# Patient Record
Sex: Female | Born: 1970 | Race: White | Hispanic: No | Marital: Married | Smoking: Never smoker
Health system: Southern US, Community
[De-identification: ages and names within clinical notes are randomized; demographics above are authoritative.]

## PROBLEM LIST (undated history)

## (undated) DIAGNOSIS — R3129 Other microscopic hematuria: Secondary | ICD-10-CM

## (undated) DIAGNOSIS — E7800 Pure hypercholesterolemia, unspecified: Secondary | ICD-10-CM

## (undated) DIAGNOSIS — I10 Essential (primary) hypertension: Secondary | ICD-10-CM

## (undated) DIAGNOSIS — R87619 Unspecified abnormal cytological findings in specimens from cervix uteri: Secondary | ICD-10-CM

## (undated) DIAGNOSIS — IMO0002 Reserved for concepts with insufficient information to code with codable children: Secondary | ICD-10-CM

## (undated) DIAGNOSIS — N029 Recurrent and persistent hematuria with unspecified morphologic changes: Secondary | ICD-10-CM

## (undated) HISTORY — PX: TUBAL LIGATION: SHX77

## (undated) HISTORY — PX: TONSILLECTOMY: SUR1361

## (undated) HISTORY — DX: Pure hypercholesterolemia, unspecified: E78.00

## (undated) HISTORY — DX: Unspecified abnormal cytological findings in specimens from cervix uteri: R87.619

## (undated) HISTORY — DX: Reserved for concepts with insufficient information to code with codable children: IMO0002

## (undated) HISTORY — DX: Essential (primary) hypertension: I10

## (undated) HISTORY — DX: Other microscopic hematuria: R31.29

## (undated) HISTORY — DX: Recurrent and persistent hematuria with unspecified morphologic changes: N02.9

---

## 1998-09-09 ENCOUNTER — Inpatient Hospital Stay (HOSPITAL_COMMUNITY): Admission: RE | Admit: 1998-09-09 | Discharge: 1998-09-11 | Payer: Self-pay | Admitting: *Deleted

## 1999-08-02 ENCOUNTER — Ambulatory Visit (HOSPITAL_COMMUNITY): Admission: RE | Admit: 1999-08-02 | Discharge: 1999-08-02 | Payer: Self-pay | Admitting: Gastroenterology

## 2000-03-14 ENCOUNTER — Encounter: Payer: Self-pay | Admitting: Orthopedic Surgery

## 2000-03-14 ENCOUNTER — Ambulatory Visit (HOSPITAL_COMMUNITY): Admission: RE | Admit: 2000-03-14 | Discharge: 2000-03-14 | Payer: Self-pay | Admitting: Orthopedic Surgery

## 2001-10-04 ENCOUNTER — Inpatient Hospital Stay (HOSPITAL_COMMUNITY): Admission: AD | Admit: 2001-10-04 | Discharge: 2001-10-04 | Payer: Self-pay | Admitting: Obstetrics and Gynecology

## 2001-10-04 ENCOUNTER — Encounter: Payer: Self-pay | Admitting: Obstetrics and Gynecology

## 2004-06-02 ENCOUNTER — Ambulatory Visit: Payer: Self-pay | Admitting: Internal Medicine

## 2004-09-06 ENCOUNTER — Ambulatory Visit (HOSPITAL_COMMUNITY): Admission: RE | Admit: 2004-09-06 | Discharge: 2004-09-06 | Payer: Self-pay | Admitting: Gastroenterology

## 2004-11-14 ENCOUNTER — Ambulatory Visit: Payer: Self-pay | Admitting: Internal Medicine

## 2004-12-06 ENCOUNTER — Ambulatory Visit: Payer: Self-pay | Admitting: Internal Medicine

## 2004-12-28 ENCOUNTER — Ambulatory Visit: Payer: Self-pay | Admitting: Internal Medicine

## 2006-08-14 ENCOUNTER — Ambulatory Visit: Payer: Self-pay | Admitting: Internal Medicine

## 2006-09-26 ENCOUNTER — Encounter: Payer: Self-pay | Admitting: Internal Medicine

## 2006-09-28 ENCOUNTER — Encounter: Payer: Self-pay | Admitting: Internal Medicine

## 2006-10-24 ENCOUNTER — Ambulatory Visit: Payer: Self-pay | Admitting: Internal Medicine

## 2006-10-29 ENCOUNTER — Encounter: Payer: Self-pay | Admitting: Internal Medicine

## 2006-11-28 ENCOUNTER — Encounter: Payer: Self-pay | Admitting: Internal Medicine

## 2007-11-03 ENCOUNTER — Encounter: Admission: RE | Admit: 2007-11-03 | Discharge: 2007-11-03 | Payer: Self-pay | Admitting: Gastroenterology

## 2008-08-17 ENCOUNTER — Ambulatory Visit: Payer: Self-pay | Admitting: Internal Medicine

## 2009-10-03 ENCOUNTER — Encounter: Payer: Self-pay | Admitting: Physician Assistant

## 2009-10-28 ENCOUNTER — Encounter: Payer: Self-pay | Admitting: Physician Assistant

## 2009-11-23 ENCOUNTER — Ambulatory Visit: Payer: Self-pay | Admitting: Nephrology

## 2010-12-15 NOTE — Op Note (Signed)
NAMECAROLE, Kathleen Espinoza           ACCOUNT NO.:  [id number]   MEDICAL RECORD NO.:  [id number]          PATIENT TYPE:  AMB   LOCATION:  ENDO                         FACILITY:  [facility]   PHYSICIAN:  Nelwyn Salisbury, M.D.  DATE OF BIRTH:  March 07, 1971   DATE OF PROCEDURE:  09/07/2003  DATE OF DISCHARGE:                                 OPERATIVE REPORT   PROCEDURE PERFORMED:  Screening colonoscopy.   ENDOSCOPIST:  Nelwyn Salisbury, M.D.   INSTRUMENT USED:  Olympus video colonoscope.   INDICATIONS FOR PROCEDURE:  A 40 year old white female with a family history  of colon cancer in her mother and maternal grandfather, undergoing a  screening colonoscopy to rule out colonic polyps, masses, etc.   PREPROCEDURE PREPARATION:  Informed consent was procured from the patient.  The patient was fasted for 8 hours prior to the procedure and prepped with a  bottle of magnesium citrate and a gallon of GoLYTELY the night prior to the  procedure.   PREPROCEDURE PHYSICAL:  VITAL SIGNS:  The patient with stable vital signs.  NECK:  Supple.  CHEST:  Clear to auscultation.  CARDIAC:  S1, S2, regular.  No murmur, rub, or gallop.  LUNGS:  No rhonchi or wheezing.  ABDOMEN:  Soft with normal bowel sounds.   DESCRIPTION OF PROCEDURE:  The patient was placed in left lateral decubitus  position, sedated with 100 mg of Demerol and 10 mg of Versed in slow  incremental doses.  Once the patient was adequately sedated and maintained  on low-flow oxygen and continuous cardiac monitoring, the Olympus video  colonoscope was advanced in the rectum, to the cecum with extreme  difficulty.  There was a large amount of residual stool in the colon.  No  masses, polyps, erosions, ulcerations, or diverticula were seen.  The  patient's position was changed from the left lateral decubitus position to  supine and the right lateral position to mobilize the stool pool and  visualize the cecal base.  However, small lesions could  have been missed.  The patient tolerated the procedure well without complications.  Retroflexion in the rectum revealed no abnormalities.   IMPRESSION:  1.  Large amount of residual stool in the colon.  Small lesions could have      been missed.  2.  No masses, polyps, or diverticula seen.   RECOMMENDATIONS:  1.  A repeat colonoscopy has been recommended in the next 3 years with a      better prep.  If the patient has any abdominal symptoms in the interim,      she is to contact the office immediately.  2.  Outpatient follow up as need arises in the future.      JNM/MEDQ  D:  09/06/2004  T:  09/06/2004  Job:  [id number]   cc:   Einar Pheasant  [address]  [place]  [place] [address]  Fax: [phone]

## 2010-12-15 NOTE — Procedures (Signed)
[place]  Patient:    Kathleen Espinoza                     MRN: [id number] Proc. Date: 08/03/99 Adm. Date:  [id number] Attending:  Juanita Craver CC:         Lavera Guise, M.D.                           Procedure Report  DATE OF BIRTH:  07-Nov-1970  REFERRING PHYSICIAN:  Lavera Guise, M.D.  PROCEDURE PERFORMED:  Colonoscopy.  ENDOSCOPIST:  Nelwyn Salisbury, M.D.  INSTRUMENT USED:  Olympus video colonoscope.  INDICATIONS FOR PROCEDURE: Family history of colon cancer in a 40 year old white female whose mother and maternal grandfather died of it at a young age.  PREPROCEDURE PREPARATION:  Informed consent was procured from the patient.  The  patient was fasted for eight hours prior to the procedure and prepped with a bottle of magnesium citrate and a gallon of NuLytely the night prior to the procedure.  PREPROCEDURE PHYSICAL:  The patient had stable vital signs.  Neck supple. Chest clear to auscultation.  S1, S2 regular.  Abdomen soft with normal abdominal bowel sounds.  DESCRIPTION OF PROCEDURE:  The patient was placed in the left lateral decubitus  position and sedated with 100 mg of Demerol and 10 mg of Versed intravenously.  Once the patient was adequately sedated and maintained on low-flow oxygen and continuous cardiac monitoring, the Olympus video colonoscope was advanced from he rectum to the cecum without difficulty.  No masses, polyps, erosions, ulcerations or hemorrhoids were seen.  There was no evidence of diverticular disease.  The patient tolerated the procedure well without complication.  IMPRESSION:  Normal colonoscopy.  RECOMMENDATIONS:  Considering her family history of colon cancer, repeat colonoscopy is recommended in the next five years unless the patient were to develop any symptoms prior to that.DD:  08/03/99 TD:  08/03/99 Job: [id number]

## 2011-11-28 ENCOUNTER — Ambulatory Visit: Payer: Self-pay | Admitting: Internal Medicine

## 2012-01-02 ENCOUNTER — Ambulatory Visit: Payer: Self-pay

## 2012-05-15 ENCOUNTER — Telehealth: Payer: Self-pay | Admitting: Internal Medicine

## 2012-05-15 NOTE — Telephone Encounter (Signed)
Pt dropped off physician wellness screening  Gave to dr Nicki Reaper

## 2012-05-16 ENCOUNTER — Encounter: Payer: Self-pay | Admitting: Internal Medicine

## 2012-05-16 ENCOUNTER — Telehealth: Payer: Self-pay | Admitting: Internal Medicine

## 2012-05-16 ENCOUNTER — Ambulatory Visit (INDEPENDENT_AMBULATORY_CARE_PROVIDER_SITE_OTHER): Payer: 59 | Admitting: Internal Medicine

## 2012-05-16 VITALS — BP 130/84 | HR 62 | Temp 98.3°F | Ht 62.0 in | Wt 206.0 lb

## 2012-05-16 DIAGNOSIS — I10 Essential (primary) hypertension: Secondary | ICD-10-CM

## 2012-05-16 NOTE — Telephone Encounter (Signed)
Left message for pt to call office

## 2012-05-16 NOTE — Telephone Encounter (Signed)
Ok to come in then - if can get here by 4:40.  i can give her a [facility] order form then.  thanks

## 2012-05-16 NOTE — Telephone Encounter (Signed)
I received a form to be completed.  I am unable to complete because I need more information.  I do not have the records from [place].  Can she come in today (at 4:15) for an evaluation and I can get height/weight, etc.  Also will be able to schedule her for the required labs.  Let me know if this time is a problem and I will work her in next week.  Thanks.

## 2012-05-16 NOTE — Patient Instructions (Addendum)
It was nice seeing you today.  We will notify you of your lab results once they become available.

## 2012-05-16 NOTE — Telephone Encounter (Signed)
Spoke with pt.  Pt stated she does not get off work till 4:30 and wanted to know if she could come after that.  She could be here @ 4:40.  Also she wanted to see if you could send lab order over to [facility] at the hospital

## 2012-05-16 NOTE — Telephone Encounter (Signed)
Pt will be here @ 4:40 today

## 2012-05-16 NOTE — Telephone Encounter (Signed)
See other message.  I do not have all the information to complete.  Can see if she can come in today 4:15 for evaluation.  Also I can get her scheduled for the required labs.  Thanks. Let me know if this time is a problem and I can work her in next week.

## 2012-05-20 ENCOUNTER — Other Ambulatory Visit: Payer: Self-pay | Admitting: Internal Medicine

## 2012-05-26 DIAGNOSIS — I10 Essential (primary) hypertension: Secondary | ICD-10-CM | POA: Insufficient documentation

## 2012-05-26 NOTE — Progress Notes (Signed)
  Subjective:    [name], female    DOB: 08-28-1970, 41 y.o.   MRN: [id number]  HPI 41 year old female with past history of hypertension who comes in today for a form completion.  Needs an evaluation for her work.  States she has been doing well.  Blood pressure has been doing well.  Working first shift now.  Eating and drinking well.    Review of Systems Patient denies any headache, lightheadedness or dizziness.  No chest pain, tightness or palpitations.  No increased shortness of breath, cough or congestion.  No nausea or vomiting.  No abdominal pain or cramping.  No bowel change, such as diarrhea, constipation, BRBPR or melana.  No urine change.        Objective:   Physical Exam Filed Vitals:   05/16/12 1653  BP: 130/84  Pulse: 62  Temp: 98.3 F (40.42 C)   41 year old female in no acute distress.  NECK:  Supple, nontender.  No audible bruit.   HEART:  Appears to be regular. LUNGS:  Without crackles or wheezing audible.  Respirations even and unlabored.   RADIAL PULSE:  Equal bilaterally.  ABDOMEN:  Soft, nontender.  No audible abdominal bruit.   EXTREMITIES:  No increased edema to be present.                     Assessment & Plan:  FORM COMPLETION.   Obtain labs for her work.  Will need these to complete her form.

## 2012-05-26 NOTE — Assessment & Plan Note (Signed)
Blood pressure has been under good control.  Have her continue to spot check her pressures.  Follow.  Check met b.

## 2012-05-27 ENCOUNTER — Telehealth: Payer: Self-pay | Admitting: *Deleted

## 2012-05-27 LAB — BASIC METABOLIC PANEL WITH GFR
BUN/Creatinine Ratio: 15 (ref 9–23)
BUN: 13 mg/dL (ref 6–24)
CO2: 19 mmol/L (ref 19–28)
Calcium: 9.3 mg/dL (ref 8.7–10.2)
Chloride: 102 mmol/L (ref 97–108)
Creatinine, Ser: 0.87 mg/dL (ref 0.57–1.00)
GFR calc Af Amer: 96 mL/min/1.73
GFR calc non Af Amer: 84 mL/min/1.73
Glucose: 112 mg/dL — ABNORMAL HIGH (ref 65–99)
Potassium: 4.2 mmol/L (ref 3.5–5.2)
Sodium: 138 mmol/L (ref 134–144)

## 2012-05-27 LAB — LIPID PANEL W/O CHOL/HDL RATIO
Cholesterol, Total: 187 mg/dL (ref 100–199)
HDL: 56 mg/dL (ref >39)
LDL Calculated: 105 mg/dL — ABNORMAL HIGH (ref 0–99)
Triglycerides: 131 mg/dL (ref 0–149)
VLDL Cholesterol Cal: 26 mg/dL (ref 5–40)

## 2012-05-27 LAB — NICOTINE/COTININE METABOLITES
Cotinine: 1.3 ng/mL
Nicotine: NOT DETECTED ng/mL

## 2012-05-27 LAB — HGB A1C W/O EAG: Hgb A1c MFr Bld: 5.6 % (ref 4.8–5.6)

## 2012-05-27 NOTE — Telephone Encounter (Signed)
Pt called back and said she talked with [facility] and they said they faxed her labs over sometime last week.

## 2012-05-27 NOTE — Telephone Encounter (Signed)
Called patient at home to check on the progress of her labs. Patient stated that she has not heard anything and that she will call to inquire about them today.

## 2012-05-28 NOTE — Telephone Encounter (Signed)
Pt would like a call back concerning her lab work best number [phone].

## 2012-05-29 ENCOUNTER — Telehealth: Payer: Self-pay | Admitting: *Deleted

## 2012-05-29 NOTE — Telephone Encounter (Signed)
Called patient at home and left message for her to return call.

## 2012-05-29 NOTE — Telephone Encounter (Signed)
Patients husband came by office and picked up patients labs.

## 2012-05-29 NOTE — Telephone Encounter (Signed)
Patients husband dropped by office and picked up patient's lab form.

## 2012-05-29 NOTE — Telephone Encounter (Signed)
Have patient's labs ready. Left message for patient to return call.

## 2012-06-02 ENCOUNTER — Encounter: Payer: Self-pay | Admitting: Internal Medicine

## 2012-06-02 ENCOUNTER — Ambulatory Visit (INDEPENDENT_AMBULATORY_CARE_PROVIDER_SITE_OTHER): Payer: 59 | Admitting: Internal Medicine

## 2012-06-02 VITALS — BP 118/80 | HR 84 | Temp 97.1°F | Ht 64.0 in | Wt 208.0 lb

## 2012-06-02 DIAGNOSIS — M79606 Pain in leg, unspecified: Secondary | ICD-10-CM

## 2012-06-02 DIAGNOSIS — M79609 Pain in unspecified limb: Secondary | ICD-10-CM

## 2012-06-02 DIAGNOSIS — Q619 Cystic kidney disease, unspecified: Secondary | ICD-10-CM

## 2012-06-02 DIAGNOSIS — N281 Cyst of kidney, acquired: Secondary | ICD-10-CM | POA: Insufficient documentation

## 2012-06-02 DIAGNOSIS — I10 Essential (primary) hypertension: Secondary | ICD-10-CM

## 2012-06-02 DIAGNOSIS — R319 Hematuria, unspecified: Secondary | ICD-10-CM | POA: Insufficient documentation

## 2012-06-02 DIAGNOSIS — E7800 Pure hypercholesterolemia, unspecified: Secondary | ICD-10-CM | POA: Insufficient documentation

## 2012-06-02 NOTE — Patient Instructions (Addendum)
It was good seeing you today.  I am sorry your legs have been bothering you.  I am going to check a few labs (inflammatory markers, etc).  We will notify you of the results once they are available.

## 2012-06-02 NOTE — Assessment & Plan Note (Signed)
Followed by nephrology. 

## 2012-06-02 NOTE — Assessment & Plan Note (Signed)
Blood pressure is doing well.  Follow met b.  Same meds.

## 2012-06-02 NOTE — Progress Notes (Signed)
  Subjective:    [name], female    DOB: 1970/09/05, 41 y.o.   MRN: [id number]  HPI 41 year old female with past history of hypertension who comes in today for a scheduled follow up.  She states that she has been having increased pain in her leg.  Present now for a few months.  Started after playing softball.  No known injury or trauma.  The acute pain resolved, but she continues to have what she describes as growing pains.  Bothers her to walk up stairs.  Feels like her legs are gong to give out when she is going down stairs.  Feels more like a muscle ache.  No involvement of her upper extremities.  No fever.  No rash.  No other joint complaints.    Past Medical History  Diagnosis Date  . Hypertension   . Hypercholesterolemia   . Abnormal pap     required freezing  . Microscopic hematuria     s/p renal biopsy  . Thin basement membrane disease     Review of Systems Patient denies any headache, lightheadedness or dizziness.  No chest pain, tightness or palpitations.  No increased shortness of breath, cough or congestion.  No nausea or vomiting.  No abdominal pain or cramping.  No bowel change, such as diarrhea, constipation, BRBPR or melana.  No urine change. Leg aching as outlined.          Objective:   Physical Exam Filed Vitals:   06/02/12 1618  BP: 118/80  Pulse: 84  Temp: 97.1 F (36.80 C)   41 year old female in no acute distress.   HEENT:  Nares - clear.  OP- without lesions or erythema.  NECK:  Supple, nontender.  No audible bruit.   HEART:  Appears to be regular. LUNGS:  Without crackles or wheezing audible.  Respirations even and unlabored.   RADIAL PULSE:  Equal bilaterally.  ABDOMEN:  Soft, nontender.  No audible abdominal bruit.   EXTREMITIES:  No increased edema to be present.  DP pulses palpable and equal bilaterally.  MSK.  No pain with straight leg raise.  No pain to palpation over the muscle.  No rash.  Motor strength appears to be equal bilateral  lower extremities.                    Assessment & Plan:  LEG PAIN.  Symptoms and exam as outlined.  No injury or trauma.  Check ESR and CRP.  Will also check CK.  Further w/up pending results.    HEALTH MAINTENANCE.  Physical 11/21/11.  Colonoscopy 10/31/07 was normal except for extrinsic compression from the cecum.  This was performed by Dr Collene Mares ([place]).  CT revealed no acute findings - no cecal mass.  Mammogram 11/28/11 - BiRADS II. Had a follow up pap 12/27/11.  Obtain results.  (previous pap unsatisfactory - positive HPV).

## 2012-06-02 NOTE — Assessment & Plan Note (Signed)
Low cholesterol diet and exercise.  Follow  Not on any medication.   

## 2012-06-02 NOTE — Assessment & Plan Note (Signed)
Sees nephrology.  Presumably felt to be due to thin basement membrane disease.  Negative renal biopsy.

## 2012-06-03 NOTE — Progress Notes (Signed)
Patients husband came by to pick up.

## 2012-06-05 ENCOUNTER — Telehealth: Payer: Self-pay | Admitting: Internal Medicine

## 2012-06-05 NOTE — Telephone Encounter (Signed)
Pt has to wait until Monday to have her labs but she was wondering if there was anything she could be per scribed to help with the pain in her legs. She uses [facility] on [address].

## 2012-06-05 NOTE — Telephone Encounter (Signed)
I would stay with tylenol arthritis 2 tablets bid.  If this does not work -(pending labs) can try something else.  Avoid antiiflammatories.

## 2012-06-06 NOTE — Telephone Encounter (Signed)
Pt called back and I told her the message that Dr. Nicki Reaper had written.

## 2012-06-10 ENCOUNTER — Other Ambulatory Visit: Payer: Self-pay | Admitting: Internal Medicine

## 2012-06-12 LAB — CK: Total CK: 61 U/L (ref 24–173)

## 2012-06-12 LAB — BASIC METABOLIC PANEL
BUN: 12 mg/dL (ref 6–24)
CO2: 26 mmol/L (ref 19–28)
Calcium: 9.3 mg/dL (ref 8.7–10.2)
Chloride: 101 mmol/L (ref 97–108)
Creatinine, Ser: 0.85 mg/dL (ref 0.57–1.00)
Glucose: 117 mg/dL — ABNORMAL HIGH (ref 65–99)

## 2012-06-12 LAB — CBC WITH DIFFERENTIAL
Basophils Absolute: 0.1 10*3/uL (ref 0.0–0.2)
Eos: 2 % (ref 0–5)
HCT: 41.2 % (ref 34.0–46.6)
Immature Granulocytes: 0 % (ref 0–2)
Lymphs: 27 % (ref 14–46)
MCV: 86 fL (ref 79–97)
Monocytes: 9 % (ref 4–12)
Platelets: 310 10*3/uL (ref 155–379)
RBC: 4.82 x10E6/uL (ref 3.77–5.28)
RDW: 12.6 % (ref 12.3–15.4)
WBC: 8.6 10*3/uL (ref 3.4–10.8)

## 2012-06-12 LAB — SEDIMENTATION RATE: Sed Rate: 7 mm/hr (ref 0–32)

## 2012-06-12 LAB — HEPATIC FUNCTION PANEL
Albumin: 4 g/dL (ref 3.5–5.5)
Total Bilirubin: 0.4 mg/dL (ref 0.0–1.2)
Total Protein: 6.6 g/dL (ref 6.0–8.5)

## 2012-06-12 LAB — C-REACTIVE PROTEIN: CRP: 8.6 mg/L — ABNORMAL HIGH (ref 0.0–4.9)

## 2012-06-13 ENCOUNTER — Telehealth: Payer: Self-pay | Admitting: Internal Medicine

## 2012-06-13 NOTE — Telephone Encounter (Signed)
Pt would like a call back from a nurse to discuss lab results

## 2012-06-13 NOTE — Telephone Encounter (Signed)
Patient called and is concerned about CRP levels. (8.6) I explained that you had already sent her the results for her labs, But she wanted to check with you to make sure everything was all right.

## 2012-06-13 NOTE — Telephone Encounter (Signed)
Called pt.  Left message on her voice mail with information about CRP and again informed her that I wanted to refer her to rheumatology for evaluation.  Left message for her to call back and let me know if she is agreeable.

## 2012-06-13 NOTE — Telephone Encounter (Signed)
Labs reviewed.  Message sent to pt with results.

## 2012-06-17 ENCOUNTER — Telehealth: Payer: Self-pay | Admitting: Internal Medicine

## 2012-06-17 NOTE — Telephone Encounter (Signed)
Patient wants to go to be referred to a rheumatology ,but not [facility]. She would like to go to [facility].

## 2012-06-17 NOTE — Telephone Encounter (Signed)
Does she have a name of someone she would like to see at [facility].  I do not know the rheumatologist there.  Let me know and I can make the referral.

## 2012-06-18 NOTE — Telephone Encounter (Signed)
The patient called back and it does not matter who she is referred to as long as it's not Dr. Jefm Bryant.

## 2012-06-18 NOTE — Telephone Encounter (Signed)
Please refer pt to Dr Hardie [facility] rheumatology) - phone [phone].  She is having continued leg pain, aching and elevated CRP.  Thanks.

## 2012-06-20 NOTE — Telephone Encounter (Signed)
They require the Dr.'s office to fax over notes and they review before scheduling the patient. I have faxed the notes and demographic information to  [phone].

## 2012-06-20 NOTE — Telephone Encounter (Signed)
Noted.  Please notify pt we have faxed over records and waiting to hear about appt.  Tell her to let us know if she does not hear.

## 2012-06-20 NOTE — Telephone Encounter (Signed)
Called patient to let her know. She said would give them a few days before she called back.

## 2012-07-04 ENCOUNTER — Telehealth: Payer: Self-pay | Admitting: Internal Medicine

## 2012-07-04 NOTE — Telephone Encounter (Signed)
Refill request for lisino-hctz 20-12.5 Tab Qty: 30 Sig: take one tablet by mouth every day

## 2012-07-10 MED ORDER — LISINOPRIL-HYDROCHLOROTHIAZIDE 20-12.5 MG PO TABS: 1.0000 | ORAL_TABLET | Freq: Every day | ORAL | Status: DC

## 2012-07-10 NOTE — Telephone Encounter (Signed)
Sent in to pharmacy.  

## 2012-07-16 ENCOUNTER — Ambulatory Visit: Payer: Self-pay | Admitting: Internal Medicine

## 2012-07-31 ENCOUNTER — Telehealth: Payer: Self-pay | Admitting: Internal Medicine

## 2012-07-31 NOTE — Telephone Encounter (Signed)
Pt coming tomorrow for repeat pap.

## 2012-07-31 NOTE — Telephone Encounter (Signed)
Please advice? She has appointment on 1/3 and also on 1/22

## 2012-07-31 NOTE — Telephone Encounter (Signed)
Patient is wanting to know why she has to follow up with the Dr. About old pap results.

## 2012-08-01 ENCOUNTER — Encounter: Payer: Self-pay | Admitting: Internal Medicine

## 2012-08-01 ENCOUNTER — Ambulatory Visit (INDEPENDENT_AMBULATORY_CARE_PROVIDER_SITE_OTHER): Payer: 59 | Admitting: Internal Medicine

## 2012-08-01 ENCOUNTER — Other Ambulatory Visit (HOSPITAL_COMMUNITY)
Admission: RE | Admit: 2012-08-01 | Discharge: 2012-08-01 | Disposition: A | Discharge status: Home / Self Care | Payer: 59 | Source: Ambulatory Visit | Attending: Internal Medicine | Admitting: Internal Medicine

## 2012-08-01 VITALS — BP 112/70 | HR 89 | Temp 98.8°F | Ht 64.0 in | Wt 217.8 lb

## 2012-08-01 DIAGNOSIS — Z1151 Encounter for screening for human papillomavirus (HPV): Secondary | ICD-10-CM | POA: Insufficient documentation

## 2012-08-01 DIAGNOSIS — Z139 Encounter for screening, unspecified: Secondary | ICD-10-CM

## 2012-08-01 DIAGNOSIS — Z01419 Encounter for gynecological examination (general) (routine) without abnormal findings: Secondary | ICD-10-CM | POA: Insufficient documentation

## 2012-08-01 DIAGNOSIS — I10 Essential (primary) hypertension: Secondary | ICD-10-CM

## 2012-08-03 ENCOUNTER — Encounter: Payer: Self-pay | Admitting: Internal Medicine

## 2012-08-03 NOTE — Assessment & Plan Note (Signed)
Blood pressure under good control.  Same medication regimen.  Follow.

## 2012-08-03 NOTE — Progress Notes (Signed)
  Subjective:    [name], female    DOB: 03-24-1971, 42 y.o.   MRN: [id number]  HPI 42 year old female with past history of hypertension who comes in today for a scheduled follow up.  She is here for a follow up pap smear.  Pap 11/21/11 unsatisfactory with positive HPV.  Repeat pap 12/25/11 revealed pap negative with positive HPV.  She returns today for a follow up HPV.  She states she has been doing relatively well.  Still with increased joint complaints.  Planning to see rheumatology 08/08/12.  She is having regular periods, but states she has noticed now that when she her period is complete - she will still have some brown discharge.  Period ended one week ago.  Blood pressure has been doing well.     Past Medical History  Diagnosis Date  . Hypertension   . Hypercholesterolemia   . Abnormal pap     required freezing  . Microscopic hematuria     s/p renal biopsy  . Thin basement membrane disease     Current Outpatient Prescriptions on File Prior to Visit  Medication Sig Dispense Refill  . lisinopril-hydrochlorothiazide (PRINZIDE,ZESTORETIC) 20-12.5 MG per tablet Take 1 tablet by mouth daily.  30 tablet  2    Review of Systems Patient denies any headache, lightheadedness or dizziness.  No chest pain, tightness or palpitations.  No increased shortness of breath, cough or congestion.  No nausea or vomiting.  No abdominal pain or cramping.  No bowel change, such as diarrhea, constipation, BRBPR or melana.  No urine change. Leg aching as outlined.          Objective:   Physical Exam  Filed Vitals:   08/01/12 1629  BP: 112/70  Pulse: 89  Temp: 98.8 F (55.26 C)   42 year old female in no acute distress.   HEENT:  Nares - clear.  OP- without lesions or erythema.  NECK:  Supple, nontender.  No audible bruit.   HEART:  Appears to be regular. LUNGS:  Without crackles or wheezing audible.  Respirations even and unlabored.   RADIAL PULSE:  Equal bilaterally.  ABDOMEN:   Soft, nontender.  No audible abdominal bruit.  GU:  Normal external genitalia.  Vaginal vault without lesions.  Some brown discharge present.  Cervix identified - no lesions.  Pap performed.  Could not appreciate any adnexal masses or tenderness.    EXTREMITIES:  No increased edema to be present.                  Assessment & Plan:  MSK.  Joint pain and stiffness.  Planning to see rheumatology 08/08/12.    ABNORMAL PAP.  Repeated today.  Last pap - positive HPV.     HEALTH MAINTENANCE.  Physical 11/21/11.  Colonoscopy 10/31/07 was normal except for extrinsic compression from the cecum.  This was performed by Dr Collene Mares ([place]).  CT revealed no acute findings - no cecal mass.  Mammogram 11/28/11 - BiRADS II.  Repeat pap today.

## 2012-08-06 ENCOUNTER — Telehealth: Payer: Self-pay | Admitting: Internal Medicine

## 2012-08-06 NOTE — Telephone Encounter (Signed)
Pt notified via my chart - pap ok.  HPV negative.

## 2012-08-19 ENCOUNTER — Encounter: Payer: Self-pay | Admitting: Internal Medicine

## 2012-08-20 ENCOUNTER — Ambulatory Visit: Payer: 59 | Admitting: Internal Medicine

## 2012-09-13 ENCOUNTER — Other Ambulatory Visit: Payer: Self-pay

## 2012-11-21 ENCOUNTER — Other Ambulatory Visit: Payer: Self-pay | Admitting: *Deleted

## 2012-11-21 MED ORDER — LISINOPRIL-HYDROCHLOROTHIAZIDE 20-12.5 MG PO TABS: 1.0000 | ORAL_TABLET | Freq: Every day | ORAL | Status: DC

## 2013-01-15 ENCOUNTER — Ambulatory Visit: Payer: Self-pay | Admitting: Internal Medicine

## 2013-02-03 ENCOUNTER — Encounter: Payer: Self-pay | Admitting: Internal Medicine

## 2013-02-13 ENCOUNTER — Encounter: Payer: Self-pay | Admitting: Internal Medicine

## 2013-03-16 ENCOUNTER — Encounter: Payer: Self-pay | Admitting: Internal Medicine

## 2013-03-16 ENCOUNTER — Ambulatory Visit (INDEPENDENT_AMBULATORY_CARE_PROVIDER_SITE_OTHER): Payer: 59 | Admitting: Internal Medicine

## 2013-03-16 VITALS — BP 100/60 | HR 78 | Temp 98.8°F | Ht 64.0 in | Wt 229.5 lb

## 2013-03-16 DIAGNOSIS — N760 Acute vaginitis: Secondary | ICD-10-CM

## 2013-03-16 DIAGNOSIS — N390 Urinary tract infection, site not specified: Secondary | ICD-10-CM

## 2013-03-16 DIAGNOSIS — I10 Essential (primary) hypertension: Secondary | ICD-10-CM

## 2013-03-16 LAB — POCT URINALYSIS DIPSTICK
Ketones, UA: NEGATIVE
Protein, UA: NEGATIVE
Urobilinogen, UA: 0.2
pH, UA: 5.5

## 2013-03-16 MED ORDER — NYSTATIN 100000 UNIT/GM EX CREA: TOPICAL_CREAM | Freq: Two times a day (BID) | CUTANEOUS | Status: DC

## 2013-03-18 ENCOUNTER — Encounter: Payer: Self-pay | Admitting: Internal Medicine

## 2013-03-18 ENCOUNTER — Telehealth: Payer: Self-pay | Admitting: *Deleted

## 2013-03-18 LAB — WET PREP BY MOLECULAR PROBE: Trichomonas vaginosis: NEGATIVE

## 2013-03-18 LAB — URINE CULTURE: Colony Count: 4000

## 2013-03-18 MED ORDER — FLUCONAZOLE 150 MG PO TABS: 150.0000 mg | ORAL_TABLET | Freq: Once | ORAL | Status: DC

## 2013-03-18 NOTE — Progress Notes (Signed)
  Subjective:    [name], female    DOB: [birth year], 42 y.o.   MRN: [id number]  Vaginal Pain  Vaginal Itching  42 year old female with past history of hypertension who comes in today as a work in with concerns regarding increased vaginal burning and itching.  Some burning with urination.  States she feels the burning sensation when the urine touches the vaginal area.  No abdominal pain or cramping.  No back pain.  Eating and drinking well.  Symptoms started approximately five days ago.  She just had her colonoscopy.  States everything checked out fine.  Recommended follow up colonoscopy in five years.      Past Medical History  Diagnosis Date  . Hypertension   . Hypercholesterolemia   . Abnormal pap     required freezing  . Microscopic hematuria     s/p renal biopsy  . Thin basement membrane disease     Current Outpatient Prescriptions on File Prior to Visit  Medication Sig Dispense Refill  . lisinopril-hydrochlorothiazide (PRINZIDE,ZESTORETIC) 20-12.5 MG per tablet Take 1 tablet by mouth daily.  30 tablet  5   No current facility-administered medications on file prior to visit.    Review of Systems  Genitourinary: Positive for vaginal pain.  No fever.  No nausea or vomiting.  No abdominal pain or cramping.  No bowel change, such as diarrhea, constipation, BRBPR or melena.  No urine change.  She does report the burning sensation when she urinates, but states this is felt when the urine touches the vaginal tissue.  Vaginal burning and irritation as outlined.           Objective:   Physical Exam  Filed Vitals:   03/16/13 1428  BP: 100/60  Pulse: 78  Temp: 98.8 F (48.54 C)   42 year old female in no acute distress.   HEART:  Appears to be regular. LUNGS:  Without crackles or wheezing audible.  Respirations even and unlabored.   RADIAL PULSE:  Equal bilaterally.  ABDOMEN:  Soft, nontender.  No audible abdominal bruit.  GU:  Normal external genitalia.  Some  vaginal redness.  Vaginal vault without lesions.  Cervix identified - no lesions.  Could not appreciate any adnexal masses or tenderness.  KOH/wet prep sent.    EXTREMITIES:  No increased edema to be present.                  Assessment & Plan:  GU.  Urinalysis obtained.  Will send for culture.  Hold abx.  Some vaginal irritation and redness.  Treat with nystatin cream externally.  KOH/wet prep sent.  Await results.     ABNORMAL PAP.  Has a history of positive HPV.  Last pap 1/14 - negative with negative HPV.     HEALTH MAINTENANCE.  Last pap 1/14 - negative with negative HPV.   Colonoscopy 10/31/07 was normal except for extrinsic compression from the cecum.  This was performed by Dr Collene Mares ([place]).  CT revealed no acute findings - no cecal mass.  Just had repeat colonoscopy - negative.  Recommended follow up colonoscopy in five years.  Mammogram 01/15/13 - BiRADS II.

## 2013-03-18 NOTE — Telephone Encounter (Signed)
Pt called back, I gave her her labs results that was sent to her mychart (unread) & sent in one Dilfucan 150mg  #1

## 2013-03-18 NOTE — Assessment & Plan Note (Signed)
Blood pressure under good control.  Same medication regimen.  Follow.

## 2013-03-18 NOTE — Telephone Encounter (Signed)
noted 

## 2013-03-23 ENCOUNTER — Telehealth: Payer: Self-pay | Admitting: *Deleted

## 2013-03-23 MED ORDER — FLUCONAZOLE 150 MG PO TABS: 150.0000 mg | ORAL_TABLET | Freq: Once | ORAL | Status: DC

## 2013-03-23 NOTE — Telephone Encounter (Signed)
I sent in rx for diflucan.  If she has persistent problems, let us know.

## 2013-03-23 NOTE — Telephone Encounter (Signed)
Pt reports that she still has a yeast infection (not as bad as before)-feels that she needs a second Dilfucan to clear it completely

## 2013-03-24 ENCOUNTER — Encounter: Payer: Self-pay | Admitting: *Deleted

## 2013-03-24 NOTE — Telephone Encounter (Signed)
Sent mychart message

## 2013-04-27 ENCOUNTER — Ambulatory Visit (INDEPENDENT_AMBULATORY_CARE_PROVIDER_SITE_OTHER): Payer: 59 | Admitting: Internal Medicine

## 2013-04-27 ENCOUNTER — Encounter: Payer: Self-pay | Admitting: Internal Medicine

## 2013-04-27 VITALS — BP 100/70 | HR 71 | Temp 97.8°F | Ht 64.0 in | Wt 225.2 lb

## 2013-04-27 DIAGNOSIS — I10 Essential (primary) hypertension: Secondary | ICD-10-CM

## 2013-04-27 DIAGNOSIS — E7800 Pure hypercholesterolemia, unspecified: Secondary | ICD-10-CM

## 2013-04-27 DIAGNOSIS — R739 Hyperglycemia, unspecified: Secondary | ICD-10-CM | POA: Insufficient documentation

## 2013-04-27 DIAGNOSIS — N281 Cyst of kidney, acquired: Secondary | ICD-10-CM

## 2013-04-27 DIAGNOSIS — R7309 Other abnormal glucose: Secondary | ICD-10-CM

## 2013-04-27 DIAGNOSIS — Q619 Cystic kidney disease, unspecified: Secondary | ICD-10-CM

## 2013-04-27 DIAGNOSIS — R319 Hematuria, unspecified: Secondary | ICD-10-CM

## 2013-04-27 MED ORDER — CLOTRIMAZOLE-BETAMETHASONE 1-0.05 % EX CREA: TOPICAL_CREAM | Freq: Two times a day (BID) | CUTANEOUS | Status: DC

## 2013-04-27 MED ORDER — LISINOPRIL-HYDROCHLOROTHIAZIDE 20-12.5 MG PO TABS: 1.0000 | ORAL_TABLET | Freq: Every day | ORAL | Status: DC

## 2013-04-27 NOTE — Assessment & Plan Note (Signed)
Sees nephrology.  Presumably felt to be due to thin basement membrane disease.  Negative renal biopsy.  Renal function stable.

## 2013-04-27 NOTE — Assessment & Plan Note (Signed)
Low cholesterol diet and exercise.  Follow.  Not on any medication.

## 2013-04-27 NOTE — Assessment & Plan Note (Signed)
Blood pressure under good control.  Same medication regimen.  Follow.

## 2013-04-27 NOTE — Progress Notes (Signed)
  Subjective:    [name], female    DOB: 10-31-1970, 42 y.o.   MRN: [id number]  HPI 42 year old female with past history of hypertension who comes in today for a scheduled follow up.  She states she has been doing relatively well. Has lost some weight.  Watching her carbs.  Blood pressure doing well.  Has not started exercising yet.  Feels better since she has adjusted her diet and has lost some weight.  Bowels stable.  No vaginal complaints.     Past Medical History  Diagnosis Date  . Hypertension   . Hypercholesterolemia   . Abnormal pap     required freezing  . Microscopic hematuria     s/p renal biopsy  . Thin basement membrane disease     Outpatient Encounter Prescriptions as of 04/27/2013  Medication Sig Dispense Refill  . lisinopril-hydrochlorothiazide (PRINZIDE,ZESTORETIC) 20-12.5 MG per tablet Take 1 tablet by mouth daily.  90 tablet  1  . [DISCONTINUED] lisinopril-hydrochlorothiazide (PRINZIDE,ZESTORETIC) 20-12.5 MG per tablet Take 1 tablet by mouth daily.  30 tablet  5  . clotrimazole-betamethasone (LOTRISONE) cream Apply topically 2 (two) times daily.  30 g  0  . [DISCONTINUED] fluconazole (DIFLUCAN) 150 MG tablet Take 1 tablet (150 mg total) by mouth once.  1 tablet  0  . [DISCONTINUED] nystatin cream (MYCOSTATIN) Apply topically 2 (two) times daily.  30 g  0   No facility-administered encounter medications on file as of 04/27/2013.     Review of Systems Patient denies any headache, lightheadedness or dizziness.  No sinus or allergy symptoms.  No chest pain, tightness or palpitations.  No increased shortness of breath, cough or congestion.  No nausea or vomiting.  No acid reflux.  No abdominal pain or cramping.  No bowel change, such as diarrhea, constipation, BRBPR or melana.  No urine change.  Has cut back carbs.  Losing some weight.  Overall feels better.          Objective:   Physical Exam  Filed Vitals:   04/27/13 0811  BP: 100/70  Pulse: 71   Temp: 97.8 F (36.6 C)   Blood pressure recheck:  116/76, pulse 52  42 year old female in no acute distress.   HEENT:  Nares - clear.  OP- without lesions or erythema.  NECK:  Supple, nontender.  No audible bruit.   HEART:  Appears to be regular. LUNGS:  Without crackles or wheezing audible.  Respirations even and unlabored.   RADIAL PULSE:  Equal bilaterally.  ABDOMEN:  Soft, nontender.  No audible abdominal bruit.    EXTREMITIES:  No increased edema to be present.  FEET:  No lesions.                  Assessment & Plan:  MSK.  Saw rheumatology 08/08/12.  Stable.  Feels better.   HEALTH MAINTENANCE.  Physical 1/14 - negative with negative HPV.  Colonoscopy 10/31/07 was normal except for extrinsic compression from the cecum.  This was performed by Dr Collene Mares ([place]).  CT revealed no acute findings - no cecal mass.  Mammogram 01/15/13 - BiRADS II.

## 2013-04-27 NOTE — Assessment & Plan Note (Signed)
A1c 6.3.  Has adjusted her diet.  Plans to start exercising.  Discussed [facility] referral.  She wants to work on her own regarding weight loss and diet adjustment.

## 2013-04-27 NOTE — Assessment & Plan Note (Signed)
Followed by nephrology. 

## 2013-06-04 ENCOUNTER — Other Ambulatory Visit: Payer: Self-pay

## 2013-08-04 ENCOUNTER — Encounter: Payer: 59 | Admitting: Internal Medicine

## 2013-08-19 ENCOUNTER — Other Ambulatory Visit: Payer: Self-pay | Admitting: Internal Medicine

## 2013-08-20 ENCOUNTER — Encounter: Payer: Self-pay | Admitting: *Deleted

## 2013-08-25 NOTE — Telephone Encounter (Signed)
Mailed unread message to pt  

## 2013-09-17 ENCOUNTER — Other Ambulatory Visit (HOSPITAL_COMMUNITY)
Admission: RE | Admit: 2013-09-17 | Discharge: 2013-09-17 | Disposition: A | Discharge status: Home / Self Care | Payer: 59 | Source: Ambulatory Visit | Attending: Internal Medicine | Admitting: Internal Medicine

## 2013-09-17 ENCOUNTER — Ambulatory Visit (INDEPENDENT_AMBULATORY_CARE_PROVIDER_SITE_OTHER): Payer: 59 | Admitting: Internal Medicine

## 2013-09-17 ENCOUNTER — Encounter: Payer: Self-pay | Admitting: Internal Medicine

## 2013-09-17 VITALS — BP 110/70 | HR 67 | Temp 98.1°F | Ht 62.5 in | Wt 224.5 lb

## 2013-09-17 DIAGNOSIS — R319 Hematuria, unspecified: Secondary | ICD-10-CM

## 2013-09-17 DIAGNOSIS — N926 Irregular menstruation, unspecified: Secondary | ICD-10-CM

## 2013-09-17 DIAGNOSIS — Z01419 Encounter for gynecological examination (general) (routine) without abnormal findings: Secondary | ICD-10-CM | POA: Insufficient documentation

## 2013-09-17 DIAGNOSIS — R21 Rash and other nonspecific skin eruption: Secondary | ICD-10-CM

## 2013-09-17 DIAGNOSIS — N281 Cyst of kidney, acquired: Secondary | ICD-10-CM

## 2013-09-17 DIAGNOSIS — E7800 Pure hypercholesterolemia, unspecified: Secondary | ICD-10-CM

## 2013-09-17 DIAGNOSIS — L989 Disorder of the skin and subcutaneous tissue, unspecified: Secondary | ICD-10-CM

## 2013-09-17 DIAGNOSIS — I10 Essential (primary) hypertension: Secondary | ICD-10-CM

## 2013-09-17 DIAGNOSIS — Z1151 Encounter for screening for human papillomavirus (HPV): Secondary | ICD-10-CM | POA: Insufficient documentation

## 2013-09-17 DIAGNOSIS — Q619 Cystic kidney disease, unspecified: Secondary | ICD-10-CM

## 2013-09-17 DIAGNOSIS — Z124 Encounter for screening for malignant neoplasm of cervix: Secondary | ICD-10-CM

## 2013-09-17 DIAGNOSIS — R7309 Other abnormal glucose: Secondary | ICD-10-CM

## 2013-09-17 DIAGNOSIS — R739 Hyperglycemia, unspecified: Secondary | ICD-10-CM

## 2013-09-17 NOTE — Progress Notes (Signed)
Pre-visit discussion using our clinic review tool. No additional management support is needed unless otherwise documented below in the visit note.  

## 2013-09-17 NOTE — Progress Notes (Signed)
  Subjective:    [name], female    DOB: 01-24-1971, 43 y.o.   MRN: [id number]  HPI 43 year old female with past history of hypertension who comes in today to follow up on these issues as well as for a complete physical exam.  She states she has been doing relatively well. Has lost some weight.  Watching her carbs.  Blood pressure doing well.  Started exercising.  Feels better since she has adjusted her diet and has lost some weight.  Bowels stable.  She does report persistent vaginal discharge or bleeding most days of the month.  Will have her period, but then will have discharge or bleeding/spotting - most other days.     Past Medical History  Diagnosis Date  . Hypertension   . Hypercholesterolemia   . Abnormal pap     required freezing  . Microscopic hematuria     s/p renal biopsy  . Thin basement membrane disease     Outpatient Encounter Prescriptions as of 09/17/2013  Medication Sig  . clotrimazole-betamethasone (LOTRISONE) cream Apply topically 2 (two) times daily.  Marland Kitchen lisinopril-hydrochlorothiazide (PRINZIDE,ZESTORETIC) 20-12.5 MG per tablet Take 1 tablet by mouth  daily     Review of Systems Patient denies any headache, lightheadedness or dizziness.  No sinus or allergy symptoms.  No chest pain, tightness or palpitations.  No increased shortness of breath, cough or congestion.  No nausea or vomiting.  No acid reflux.  No abdominal pain or cramping.  No bowel change, such as diarrhea, constipation, BRBPR or melana.  No urine change.  Has cut back carbs.  Losing some weight.  Overall feels better.  Vaginal symptoms as outlined.           Objective:   Physical Exam  Filed Vitals:   09/17/13 1424  BP: 110/70  Pulse: 67  Temp: 98.1 F (36.7 C)   Blood pressure recheck:  40/67  43 year old female in no acute distress.   HEENT:  Nares- clear.  Oropharynx - without lesions. NECK:  Supple.  Nontender.  No audible bruit.  HEART:  Appears to be regular. LUNGS:   No crackles or wheezing audible.  Respirations even and unlabored.  RADIAL PULSE:  Equal bilaterally.    BREASTS:  No nipple discharge or nipple retraction present.  Could not appreciate any distinct nodules or axillary adenopathy.  ABDOMEN:  Soft, nontender.  Bowel sounds present and normal.  No audible abdominal bruit.  GU:  Normal external genitalia.  Vaginal vault without lesions.  Cervix identified.  Pap performed. Could not appreciate any adnexal masses or tenderness.  EXTREMITIES:  No increased edema present.  DP pulses palpable and equal bilaterally.            Assessment & Plan:  MSK.  Saw rheumatology previously.  Stable.  Feels better.   DERMATOLOGY.  Persistent left lower leg lesion and abdominal rash.  Refer to dermatology for further evaluation.    HEALTH MAINTENANCE.  Physical today.  Pa p today.  Colonoscopy 10/31/07 was normal except for extrinsic compression from the cecum.  This was performed by Dr Collene Mares ([place]).  CT revealed no acute findings - no cecal mass.  Mammogram 01/15/13 - BiRADS II.

## 2013-09-20 ENCOUNTER — Encounter: Payer: Self-pay | Admitting: Internal Medicine

## 2013-09-20 DIAGNOSIS — N926 Irregular menstruation, unspecified: Secondary | ICD-10-CM | POA: Insufficient documentation

## 2013-09-20 NOTE — Assessment & Plan Note (Signed)
Still having regular periods, but is having vaginal bleeding/spotting and discharge most other days of the month.  Discussed further w/up including pelvic ultrasound and gyn referral.  She was agreeable to gyn referral.

## 2013-09-20 NOTE — Assessment & Plan Note (Signed)
Blood pressure under good control.  Same medication regimen.  Follow.

## 2013-09-20 NOTE — Assessment & Plan Note (Signed)
Has adjusted her diet.  Exercising.  Just started.  Have discussed [facility] referral.   Follow.

## 2013-09-20 NOTE — Assessment & Plan Note (Signed)
Followed by nephrology. 

## 2013-09-20 NOTE — Assessment & Plan Note (Signed)
Has seen nephrology.  Presumably felt to be due to thin basement membrane disease.  Negative renal biopsy.  Renal function stable.

## 2013-09-20 NOTE — Assessment & Plan Note (Signed)
Low cholesterol diet and exercise.  Follow.  Not on any medication.

## 2013-09-22 ENCOUNTER — Encounter: Payer: Self-pay | Admitting: Internal Medicine

## 2013-09-25 NOTE — Telephone Encounter (Signed)
Mailed unread message to pt  

## 2013-09-29 ENCOUNTER — Encounter: Payer: Self-pay | Admitting: Emergency Medicine

## 2013-09-30 ENCOUNTER — Encounter: Payer: Self-pay | Admitting: Internal Medicine

## 2014-01-18 ENCOUNTER — Encounter: Payer: Self-pay | Admitting: Internal Medicine

## 2014-01-18 ENCOUNTER — Ambulatory Visit (INDEPENDENT_AMBULATORY_CARE_PROVIDER_SITE_OTHER): Payer: 59 | Admitting: Internal Medicine

## 2014-01-18 VITALS — BP 106/80 | HR 73 | Temp 98.1°F | Ht 62.5 in | Wt 234.0 lb

## 2014-01-18 DIAGNOSIS — E7800 Pure hypercholesterolemia, unspecified: Secondary | ICD-10-CM

## 2014-01-18 DIAGNOSIS — I10 Essential (primary) hypertension: Secondary | ICD-10-CM

## 2014-01-18 DIAGNOSIS — R319 Hematuria, unspecified: Secondary | ICD-10-CM

## 2014-01-18 DIAGNOSIS — R0602 Shortness of breath: Secondary | ICD-10-CM

## 2014-01-18 DIAGNOSIS — R7309 Other abnormal glucose: Secondary | ICD-10-CM

## 2014-01-18 DIAGNOSIS — R002 Palpitations: Secondary | ICD-10-CM

## 2014-01-18 DIAGNOSIS — R739 Hyperglycemia, unspecified: Secondary | ICD-10-CM

## 2014-01-18 DIAGNOSIS — E669 Obesity, unspecified: Secondary | ICD-10-CM

## 2014-01-18 DIAGNOSIS — N926 Irregular menstruation, unspecified: Secondary | ICD-10-CM

## 2014-01-18 DIAGNOSIS — Q619 Cystic kidney disease, unspecified: Secondary | ICD-10-CM

## 2014-01-18 DIAGNOSIS — N281 Cyst of kidney, acquired: Secondary | ICD-10-CM

## 2014-01-18 NOTE — Progress Notes (Signed)
Pre visit review using our clinic review tool, if applicable. No additional management support is needed unless otherwise documented below in the visit note. 

## 2014-01-24 ENCOUNTER — Other Ambulatory Visit: Payer: Self-pay | Admitting: Internal Medicine

## 2014-01-24 ENCOUNTER — Encounter: Payer: Self-pay | Admitting: Internal Medicine

## 2014-01-24 DIAGNOSIS — R9431 Abnormal electrocardiogram [ECG] [EKG]: Secondary | ICD-10-CM

## 2014-01-24 DIAGNOSIS — R0602 Shortness of breath: Secondary | ICD-10-CM | POA: Insufficient documentation

## 2014-01-24 DIAGNOSIS — Z6841 Body Mass Index (BMI) 40.0 and over, adult: Secondary | ICD-10-CM | POA: Insufficient documentation

## 2014-01-24 NOTE — Progress Notes (Signed)
  Subjective:    [name], female    DOB: 1971/04/12, 43 y.o.   MRN: [id number]  HPI 43 year old female with past history of hypertension who comes in today for a scheduled follow up.  She states she has been doing relatively well.  Not watching what she eats.  Not exercising.  Blood pressure has been doing well.   Bowels stable.  She did see gyn.  States biopsy negative.  Genetic testing ok.  She does report some increased back pain and pain that extends up around her upper abdomen.  Also reports some increased sob and dyspnea on exertion.  No chest pain.  Stress is some better.  Daughter has been through drug rehab and appears to be doing well.  Living at home with her now.      Past Medical History  Diagnosis Date  . Hypertension   . Hypercholesterolemia   . Abnormal pap     required freezing  . Microscopic hematuria     s/p renal biopsy  . Thin basement membrane disease     Outpatient Encounter Prescriptions as of 01/18/2014  Medication Sig  . lisinopril-hydrochlorothiazide (PRINZIDE,ZESTORETIC) 20-12.5 MG per tablet Take 1 tablet by mouth  daily  . [DISCONTINUED] clotrimazole-betamethasone (LOTRISONE) cream Apply topically 2 (two) times daily.     Review of Systems Patient denies any headache, lightheadedness or dizziness.  No sinus or allergy symptoms.  No chest pain, tightness or palpitations.  No cough or congestion. Does report some increased sob and dyspnea on exertion.   No nausea or vomiting.  No acid reflux.  Some intermittent back and upper abdominal pain as outlined.  No bowel change, such as diarrhea, constipation, BRBPR or melana.  No urine change.  Discussed diet, exercise and weight loss.           Objective:   Physical Exam  Filed Vitals:   01/18/14 1600  BP: 106/80  Pulse: 73  Temp: 98.1 F (36.7 C)   Blood pressure recheck:  77/29  43 year old female in no acute distress.   HEENT:  Nares- clear.  Oropharynx - without lesions. NECK:   Supple.  Nontender.  No audible bruit.  HEART:  Appears to be regular. LUNGS:  No crackles or wheezing audible.  Respirations even and unlabored.  RADIAL PULSE:  Equal bilaterally.   ABDOMEN:  Soft, nontender.  Bowel sounds present and normal.  No audible abdominal bruit.  EXTREMITIES:  No increased edema present.  DP pulses palpable and equal bilaterally.            Assessment & Plan:  MSK.  Saw rheumatology previously.  Stable.     HEALTH MAINTENANCE.  Physical 09/17/13.  Pap 09/17/13 negative with negative HPV.   Colonoscopy 10/31/07 was normal except for extrinsic compression from the cecum.  This was performed by Dr Collene Mares ([place]).  CT revealed no acute findings - no cecal mass.  Mammogram 01/15/13 - BiRADS II.  Schedule f/u mammogram.

## 2014-01-24 NOTE — Assessment & Plan Note (Signed)
Blood pressure under good control.  Same medication regimen.  Follow.

## 2014-01-24 NOTE — Assessment & Plan Note (Signed)
Symptoms as outlined.  EKG obtained and revealed SR with flattening T waves in III and low voltage.  Given symptoms and EKG changes, will schedule stress echo with doppler to evaluate valve status, LV function, pulmonary artery pressure and to evaluate for ischemia.  Also discussed with her regarding the possibility of sleep apnea.  Wanted to schedule a sleep study.  She declines at this time.  Instructed to avoid sleeping supine and avoid sedating medications, alcohol, etc.

## 2014-01-24 NOTE — Assessment & Plan Note (Signed)
Low cholesterol diet and exercise.  Follow.  Not on any medication.

## 2014-01-24 NOTE — Assessment & Plan Note (Signed)
Has seen nephrology.  Presumably felt to be due to thin basement membrane disease.  Negative renal biopsy.  Renal function stable.

## 2014-01-24 NOTE — Assessment & Plan Note (Signed)
Saw gyn.  Biopsy negative.  Obtain records.

## 2014-01-24 NOTE — Progress Notes (Signed)
Order placed for stress echo with doppler.

## 2014-01-24 NOTE — Assessment & Plan Note (Signed)
Followed by nephrology. 

## 2014-01-24 NOTE — Assessment & Plan Note (Signed)
Discussed diet, exercise and weight loss

## 2014-01-24 NOTE — Assessment & Plan Note (Signed)
Plans to start exercising.  She wants to work on her own regarding weight loss and diet adjustment.

## 2014-01-25 ENCOUNTER — Telehealth: Payer: Self-pay

## 2014-01-25 DIAGNOSIS — R0602 Shortness of breath: Secondary | ICD-10-CM

## 2014-01-25 NOTE — Telephone Encounter (Signed)
The patient has an order for an Echo stress test wo contrast.  I called [facility] and they are asking for the order to be changed to an Echo Complete without contrast.  They stated this would still give the test results desired.   Thanks!

## 2014-01-25 NOTE — Telephone Encounter (Signed)
Echo reordered.  This is the closest thing I could get.  Let me know if I need to do something else.

## 2014-02-02 ENCOUNTER — Telehealth: Payer: Self-pay

## 2014-02-02 ENCOUNTER — Other Ambulatory Visit (HOSPITAL_COMMUNITY): Payer: Self-pay | Admitting: *Deleted

## 2014-02-02 DIAGNOSIS — R0602 Shortness of breath: Secondary | ICD-10-CM

## 2014-02-02 NOTE — Telephone Encounter (Signed)
l mom to inform pt of stress echo date.

## 2014-02-10 ENCOUNTER — Other Ambulatory Visit: Payer: Self-pay | Admitting: Internal Medicine

## 2014-02-10 LAB — CBC AND DIFFERENTIAL
HCT: 39 % (ref 36–46)
Hemoglobin: 13.3 g/dL (ref 12.0–16.0)
Neutrophils Absolute: 6 /uL
Platelets: 305 10*3/uL (ref 150–399)
WBC: 9.5 10*3/mL

## 2014-02-10 LAB — LIPID PANEL
Cholesterol: 197 mg/dL (ref 0–200)
HDL: 52 mg/dL (ref 35–70)
LDL Cholesterol: 115 mg/dL
LDl/HDL Ratio: 2.2
Triglycerides: 149 mg/dL (ref 40–160)

## 2014-02-10 LAB — HEPATIC FUNCTION PANEL
ALT: 15 U/L (ref 7–35)
AST: 15 U/L (ref 13–35)
Alkaline Phosphatase: 75 U/L (ref 25–125)
BILIRUBIN, TOTAL: 0.3 mg/dL
Bilirubin, Direct: 0.06 mg/dL (ref 0.01–0.4)

## 2014-02-10 LAB — BASIC METABOLIC PANEL
BUN: 12 mg/dL (ref 4–21)
Creatinine: 0.8 mg/dL (ref 0.5–1.1)
GLUCOSE: 124 mg/dL
POTASSIUM: 4.4 mmol/L (ref 3.4–5.3)
Sodium: 142 mmol/L (ref 137–147)

## 2014-02-10 LAB — TSH: TSH: 3.01 u[IU]/mL (ref 0.41–5.90)

## 2014-02-12 ENCOUNTER — Encounter: Payer: Self-pay | Admitting: Internal Medicine

## 2014-02-12 ENCOUNTER — Other Ambulatory Visit (INDEPENDENT_AMBULATORY_CARE_PROVIDER_SITE_OTHER): Payer: 59

## 2014-02-12 ENCOUNTER — Other Ambulatory Visit: Payer: Self-pay

## 2014-02-12 DIAGNOSIS — R0602 Shortness of breath: Secondary | ICD-10-CM

## 2014-02-14 ENCOUNTER — Encounter: Payer: Self-pay | Admitting: Internal Medicine

## 2014-02-14 DIAGNOSIS — N926 Irregular menstruation, unspecified: Secondary | ICD-10-CM

## 2014-02-15 ENCOUNTER — Telehealth: Payer: Self-pay | Admitting: *Deleted

## 2014-02-15 NOTE — Telephone Encounter (Signed)
Pt notified of echo results via my chart.

## 2014-02-15 NOTE — Telephone Encounter (Signed)
Pt called requesting results from her Echocardiogram from 7.17.15.  Please advise

## 2014-02-17 ENCOUNTER — Telehealth: Payer: Self-pay | Admitting: *Deleted

## 2014-02-17 NOTE — Telephone Encounter (Signed)
Pt called states based on Echo results is there still the need for the Stress test on 7.24.15.   Please advise pt.

## 2014-02-17 NOTE — Telephone Encounter (Signed)
Yes.  The echo just looks at the structure of the heart.  The stress echo is checking to see if all parts of the heart are getting enough blood.

## 2014-02-17 NOTE — Telephone Encounter (Signed)
Left detailed message on pts VM of MDs response

## 2014-02-18 ENCOUNTER — Telehealth: Payer: Self-pay | Admitting: *Deleted

## 2014-02-18 NOTE — Telephone Encounter (Signed)
Pt left message asking if she needs to keep her appt tomorrow for the stress test.  Left vm notifying pt that she will need to keep the appt, as per Dr Bary Leriche telephone note yesterday.

## 2014-02-19 ENCOUNTER — Other Ambulatory Visit (INDEPENDENT_AMBULATORY_CARE_PROVIDER_SITE_OTHER): Payer: 59

## 2014-02-19 ENCOUNTER — Telehealth: Payer: Self-pay | Admitting: *Deleted

## 2014-02-19 DIAGNOSIS — R0602 Shortness of breath: Secondary | ICD-10-CM

## 2014-02-19 NOTE — Telephone Encounter (Signed)
LVM to review stress echo instructions

## 2014-02-20 ENCOUNTER — Encounter: Payer: Self-pay | Admitting: Internal Medicine

## 2014-03-29 ENCOUNTER — Ambulatory Visit: Payer: 59 | Admitting: Internal Medicine

## 2014-03-30 ENCOUNTER — Telehealth: Payer: Self-pay | Admitting: Internal Medicine

## 2014-03-30 NOTE — Telephone Encounter (Signed)
Lab slip mailed to patient.

## 2014-03-30 NOTE — Telephone Encounter (Signed)
Pt notified of lab results via my chart.  Notified needed a1c drawn.  Lab order in your box.  Please mail to her.   Thanks.

## 2014-04-01 NOTE — Telephone Encounter (Signed)
Mailed unread my chart message to patient

## 2014-04-07 ENCOUNTER — Encounter: Payer: Self-pay | Admitting: Internal Medicine

## 2014-04-07 ENCOUNTER — Telehealth: Payer: Self-pay | Admitting: Internal Medicine

## 2014-04-07 ENCOUNTER — Ambulatory Visit: Payer: Self-pay | Admitting: Endocrinology

## 2014-04-07 NOTE — Telephone Encounter (Signed)
Patient Information:  Caller Name: Emiya  Phone: [phone]  Patient: Kathleen Espinoza  Gender: Female  DOB: Dec 17, 1970  Age: 43 Years  PCP: Einar Pheasant  Pregnant: No  Office Follow Up:  Does the office need to follow up with this patient?: No  Instructions For The Office: N/A   Symptoms  Reason For Call & Symptoms: Not feeling well at work yesterday 9/8, had headache and noting puffy swelling around eyes, found BP 160/no clear reading for dystolic. Today no headache but still not feeling well.  BP just taken at 180/120.  Reviewed Health History In EMR: Yes  Reviewed Medications In EMR: Yes  Reviewed Allergies In EMR: Yes  Reviewed Surgeries / Procedures: Yes  Date of Onset of Symptoms: 04/06/2014 OB / GYN:  LMP: 03/24/2014  Guideline(s) Used:  High Blood Pressure  Disposition Per Guideline:   See Today in Office  Reason For Disposition Reached:   BP > 180/110  Advice Given:  N/A  Patient Will Follow Care Advice:  YES  Appointment Scheduled:  04/07/2014 14:15:00 Appointment Scheduled Provider:  Bynum Bellows

## 2014-04-07 NOTE — Telephone Encounter (Signed)
Pt notified that Dr. Howell Rucks is an endocrinologist. Pt was notified to go to [facility] walk in today for evaluation & treatment & follow- up with Dr. Nicki Reaper when needed.

## 2014-04-07 NOTE — Telephone Encounter (Signed)
Dr. Howell Rucks is not a primary care physician, she is an endocrinologist. Please make a note of that for the future & please call patient & notify her that she needs to be seen by a different doctor. If no available slot at [facility], she needs to be seen at [facility], urgent care, or ER. Dr. Dr. Nicki Reaper is not in the office this afternoon.

## 2014-04-10 ENCOUNTER — Other Ambulatory Visit: Payer: Self-pay | Admitting: Internal Medicine

## 2014-10-07 ENCOUNTER — Other Ambulatory Visit: Payer: Self-pay | Admitting: Internal Medicine

## 2014-10-15 ENCOUNTER — Encounter: Payer: Self-pay | Admitting: Internal Medicine

## 2014-10-25 ENCOUNTER — Telehealth: Payer: Self-pay

## 2014-10-25 NOTE — Telephone Encounter (Signed)
Pt notified. appt scheduled.

## 2014-10-25 NOTE — Telephone Encounter (Signed)
Please review your provider's message.

## 2014-10-25 NOTE — Telephone Encounter (Signed)
Patient called stating she is experiencing Left forearm pain, it is sore to stretch. Patient stated that this has been going on for 3-4wks. No known injury. Patient also states that it hurts most when lifting things and it radiates down to her thumb. It does not hurt when resting arm. Please advise.

## 2014-10-25 NOTE — Telephone Encounter (Signed)
If persistent pain, needs evaluation.  I can see her tomorrow at 11:45 - work in for this.  Will need to check with Carolee Rota before calling pt, because I gave her some names of people to work in tomorrow (I think).  Need to make sure 11:45 slot is open.  Thanks.

## 2014-10-26 ENCOUNTER — Ambulatory Visit (INDEPENDENT_AMBULATORY_CARE_PROVIDER_SITE_OTHER): Payer: 59 | Admitting: Internal Medicine

## 2014-10-26 ENCOUNTER — Encounter: Payer: Self-pay | Admitting: Internal Medicine

## 2014-10-26 VITALS — BP 114/76 | HR 65 | Temp 97.8°F | Ht 62.5 in | Wt 234.2 lb

## 2014-10-26 DIAGNOSIS — M79632 Pain in left forearm: Secondary | ICD-10-CM

## 2014-10-26 DIAGNOSIS — I10 Essential (primary) hypertension: Secondary | ICD-10-CM

## 2014-10-26 NOTE — Progress Notes (Signed)
Pre visit review using our clinic review tool, if applicable. No additional management support is needed unless otherwise documented below in the visit note. 

## 2014-10-30 ENCOUNTER — Encounter: Payer: Self-pay | Admitting: Internal Medicine

## 2014-10-30 DIAGNOSIS — M79632 Pain in left forearm: Secondary | ICD-10-CM | POA: Insufficient documentation

## 2014-10-30 NOTE — Assessment & Plan Note (Signed)
Blood pressure doing well.  Same medication regimen.  Follow pressures.   

## 2014-10-30 NOTE — Assessment & Plan Note (Signed)
Pain and exam as outlined.  Question if muscle strain or tendonitis.  Tylenol as directed.  Avoid strenuous lifting.  Call with update in 1-2 weeks.  Follow.

## 2014-10-30 NOTE — Progress Notes (Signed)
[name], female   DOB: Oct 16, 1970, 44 y.o.   MRN: [id number]   Subjective:    [name], female    DOB: Apr 12, 1971, 44 y.o.   MRN: [id number]  HPI  Patient here as a work in with concerns regarding left arm pain.  States symptoms started one month ago.  Extends from the wrist to just below the elbow.  Hurts to squeeze.  No injury.  No new activity.  No rash.  No swelling.  No other joint pains.    Past Medical History  Diagnosis Date  . Hypertension   . Hypercholesterolemia   . Abnormal pap     required freezing  . Microscopic hematuria     s/p renal biopsy  . Thin basement membrane disease     Current Outpatient Prescriptions on File Prior to Visit  Medication Sig Dispense Refill  . lisinopril-hydrochlorothiazide (PRINZIDE,ZESTORETIC) 20-12.5 MG per tablet Take 1 tablet by mouth  daily 90 tablet 0   No current facility-administered medications on file prior to visit.    Review of Systems  Respiratory: Negative for cough, chest tightness and shortness of breath.   Cardiovascular: Negative for chest pain and leg swelling.  Gastrointestinal: Negative for nausea and vomiting.  Musculoskeletal:       Left arm pain as outlined.  No injury.  Persistent.  Hurts to squeeze.  Has not taken anything for the pain.    Skin: Negative for color change and rash.       Objective:    Physical Exam  Neck: Neck supple.  Cardiovascular: Normal rate and regular rhythm.   Pulmonary/Chest: Breath sounds normal. No respiratory distress. She has no wheezes.  Abdominal: Soft. Bowel sounds are normal.  Musculoskeletal:  No increased pain with rotation of her forearm.  Increased tenderness to palpation over the forearm.  No rash.  No increased erythema.    Lymphadenopathy:    She has no cervical adenopathy.    BP 114/76 mmHg  Pulse 65  Temp(Src) 97.8 F (36.6 C) (Oral)  Ht 5' 2.5" (1.588 m)  Wt 234 lb 4 oz (106.255 kg)  BMI 42.14 kg/m2  SpO2 97% Wt  Readings from Last 3 Encounters:  10/26/14 234 lb 4 oz (106.255 kg)  01/18/14 234 lb (106.142 kg)  09/17/13 224 lb 8 oz (101.833 kg)     Lab Results  Component Value Date   WBC 9.5 02/10/2014   HGB 13.3 02/10/2014   HCT 39 02/10/2014   PLT 305 02/10/2014   GLUCOSE 117* 06/10/2012   CHOL 197 02/10/2014   TRIG 149 02/10/2014   HDL 52 02/10/2014   LDLCALC 115 02/10/2014   ALT 15 02/10/2014   AST 15 02/10/2014   NA 142 02/10/2014   K 4.4 02/10/2014   CL 101 06/10/2012   CREATININE 0.8 02/10/2014   BUN 12 02/10/2014   CO2 26 06/10/2012   TSH 3.01 02/10/2014   HGBA1C 5.6 05/20/2012       Assessment & Plan:   Problem List Items Addressed This Visit    Hypertension    Blood pressure doing well.  Same medication regimen.  Follow pressures.        Left forearm pain - Primary    Pain and exam as outlined.  Question if muscle strain or tendonitis.  Tylenol as directed.  Avoid strenuous lifting.  Call with update in 1-2 weeks.  Follow.           Yassin Scales,  MD   

## 2014-11-10 ENCOUNTER — Encounter: Payer: Self-pay | Admitting: Internal Medicine

## 2014-11-10 DIAGNOSIS — M79602 Pain in left arm: Secondary | ICD-10-CM

## 2014-11-12 NOTE — Telephone Encounter (Signed)
Order placed for ortho referral.   

## 2014-11-16 ENCOUNTER — Encounter: Payer: Self-pay | Admitting: Internal Medicine

## 2014-11-16 LAB — LIPID PANEL
CHOLESTEROL: 202 mg/dL — AB (ref 0–200)
HDL: 53 mg/dL (ref 35–70)
LDL CALC: 124 mg/dL
TRIGLYCERIDES: 125 mg/dL (ref 40–160)

## 2014-11-16 LAB — HEPATIC FUNCTION PANEL
ALK PHOS: 87 U/L (ref 25–125)
ALT: 8 U/L (ref 7–35)
AST: 8 U/L — AB (ref 13–35)
Bilirubin, Direct: 0.11 mg/dL (ref 0.01–0.4)
Bilirubin, Total: 0.5 mg/dL

## 2014-11-16 LAB — CBC AND DIFFERENTIAL
HEMATOCRIT: 39 % (ref 36–46)
Hemoglobin: 13.2 g/dL (ref 12.0–16.0)
Neutrophils Absolute: 8 /uL
Platelets: 306 10*3/uL (ref 150–399)
WBC: 12 10^3/mL

## 2014-11-16 LAB — BASIC METABOLIC PANEL
BUN: 11 mg/dL (ref 4–21)
Creatinine: 0.8 mg/dL (ref 0.5–1.1)
Glucose: 139 mg/dL
Potassium: 4.3 mmol/L (ref 3.4–5.3)
Sodium: 138 mmol/L (ref 137–147)

## 2014-11-16 LAB — TSH: TSH: 3.16 u[IU]/mL (ref 0.41–5.90)

## 2014-11-18 ENCOUNTER — Encounter: Payer: Self-pay | Admitting: Internal Medicine

## 2014-11-18 NOTE — Telephone Encounter (Signed)
Pt notified of lab results via my chart.   Need f/u cbc, glucose and a1c.

## 2014-11-19 NOTE — Telephone Encounter (Signed)
Mailed to pt

## 2014-11-29 ENCOUNTER — Encounter: Payer: Self-pay | Admitting: Internal Medicine

## 2014-12-15 ENCOUNTER — Encounter: Payer: Self-pay | Admitting: Internal Medicine

## 2014-12-15 LAB — BASIC METABOLIC PANEL: Glucose: 124 mg/dL

## 2014-12-15 LAB — CBC AND DIFFERENTIAL
HCT: 39 % (ref 36–46)
Hemoglobin: 13.6 g/dL (ref 12.0–16.0)
Neutrophils Absolute: 5 /uL
Platelets: 287 10*3/uL (ref 150–399)
WBC: 8.6 10^3/mL

## 2014-12-15 LAB — HEMOGLOBIN A1C: Hgb A1c MFr Bld: 6.1 % — AB (ref 4.0–6.0)

## 2014-12-21 ENCOUNTER — Encounter: Payer: Self-pay | Admitting: Internal Medicine

## 2014-12-22 ENCOUNTER — Emergency Department
Admission: EM | Admit: 2014-12-22 | Discharge: 2014-12-22 | Disposition: A | Discharge status: Home / Self Care | Payer: 59 | Attending: Emergency Medicine | Admitting: Emergency Medicine

## 2014-12-22 ENCOUNTER — Emergency Department: Payer: 59

## 2014-12-22 ENCOUNTER — Encounter: Payer: Self-pay | Admitting: *Deleted

## 2014-12-22 DIAGNOSIS — M545 Low back pain, unspecified: Secondary | ICD-10-CM

## 2014-12-22 DIAGNOSIS — Z79899 Other long term (current) drug therapy: Secondary | ICD-10-CM | POA: Insufficient documentation

## 2014-12-22 DIAGNOSIS — I10 Essential (primary) hypertension: Secondary | ICD-10-CM | POA: Diagnosis not present

## 2014-12-22 MED ORDER — DIAZEPAM 2 MG PO TABS: 2.0000 mg | ORAL_TABLET | Freq: Three times a day (TID) | ORAL | Status: DC | PRN

## 2014-12-22 MED ORDER — KETOROLAC TROMETHAMINE 60 MG/2ML IM SOLN
INTRAMUSCULAR | Status: AC
  Administered 2014-12-22: 60 mg via INTRAMUSCULAR
  Filled 2014-12-22: qty 2

## 2014-12-22 MED ORDER — DIAZEPAM 2 MG PO TABS
ORAL_TABLET | ORAL | Status: AC
  Administered 2014-12-22: 2 mg via ORAL
  Filled 2014-12-22: qty 1

## 2014-12-22 MED ORDER — DIAZEPAM 2 MG PO TABS
2.0000 mg | ORAL_TABLET | Freq: Once | ORAL | Status: AC
Start: 2014-12-22 — End: 2014-12-22
  Administered 2014-12-22: 2 mg via ORAL

## 2014-12-22 MED ORDER — ETODOLAC 400 MG PO TABS: 400.0000 mg | ORAL_TABLET | Freq: Two times a day (BID) | ORAL | Status: DC

## 2014-12-22 MED ORDER — HYDROCODONE-ACETAMINOPHEN 5-325 MG PO TABS: 1.0000 | ORAL_TABLET | ORAL | Status: DC | PRN

## 2014-12-22 MED ORDER — HYDROCODONE-ACETAMINOPHEN 5-325 MG PO TABS
ORAL_TABLET | ORAL | Status: AC
  Administered 2014-12-22: 1 via ORAL
  Filled 2014-12-22: qty 1

## 2014-12-22 MED ORDER — HYDROCODONE-ACETAMINOPHEN 5-325 MG PO TABS
1.0000 | ORAL_TABLET | Freq: Once | ORAL | Status: AC
Start: 2014-12-22 — End: 2014-12-22
  Administered 2014-12-22: 1 via ORAL

## 2014-12-22 MED ORDER — KETOROLAC TROMETHAMINE 60 MG/2ML IM SOLN
60.0000 mg | Freq: Once | INTRAMUSCULAR | Status: AC
  Administered 2014-12-22: 60 mg via INTRAMUSCULAR

## 2014-12-22 NOTE — ED Notes (Addendum)
PT STATES lower back pain since this AM, pt has been playing several softball games this week, pt states she has had back pain before, 7/10 pain, pt in no distress upon assessment, pt saw chiropractor this AM but did not get relief

## 2014-12-22 NOTE — ED Provider Notes (Signed)
[facility] Emergency Department Provider Note  ____________________________________________  Time seen:  12:27 PM  I have reviewed the triage vital signs and the nursing notes.   HISTORY  Chief Complaint Back Pain   HPI Kathleen Espinoza is a 44 y.o. female comes in today with complaint of low back pain. She states that this started this morning without any recent injury. Currently her pain is 7 out of 10. She denies any bowel or bladder incontinence. She saw a chiropractor today but did not get any relief. Last evening she took an ibuprofen 600 mg. Currently she is using a TENS unit. She states that she has played several softball games this week but denies any injury.   Past Medical History  Diagnosis Date  . Hypertension   . Hypercholesterolemia   . Abnormal pap     required freezing  . Microscopic hematuria     s/p renal biopsy  . Thin basement membrane disease     Patient Active Problem List   Diagnosis Date Noted  . Left forearm pain 10/30/2014  . SOB (shortness of breath) 01/24/2014  . Obesity 01/24/2014  . Irregular menstrual cycle 09/20/2013  . Hyperglycemia 04/27/2013  . Hematuria 06/02/2012  . Hypercholesterolemia 06/02/2012  . Renal cyst 06/02/2012  . Hypertension 05/26/2012    Past Surgical History  Procedure Laterality Date  . Tonsillectomy    . Tubal ligation      Current Outpatient Rx  Name  Route  Sig  Dispense  Refill  . diazepam (VALIUM) 2 MG tablet   Oral   Take 1 tablet (2 mg total) by mouth every 8 (eight) hours as needed for muscle spasms.   9 tablet   0   . etodolac (LODINE) 400 MG tablet   Oral   Take 1 tablet (400 mg total) by mouth 2 (two) times daily.   20 tablet   0   . HYDROcodone-acetaminophen (NORCO/VICODIN) 5-325 MG per tablet   Oral   Take 1 tablet by mouth every 4 (four) hours as needed for moderate pain.   20 tablet   0   . lisinopril-hydrochlorothiazide (PRINZIDE,ZESTORETIC) 20-12.5 MG per  tablet      Take 1 tablet by mouth  daily   90 tablet   0     Allergies Betadine; Iodine; and Norvasc  Family History  Problem Relation Age of Onset  . Colon cancer Mother     died - age 53  . Hypertension Father   . Colon cancer Maternal Grandfather   . Breast cancer Maternal Aunt     Social History History  Substance Use Topics  . Smoking status: Never Smoker   . Smokeless tobacco: Never Used  . Alcohol Use: No    Review of Systems Constitutional: No fever/chills Cardiovascular: Denies chest pain. Respiratory: Denies shortness of breath. Gastrointestinal: No abdominal pain.  No nausea, no vomiting.  No diarrhea.  No constipation. Genitourinary: Negative for dysuria. Musculoskeletal: Positive for back pain. Skin: Negative for rash. Neurological: Negative for headaches, focal weakness or numbness.  10-point ROS otherwise negative.  ____________________________________________   PHYSICAL EXAM:  VITAL SIGNS: ED Triage Vitals  Enc Vitals Group     BP 12/22/14 1227 126/59 mmHg     Pulse Rate 12/22/14 1227 77     Resp 12/22/14 1227 18     Temp 12/22/14 1227 98 F (36.7 C)     Temp Source 12/22/14 1227 Oral     SpO2 12/22/14 1227 98 %  Weight 12/22/14 1227 231 lb (104.781 kg)     Height 12/22/14 1227 5\' 2"  (1.575 m)     Head Cir --      Peak Flow --      Pain Score --      Pain Loc --      Pain Edu? --      Excl. in [place]? --     Constitutional: Alert and oriented. Well appearing and in no acute distress. Eyes: Conjunctivae are normal. PERRL. EOMI. Head: Atraumatic. Nose: No congestion/rhinnorhea. Neck: No stridor.  Supple Cardiovascular: Normal rate, regular rhythm. Grossly normal heart sounds.  Good peripheral circulation. Respiratory: Normal respiratory effort.  No retractions. Lungs CTAB. Gastrointestinal: Soft and nontender. No distention. No abdominal bruits. No CVA tenderness. Musculoskeletal: No lower extremity tenderness nor edema.  No joint  effusions. Back exam no gross deformity. Range of motion is restricted secondary to pain and muscle spasms. Patient's gait is very guarded as well as any movement. Moderate tenderness on palpation of paravertebral muscle L5-S1 area. Straight leg raises are approximate 70 bilaterally with some discomfort in her lower back. Neurologic:  Normal speech and language. No gross focal neurologic deficits are appreciated. Speech is normal. Reflexes are equal at 2+ bilaterally Skin:  Skin is warm, dry and intact. No rash noted. Psychiatric: Mood and affect are normal. Speech and behavior are normal.  ____________________________________________   LABS (all labs ordered are listed, but only abnormal results are displayed)  Labs Reviewed - No data to display RADIOLOGY  X-rays of lumbar spine showed no acute findings, very minimal degenerative changes per radiologist ____________________________________________   PROCEDURES  Procedure(s) performed: None  Critical Care performed: No  ____________________________________________   INITIAL IMPRESSION / ASSESSMENT AND PLAN / ED COURSE  Pertinent labs & imaging results that were available during my care of the patient were reviewed by me and considered in my medical decision making (see chart for details).  Patient was given Toradol IM along with some diazepam prior to x-ray. She agrees there is some improvement prior to discharge. She was given prescriptions and to follow-up with orthopedist on-call if no improvement. ____________________________________________   FINAL CLINICAL IMPRESSION(S) / ED DIAGNOSES  Final diagnoses:  Acute low back pain      Johnn Hai, PA-C 12/22/14 1540

## 2014-12-27 ENCOUNTER — Telehealth: Payer: Self-pay | Admitting: Internal Medicine

## 2014-12-27 NOTE — Telephone Encounter (Signed)
Pt notified of lab results via mychart. 

## 2015-01-05 ENCOUNTER — Other Ambulatory Visit: Payer: Self-pay | Admitting: Internal Medicine

## 2015-01-11 ENCOUNTER — Encounter: Payer: Self-pay | Admitting: Internal Medicine

## 2015-03-09 ENCOUNTER — Encounter: Payer: Self-pay | Admitting: Internal Medicine

## 2015-04-07 ENCOUNTER — Encounter: Payer: Self-pay | Admitting: Internal Medicine

## 2015-04-07 ENCOUNTER — Ambulatory Visit (INDEPENDENT_AMBULATORY_CARE_PROVIDER_SITE_OTHER): Payer: 59 | Admitting: Internal Medicine

## 2015-04-07 VITALS — BP 110/70 | HR 64 | Temp 98.5°F | Ht 62.5 in | Wt 226.4 lb

## 2015-04-07 DIAGNOSIS — Z1239 Encounter for other screening for malignant neoplasm of breast: Secondary | ICD-10-CM

## 2015-04-07 DIAGNOSIS — Q6100 Congenital renal cyst, unspecified: Secondary | ICD-10-CM | POA: Diagnosis not present

## 2015-04-07 DIAGNOSIS — N281 Cyst of kidney, acquired: Secondary | ICD-10-CM

## 2015-04-07 DIAGNOSIS — E7800 Pure hypercholesterolemia, unspecified: Secondary | ICD-10-CM

## 2015-04-07 DIAGNOSIS — Z0000 Encounter for general adult medical examination without abnormal findings: Secondary | ICD-10-CM | POA: Diagnosis not present

## 2015-04-07 DIAGNOSIS — I10 Essential (primary) hypertension: Secondary | ICD-10-CM | POA: Diagnosis not present

## 2015-04-07 DIAGNOSIS — R0781 Pleurodynia: Secondary | ICD-10-CM

## 2015-04-07 DIAGNOSIS — L989 Disorder of the skin and subcutaneous tissue, unspecified: Secondary | ICD-10-CM

## 2015-04-07 DIAGNOSIS — E669 Obesity, unspecified: Secondary | ICD-10-CM

## 2015-04-07 DIAGNOSIS — R739 Hyperglycemia, unspecified: Secondary | ICD-10-CM

## 2015-04-07 NOTE — Progress Notes (Signed)
Pre-visit discussion using our clinic review tool. No additional management support is needed unless otherwise documented below in the visit note.  

## 2015-04-07 NOTE — Progress Notes (Signed)
[name], female   DOB: [birth year], 44 y.o.   MRN: [id number]   Subjective:    [name], female    DOB: 06/23/1971, 44 y.o.   MRN: [id number]  HPI  Patient here to follow up on her current medical issues as well as for her complete physical exam.  She is accompanied by her husband.  History obtained from both of them.  Playing softball.  No cardiac symptoms with increased activity or exertion.  No sob.  Not watching her diet.  Weight is up.  No abdominal pain or cramping.  Due to see  dermatology next month.  Has a persistent facial lesion.  Also has periodic lesions that will appear and then go away in one hour - per her report.  None today.  She also reports some discomfort under her breasts - lower anterior ribs.  Just uncomfortable when lies down.  No pain with deep breathing.  No pain during the day.     Past Medical History  Diagnosis Date  . Hypertension   . Hypercholesterolemia   . Abnormal pap     required freezing  . Microscopic hematuria     s/p renal biopsy  . Thin basement membrane disease    Past Surgical History  Procedure Laterality Date  . Tonsillectomy    . Tubal ligation     Family History  Problem Relation Age of Onset  . Colon cancer Mother     died - age 71  . Hypertension Father   . Colon cancer Maternal Grandfather   . Breast cancer Maternal Aunt    Social History   Social History  . Marital Status: Married    Spouse Name: N/A  . Number of Children: 2  . 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 Smoking status: Never Smoker   . Smokeless tobacco: Never Used  . Alcohol Use: No  . Drug Use: No  . Sexual Activity: Not Asked   Other Topics Concern  . None   Social History Narrative    Outpatient Encounter Prescriptions as of 04/07/2015  Medication Sig  . lisinopril-hydrochlorothiazide (PRINZIDE,ZESTORETIC) 20-12.5 MG per tablet Take 1 tablet by mouth   daily  . diazepam (VALIUM) 2 MG tablet Take 1 tablet (2 mg total) by mouth every 8 (eight) hours as needed for muscle spasms. (Patient not taking: Reported on 04/07/2015)  . etodolac (LODINE) 400 MG tablet Take 1 tablet (400 mg total) by mouth 2 (two) times daily. (Patient not taking: Reported on 04/07/2015)  . HYDROcodone-acetaminophen (NORCO/VICODIN) 5-325 MG per tablet Take 1 tablet by mouth every 4 (four) hours as needed for moderate pain. (Patient not taking: Reported on 04/07/2015)   No facility-administered encounter medications on file as of 04/07/2015.    Review of Systems  Constitutional: Negative for appetite change and unexpected weight change.  HENT: Negative for congestion and sinus pressure.   Eyes: Negative for pain and visual disturbance.  Respiratory: Negative for cough, chest tightness and shortness of breath.   Cardiovascular: Negative for chest pain, palpitations and leg swelling.  Gastrointestinal: Negative for nausea, vomiting, abdominal pain and diarrhea.  Genitourinary: Negative for dysuria and difficulty urinating.  Musculoskeletal: Negative for back pain and joint swelling.       Some lower leg pain when running.  Wears support.    Skin: Negative for color change and rash.  Neurological: Negative for  dizziness, light-headedness and headaches.  Hematological: Negative for adenopathy. Does not bruise/bleed easily.  Psychiatric/Behavioral: Negative for dysphoric mood and agitation.       Objective:     Blood pressure rechecked by me:  118/72  Physical Exam  Constitutional: She is oriented to person, place, and time. She appears well-developed and well-nourished. No distress.  HENT:  Nose: Nose normal.  Mouth/Throat: Oropharynx is clear and moist.  Eyes: Right eye exhibits no discharge. Left eye exhibits no discharge. No scleral icterus.  Neck: Neck supple. No thyromegaly present.  Cardiovascular: Normal rate and regular rhythm.   Pulmonary/Chest: Breath sounds  normal. No accessory muscle usage. No tachypnea. No respiratory distress. She has no decreased breath sounds. She has no wheezes. She has no rhonchi. Right breast exhibits no inverted nipple, no mass, no nipple discharge and no tenderness (no axillary adenopathy). Left breast exhibits no inverted nipple, no mass, no nipple discharge and no tenderness (no axilarry adenopathy).  Abdominal: Soft. Bowel sounds are normal. There is no tenderness.  Musculoskeletal: She exhibits no edema or tenderness.  Lymphadenopathy:    She has no cervical adenopathy.  Neurological: She is alert and oriented to person, place, and time.  Skin: Skin is warm. No rash noted. No erythema.  Psychiatric: She has a normal mood and affect. Her behavior is normal.    BP 110/70 mmHg  Pulse 64  Temp(Src) 98.5 F (36.9 C) (Oral)  Ht 5' 2.5" (1.588 m)  Wt 226 lb 6 oz (102.683 kg)  BMI 40.72 kg/m2  SpO2 96%  LMP 03/19/2015 (Approximate) Wt Readings from Last 3 Encounters:  04/07/15 226 lb 6 oz (102.683 kg)  12/22/14 231 lb (104.781 kg)  10/26/14 234 lb 4 oz (106.255 kg)     Lab Results  Component Value Date   WBC 8.6 12/15/2014   HGB 13.6 12/15/2014   HCT 39 12/15/2014   PLT 287 12/15/2014   GLUCOSE 117* 06/10/2012   CHOL 202* 11/16/2014   TRIG 125 11/16/2014   HDL 53 11/16/2014   LDLCALC 124 11/16/2014   ALT 8 11/16/2014   AST 8* 11/16/2014   NA 138 11/16/2014   K 4.3 11/16/2014   CL 101 06/10/2012   CREATININE 0.8 11/16/2014   BUN 11 11/16/2014   CO2 26 06/10/2012   TSH 3.16 11/16/2014   HGBA1C 6.1* 12/15/2014    Dg Lumbar Spine 2-3 Views  12/22/2014   CLINICAL DATA:  Low back pain since this morning, no radiation. No injury.  EXAM: LUMBAR SPINE - 2-3 VIEW  COMPARISON:  None.  FINDINGS: Vertebral body height and alignment are maintained. Very minimal anterior marginal osteophytosis in the mid and lower lumbar spine. Mild facet sclerosis in the lower lumbar spine.  IMPRESSION: 1. No acute findings. 2.  Very minimal degenerative changes in the spine.   Electronically Signed   By: Lorin Picket M.D.   On: 12/22/2014 13:27       Assessment & Plan:   Problem List Items Addressed This Visit    Facial lesion    Persistent lesion.  Has appt with dermatology next month.        Health care maintenance    Physical today 04/07/15.  Schedule mammogram - overdue.  PAP 09/17/13 - negative with negative HPV.       Hypercholesterolemia    Low cholesterol diet and exercise.  Follow  Not on any medication.        Hyperglycemia    Low carb diet and exercise.  Follow met b and a1c.        Hypertension    Blood pressure under good control.  Continue same medication regimen.  Follow pressures.  Follow metabolic panel.        Obesity    Discussed diet and exercise.  Follow.       Renal cyst    Has been followed by nephrology.       Rib pain    Some intermittent pain as outlined.  Discussed possibility of bra - irritating.  Tylenol as directed.  Follow.         Other Visit Diagnoses    Screening breast examination    -  Primary    Relevant Orders    MM DIGITAL SCREENING BILATERAL        Einar Pheasant, MD

## 2015-04-09 ENCOUNTER — Encounter: Payer: Self-pay | Admitting: Internal Medicine

## 2015-04-09 DIAGNOSIS — Z0000 Encounter for general adult medical examination without abnormal findings: Secondary | ICD-10-CM | POA: Insufficient documentation

## 2015-04-09 DIAGNOSIS — L989 Disorder of the skin and subcutaneous tissue, unspecified: Secondary | ICD-10-CM | POA: Insufficient documentation

## 2015-04-09 DIAGNOSIS — R0781 Pleurodynia: Secondary | ICD-10-CM | POA: Insufficient documentation

## 2015-04-09 NOTE — Assessment & Plan Note (Signed)
Discussed diet and exercise.  Follow.  

## 2015-04-09 NOTE — Assessment & Plan Note (Signed)
Physical today 04/07/15.  Schedule mammogram - overdue.  PAP 09/17/13 - negative with negative HPV.

## 2015-04-09 NOTE — Assessment & Plan Note (Signed)
Persistent lesion.  Has appt with dermatology next month.

## 2015-04-09 NOTE — Assessment & Plan Note (Signed)
Low cholesterol diet and exercise.  Follow  Not on any medication.

## 2015-04-09 NOTE — Assessment & Plan Note (Signed)
Some intermittent pain as outlined.  Discussed possibility of bra - irritating.  Tylenol as directed.  Follow.

## 2015-04-09 NOTE — Assessment & Plan Note (Signed)
Has been followed by nephrology.  

## 2015-04-09 NOTE — Assessment & Plan Note (Signed)
Low carb diet and exercise.  Follow met b and a1c.   

## 2015-04-09 NOTE — Assessment & Plan Note (Signed)
Blood pressure under good control.  Continue same medication regimen.  Follow pressures.  Follow metabolic panel.   

## 2015-04-15 ENCOUNTER — Ambulatory Visit: Payer: 59

## 2015-04-18 ENCOUNTER — Ambulatory Visit
Admission: RE | Admit: 2015-04-18 | Discharge: 2015-04-18 | Disposition: A | Discharge status: Home / Self Care | Payer: 59 | Source: Ambulatory Visit | Attending: Internal Medicine | Admitting: Internal Medicine

## 2015-04-18 DIAGNOSIS — Z1239 Encounter for other screening for malignant neoplasm of breast: Secondary | ICD-10-CM

## 2015-04-18 DIAGNOSIS — Z1231 Encounter for screening mammogram for malignant neoplasm of breast: Secondary | ICD-10-CM | POA: Diagnosis not present

## 2015-04-29 ENCOUNTER — Other Ambulatory Visit: Payer: Self-pay | Admitting: Internal Medicine

## 2015-04-30 LAB — LIPID PANEL WITH LDL/HDL RATIO
CHOLESTEROL TOTAL: 191 mg/dL (ref 100–199)
HDL: 49 mg/dL (ref >39)
LDL Calculated: 123 mg/dL — ABNORMAL HIGH (ref 0–99)
LDl/HDL Ratio: 2.5 ratio units (ref 0.0–3.2)
Triglycerides: 94 mg/dL (ref 0–149)
VLDL CHOLESTEROL CAL: 19 mg/dL (ref 5–40)

## 2015-04-30 LAB — HGB A1C W/O EAG: Hgb A1c MFr Bld: 6.3 % — ABNORMAL HIGH (ref 4.8–5.6)

## 2015-04-30 LAB — BASIC METABOLIC PANEL
BUN / CREAT RATIO: 13 (ref 9–23)
BUN: 10 mg/dL (ref 6–24)
CALCIUM: 9 mg/dL (ref 8.7–10.2)
CHLORIDE: 102 mmol/L (ref 97–108)
CO2: 21 mmol/L (ref 18–29)
Creatinine, Ser: 0.77 mg/dL (ref 0.57–1.00)
GFR calc Af Amer: 109 mL/min/{1.73_m2} (ref >59)
GFR calc non Af Amer: 95 mL/min/{1.73_m2} (ref >59)
GLUCOSE: 106 mg/dL — AB (ref 65–99)
Potassium: 4.1 mmol/L (ref 3.5–5.2)
Sodium: 139 mmol/L (ref 134–144)

## 2015-04-30 LAB — HEPATIC FUNCTION PANEL
ALBUMIN: 4.2 g/dL (ref 3.5–5.5)
ALT: 12 IU/L (ref 0–32)
AST: 13 IU/L (ref 0–40)
Alkaline Phosphatase: 100 IU/L (ref 39–117)
Bilirubin Total: 0.4 mg/dL (ref 0.0–1.2)
Bilirubin, Direct: 0.09 mg/dL (ref 0.00–0.40)
Total Protein: 7.1 g/dL (ref 6.0–8.5)

## 2015-04-30 LAB — MICROALBUMIN / CREATININE URINE RATIO
Creatinine, Urine: 204.2 mg/dL
MICROALB/CREAT RATIO: 6.2 mg/g creat (ref 0.0–30.0)
MICROALBUM., U, RANDOM: 12.6 ug/mL

## 2015-05-01 ENCOUNTER — Encounter: Payer: Self-pay | Admitting: Internal Medicine

## 2015-05-11 ENCOUNTER — Encounter: Payer: Self-pay | Admitting: Internal Medicine

## 2015-07-04 ENCOUNTER — Other Ambulatory Visit: Payer: Self-pay | Admitting: Internal Medicine

## 2015-08-08 ENCOUNTER — Ambulatory Visit: Payer: 59 | Admitting: Internal Medicine

## 2015-08-19 ENCOUNTER — Encounter: Payer: Self-pay | Admitting: Internal Medicine

## 2015-08-19 ENCOUNTER — Ambulatory Visit: Payer: 59 | Admitting: Internal Medicine

## 2015-08-19 ENCOUNTER — Ambulatory Visit (INDEPENDENT_AMBULATORY_CARE_PROVIDER_SITE_OTHER): Payer: 59 | Admitting: Internal Medicine

## 2015-08-19 VITALS — BP 118/80 | HR 72 | Temp 98.2°F | Resp 18 | Ht 62.5 in | Wt 227.0 lb

## 2015-08-19 DIAGNOSIS — I10 Essential (primary) hypertension: Secondary | ICD-10-CM

## 2015-08-19 DIAGNOSIS — N281 Cyst of kidney, acquired: Secondary | ICD-10-CM

## 2015-08-19 DIAGNOSIS — E7800 Pure hypercholesterolemia, unspecified: Secondary | ICD-10-CM

## 2015-08-19 DIAGNOSIS — E669 Obesity, unspecified: Secondary | ICD-10-CM

## 2015-08-19 DIAGNOSIS — M7712 Lateral epicondylitis, left elbow: Secondary | ICD-10-CM

## 2015-08-19 DIAGNOSIS — R739 Hyperglycemia, unspecified: Secondary | ICD-10-CM

## 2015-08-19 DIAGNOSIS — R0789 Other chest pain: Secondary | ICD-10-CM

## 2015-08-19 DIAGNOSIS — Q6100 Congenital renal cyst, unspecified: Secondary | ICD-10-CM

## 2015-08-19 DIAGNOSIS — R0781 Pleurodynia: Secondary | ICD-10-CM | POA: Diagnosis not present

## 2015-08-19 NOTE — Progress Notes (Signed)
Pre-visit discussion using our clinic review tool. No additional management support is needed unless otherwise documented below in the visit note.  

## 2015-08-19 NOTE — Progress Notes (Signed)
[name], female   DOB: 1970-12-11, 45 y.o.   MRN: [id number]   Subjective:    [name], female    DOB: Jun 15, 1971, 45 y.o.   MRN: [id number]  HPI  Patient with past history of hypercholesterolemia, thin basement membrane disease and hypertension.  Recently found to have elevated sugars.  She comes in today to follow up on these issues.  She states she is doing relatively well.  Not exercising.  Plans to start.  We discussed diet and exercise.  Discussed recent labs - elevated sugar and slightly increased cholesterol.  Discussed low carb diet.  No chest pain or tightness.  No sob.  No abdominal pain or cramping.  Bowels stable.  She is having some left elbow pain.  Has tennis elbow.  Wearing the strap.  Does repetitive motion at work.     Past Medical History  Diagnosis Date  . Hypertension   . Hypercholesterolemia   . Abnormal pap     required freezing  . Microscopic hematuria     s/p renal biopsy  . Thin basement membrane disease    Past Surgical History  Procedure Laterality Date  . Tonsillectomy    . Tubal ligation     Family History  Problem Relation Age of Onset  . Colon cancer Mother     died - age 50  . Hypertension Father   . Colon cancer Maternal Grandfather   . Breast cancer Maternal Aunt 60  . Breast cancer Paternal Aunt 17   Social History   Social History  . Marital Status: Married    Spouse Name: N/A  . Number of Children: 2  . 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 Smoking status: Never Smoker   . Smokeless tobacco: Never Used  . Alcohol Use: No  . Drug Use: No  . Sexual Activity: Not Asked   Other Topics Concern  . None   Social History Narrative    Outpatient Encounter Prescriptions as of 08/19/2015  Medication Sig  . lisinopril-hydrochlorothiazide (PRINZIDE,ZESTORETIC) 20-12.5 MG tablet Take 1 tablet by mouth  daily  . [DISCONTINUED] diazepam (VALIUM) 2  MG tablet Take 1 tablet (2 mg total) by mouth every 8 (eight) hours as needed for muscle spasms.  . [DISCONTINUED] etodolac (LODINE) 400 MG tablet Take 1 tablet (400 mg total) by mouth 2 (two) times daily.  . [DISCONTINUED] HYDROcodone-acetaminophen (NORCO/VICODIN) 5-325 MG per tablet Take 1 tablet by mouth every 4 (four) hours as needed for moderate pain.   No facility-administered encounter medications on file as of 08/19/2015.    Review of Systems  Constitutional: Negative for appetite change and unexpected weight change.       Discussed diet and exercise.    HENT: Negative for congestion and sinus pressure.   Eyes: Negative for discharge and redness.  Respiratory: Negative for cough, chest tightness and shortness of breath.   Cardiovascular: Negative for chest pain, palpitations and leg swelling.  Gastrointestinal: Negative for nausea, vomiting, abdominal pain and diarrhea.  Genitourinary: Negative for dysuria and difficulty urinating.  Musculoskeletal: Negative for back pain and joint swelling.       Left elbow pain as outlined.    Skin: Negative for color change and rash.  Neurological: Negative for dizziness, light-headedness and headaches.  Psychiatric/Behavioral: Negative for dysphoric mood and agitation.       Objective:    Physical Exam  Constitutional: She appears well-developed and well-nourished. No distress.  HENT:  Nose: Nose normal.  Mouth/Throat: Oropharynx is clear and moist.  Eyes: Conjunctivae are normal. Right eye exhibits no discharge. Left eye exhibits no discharge.  Neck: Neck supple. No thyromegaly present.  Cardiovascular: Normal rate and regular rhythm.   Pulmonary/Chest: Breath sounds normal. No respiratory distress. She has no wheezes.  Abdominal: Soft. Bowel sounds are normal. There is no tenderness.  Musculoskeletal: She exhibits no edema or tenderness.  Increased pain left elbow with rotation of her forearm.    Lymphadenopathy:    She has no  cervical adenopathy.  Skin: No rash noted. No erythema.  Psychiatric: She has a normal mood and affect. Her behavior is normal.    BP 118/80 mmHg  Pulse 72  Temp(Src) 98.2 F (36.8 C) (Oral)  Resp 18  Ht 5' 2.5" (1.588 m)  Wt 227 lb (102.967 kg)  BMI 40.83 kg/m2  SpO2 97% Wt Readings from Last 3 Encounters:  08/19/15 227 lb (102.967 kg)  04/07/15 226 lb 6 oz (102.683 kg)  12/22/14 231 lb (104.781 kg)     Lab Results  Component Value Date   WBC 8.6 12/15/2014   HGB 13.6 12/15/2014   HCT 39 12/15/2014   PLT 287 12/15/2014   GLUCOSE 106* 04/29/2015   CHOL 191 04/29/2015   TRIG 94 04/29/2015   HDL 49 04/29/2015   LDLCALC 123* 04/29/2015   ALT 12 04/29/2015   AST 13 04/29/2015   NA 139 04/29/2015   K 4.1 04/29/2015   CL 102 04/29/2015   CREATININE 0.77 04/29/2015   BUN 10 04/29/2015   CO2 21 04/29/2015   TSH 3.16 11/16/2014   HGBA1C 6.3* 04/29/2015    Mm Digital Screening Bilateral  04/19/2015  CLINICAL DATA:  Screening. EXAM: DIGITAL SCREENING BILATERAL MAMMOGRAM WITH CAD COMPARISON:  Previous exam(s). ACR Breast Density Category b: There are scattered areas of fibroglandular density. FINDINGS: There are no findings suspicious for malignancy. Images were processed with CAD. IMPRESSION: No mammographic evidence of malignancy. A result letter of this screening mammogram will be mailed directly to the patient. RECOMMENDATION: Screening mammogram in one year. (Code:SM-B-01Y) BI-RADS CATEGORY  1: Negative. Electronically Signed   By: Margarette Canada M.D.   On: 04/19/2015 08:44       Assessment & Plan:   Problem List Items Addressed This Visit    Hypercholesterolemia    Low cholesterol diet and exercise.  Follow lipid panel.        Hyperglycemia    Discussed low carb diet and exercise.  Follow met b and a1c.        Hypertension - Primary    Blood pressure under good control.  Continue same medication regimen.  Follow pressures.  Follow metabolic panel.        Left  tennis elbow    Elbow strap.  Unable to take increased antiinflammatories.  May need referral for possible injection.  Follow.       Obesity    Discussed diet and exercise.  Follow.       Renal cyst    Has previously been followed by nephrology.  Follow renal function.        Rib pain    Seeing chiropractor.  Better.            Einar Pheasant, MD

## 2015-08-21 ENCOUNTER — Encounter: Payer: Self-pay | Admitting: Internal Medicine

## 2015-08-21 DIAGNOSIS — M7712 Lateral epicondylitis, left elbow: Secondary | ICD-10-CM | POA: Insufficient documentation

## 2015-08-21 NOTE — Assessment & Plan Note (Signed)
Elbow strap.  Unable to take increased antiinflammatories.  May need referral for possible injection.  Follow.

## 2015-08-21 NOTE — Assessment & Plan Note (Signed)
Seeing chiropractor.  Better.

## 2015-08-21 NOTE — Assessment & Plan Note (Signed)
Discussed low carb diet and exercise.  Follow met b and a1c.   

## 2015-08-21 NOTE — Assessment & Plan Note (Signed)
Blood pressure under good control.  Continue same medication regimen.  Follow pressures.  Follow metabolic panel.   

## 2015-08-21 NOTE — Assessment & Plan Note (Signed)
Discussed diet and exercise.  Follow.  

## 2015-08-21 NOTE — Assessment & Plan Note (Signed)
Has previously been followed by nephrology.  Follow renal function.

## 2015-08-21 NOTE — Assessment & Plan Note (Signed)
Low cholesterol diet and exercise.  Follow lipid panel.   

## 2015-09-15 ENCOUNTER — Telehealth: Payer: Self-pay | Admitting: Internal Medicine

## 2015-09-15 NOTE — Telephone Encounter (Signed)
Patient Name: Kathleen Espinoza  DOB: 1970/12/21    Initial Comment Caller states, blood pressure has been elevated for 3 days ; BP now 130/92 , with a headache    Nurse Assessment  Nurse: Mallie Mussel, RN, Alveta Heimlich Date/Time Eilene Ghazi Time): 09/15/2015 2:48:03 PM  Confirm and document reason for call. If symptomatic, describe symptoms. You must click the next button to save text entered. ---Caller states that her BP was 130/92 about 45 minutes ago. It has been as high as 165/110. She has had a headache x 3 days. She states that her BP has been elevated for the past 3 days also. She rates her headache pain which she rates as 4 on 0-10 scale.  Has the patient traveled out of the country within the last 30 days? ---No  Does the patient have any new or worsening symptoms? ---Yes  Will a triage be completed? ---Yes  Related visit to physician within the last 2 weeks? ---No  Does the PT have any chronic conditions? (i.e. diabetes, asthma, etc.) ---Yes  List chronic conditions. ---HTN  Is the patient pregnant or possibly pregnant? (Ask all females between the ages of 23-55) ---No  Is this a behavioral health or substance abuse call? ---No     Guidelines    Guideline Title Affirmed Question Affirmed Notes  High Blood Pressure [1] BP < 140 /90 AND [2] taking BP medications (all triage questions negative)    Final Disposition User   See PCP When Office is Open (within 3 days) Mallie Mussel, RN, Alveta Heimlich    Comments  Caller asked to be seen tomorrow. Dr. Nicki Reaper did not have any openings for tomorrow. I was able to scheduled her for 9:15am with Dr. Thersa Salt.   Referrals  REFERRED TO PCP OFFICE  REFERRED TO PCP OFFICE   Disagree/Comply: Comply

## 2015-09-16 ENCOUNTER — Encounter: Payer: Self-pay | Admitting: Family Medicine

## 2015-09-16 ENCOUNTER — Ambulatory Visit (INDEPENDENT_AMBULATORY_CARE_PROVIDER_SITE_OTHER): Payer: 59 | Admitting: Family Medicine

## 2015-09-16 VITALS — BP 114/82 | HR 63 | Temp 97.9°F | Ht 62.5 in | Wt 227.5 lb

## 2015-09-16 DIAGNOSIS — I10 Essential (primary) hypertension: Secondary | ICD-10-CM | POA: Diagnosis not present

## 2015-09-16 NOTE — Assessment & Plan Note (Signed)
Established problem. Recent elevation, likely secondary to stress. Well controlled at this time.  Reassurance provided. Continue lisinopril/HCTZ.

## 2015-09-16 NOTE — Progress Notes (Signed)
   Subjective:  [name], female    DOB: 12-29-1970  Age: 45 y.o. MRN: [id number]  CC: Elevated BP  HPI:  45 year old female past medical history of hypertension presents with complaints that her blood pressure has been elevated.  Patient reports that on Tuesday she had a headache and felt flushed. Her blood pressure was elevated (165/110).  This persisted for several days but has now come down. She endorses compliance with her home medication of lisinopril/HCTZ. She states that she wanted to be checked out to ensure everything is okay. Also, she reports a recent stressor in her life as her daughter has recently returned home. No other known exacerbating factors. She is currently feeling well and is asymptomatic.  Social Hx   Social History   Social History  . Marital Status: Married    Spouse Name: N/A  . Number of Children: 2  . 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 Smoking status: Never Smoker   . Smokeless tobacco: Never Used  . Alcohol Use: No  . Drug Use: No  . Sexual Activity: Not Asked   Other Topics Concern  . None   Social History Narrative   Review of Systems  Constitutional: Negative.   Neurological: Positive for headaches.   Objective:  BP 114/82 mmHg  Pulse 63  Temp(Src) 97.9 F (36.6 C) (Oral)  Ht 5' 2.5" (1.588 m)  Wt 227 lb 8 oz (103.193 kg)  BMI 40.92 kg/m2  SpO2 98%  BP/Weight 09/16/2015 [id number]  Systolic BP [id number]  Diastolic BP 82 80 70  Wt. (Lbs) 227.5 227 226.38  BMI 40.92 40.83 40.72   Physical Exam  Constitutional: She is oriented to person, place, and time. No distress.  Cardiovascular: Normal rate and regular rhythm.   Pulmonary/Chest: Effort normal and breath sounds normal. No respiratory distress. She has no wheezes. She has no rales.  Neurological: She is alert and oriented to person, place, and time.  Psychiatric: She has a normal mood and  affect.  Vitals reviewed.  Lab Results  Component Value Date   WBC 8.6 12/15/2014   HGB 13.6 12/15/2014   HCT 39 12/15/2014   PLT 287 12/15/2014   GLUCOSE 106* 04/29/2015   CHOL 191 04/29/2015   TRIG 94 04/29/2015   HDL 49 04/29/2015   LDLCALC 123* 04/29/2015   ALT 12 04/29/2015   AST 13 04/29/2015   NA 139 04/29/2015   K 4.1 04/29/2015   CL 102 04/29/2015   CREATININE 0.77 04/29/2015   BUN 10 04/29/2015   CO2 21 04/29/2015   TSH 3.16 11/16/2014   HGBA1C 6.3* 04/29/2015    Assessment & Plan:   Problem List Items Addressed This Visit    Hypertension - Primary    Established problem. Recent elevation, likely secondary to stress. Well controlled at this time.  Reassurance provided. Continue lisinopril/HCTZ.        Follow-up: PRN  Crandall

## 2015-09-16 NOTE — Progress Notes (Signed)
Pre visit review using our clinic review tool, if applicable. No additional management support is needed unless otherwise documented below in the visit note. 

## 2015-10-03 ENCOUNTER — Encounter: Payer: Self-pay | Admitting: Internal Medicine

## 2015-10-04 NOTE — Telephone Encounter (Signed)
See my chart message.  Pt needs appt.  Can you put her on the schedule for 10/06/15 at 2:00 and notify her of appt date and time.  Thanks.

## 2015-10-06 ENCOUNTER — Ambulatory Visit
Admission: RE | Admit: 2015-10-06 | Discharge: 2015-10-06 | Disposition: A | Discharge status: Home / Self Care | Payer: 59 | Source: Ambulatory Visit | Attending: Internal Medicine | Admitting: Internal Medicine

## 2015-10-06 ENCOUNTER — Encounter: Payer: Self-pay | Admitting: Internal Medicine

## 2015-10-06 ENCOUNTER — Ambulatory Visit (INDEPENDENT_AMBULATORY_CARE_PROVIDER_SITE_OTHER): Payer: 59 | Admitting: Internal Medicine

## 2015-10-06 VITALS — BP 112/70 | HR 72 | Temp 98.1°F | Resp 18 | Ht 62.5 in | Wt 229.2 lb

## 2015-10-06 DIAGNOSIS — R0781 Pleurodynia: Secondary | ICD-10-CM

## 2015-10-06 DIAGNOSIS — M546 Pain in thoracic spine: Secondary | ICD-10-CM | POA: Insufficient documentation

## 2015-10-06 NOTE — Progress Notes (Signed)
Pre-visit discussion using our clinic review tool. No additional management support is needed unless otherwise documented below in the visit note.  

## 2015-10-06 NOTE — Progress Notes (Signed)
[name], female   DOB: [birth year], 45 y.o.   MRN: [id number]   Subjective:    [name], female    DOB: [birth year], 45 y.o.   MRN: [id number]  HPI  Patient with past history of hypercholesterolemia, hypertension and hyperglycemia.  She comes in today as a work in to discuss persistent back pain.  States the pain has been present for years.  Started as just noticing at night when lying down.  Now is occurring during the day.  Localized mid back and radiates around under her breast.  Her ribs are also sore.  Notices some pain with sitting and notices pain in ribs with palpation.  No pain with deep breathing.  No cough or congestion.  No sob.  No nausea or vomiting.  No abdominal pain or cramping.  No other symptoms.     Past Medical History  Diagnosis Date  . Hypertension   . Hypercholesterolemia   . Abnormal pap     required freezing  . Microscopic hematuria     s/p renal biopsy  . Thin basement membrane disease    Past Surgical History  Procedure Laterality Date  . Tonsillectomy    . Tubal ligation     Family History  Problem Relation Age of Onset  . Colon cancer Mother     died - age 41  . Hypertension Father   . Colon cancer Maternal Grandfather   . Breast cancer Maternal Aunt 60  . Breast cancer Paternal Aunt 69   Social History   Social History  . Marital Status: Married    Spouse Name: N/A  . Number of Children: 2  . 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 Smoking status: Never Smoker   . Smokeless tobacco: Never Used  . Alcohol Use: No  . Drug Use: No  . Sexual Activity: Not Asked   Other Topics Concern  . None   Social History Narrative    Outpatient Encounter Prescriptions as of 10/06/2015  Medication Sig  . lisinopril-hydrochlorothiazide (PRINZIDE,ZESTORETIC) 20-12.5 MG tablet Take 1 tablet by mouth  daily   No facility-administered encounter medications on file as  of 10/06/2015.    Review of Systems  Constitutional: Negative for appetite change and unexpected weight change.  Respiratory: Negative for cough, chest tightness and shortness of breath.   Cardiovascular: Negative for chest pain, palpitations and leg swelling.  Gastrointestinal: Negative for nausea, vomiting, abdominal pain and diarrhea.  Genitourinary: Negative for dysuria and difficulty urinating.  Musculoskeletal: Positive for back pain.       Pain mid back and radiates around to just under her breast.  Bilateral pain.  Pain with palpation.    Skin: Negative for color change and rash.  Neurological: Negative for headaches.       Objective:    Physical Exam  Constitutional: She appears well-developed and well-nourished. No distress.  Neck: Neck supple.  Cardiovascular: Normal rate and regular rhythm.   Pulmonary/Chest: Breath sounds normal. No respiratory distress. She has no wheezes.  Increased pain with palpation over the lower anterior ribs.    Abdominal: Soft. Bowel sounds are normal. There is no tenderness.  Musculoskeletal: She exhibits no edema or tenderness.  No pain to palpation over the back.   Lymphadenopathy:    She has no cervical adenopathy.  Skin: No rash noted. No erythema.    BP 112/70 mmHg  Pulse 72  Temp(Src) 98.1 F (36.7 C) (Oral)  Resp 18  Ht 5' 2.5" (1.588 m)  Wt 229 lb 4 oz (103.987 kg)  BMI 41.24 kg/m2  SpO2 97% Wt Readings from Last 3 Encounters:  10/06/15 229 lb 4 oz (103.987 kg)  09/16/15 227 lb 8 oz (103.193 kg)  08/19/15 227 lb (102.967 kg)     Lab Results  Component Value Date   WBC 8.6 12/15/2014   HGB 13.6 12/15/2014   HCT 39 12/15/2014   PLT 287 12/15/2014   GLUCOSE 131* 10/07/2015   CHOL 186 10/07/2015   TRIG 96 10/07/2015   HDL 51 10/07/2015   LDLCALC 116* 10/07/2015   ALT 13 10/07/2015   AST 10 10/07/2015   NA 140 10/07/2015   K 4.3 10/07/2015   CL 103 10/07/2015   CREATININE 0.77 10/07/2015   BUN 14 10/07/2015    CO2 21 10/07/2015   TSH 2.220 10/07/2015   HGBA1C 6.2* 10/07/2015    Mm Digital Screening Bilateral  04/19/2015  CLINICAL DATA:  Screening. EXAM: DIGITAL SCREENING BILATERAL MAMMOGRAM WITH CAD COMPARISON:  Previous exam(s). ACR Breast Density Category b: There are scattered areas of fibroglandular density. FINDINGS: There are no findings suspicious for malignancy. Images were processed with CAD. IMPRESSION: No mammographic evidence of malignancy. A result letter of this screening mammogram will be mailed directly to the patient. RECOMMENDATION: Screening mammogram in one year. (Code:SM-B-01Y) BI-RADS CATEGORY  1: Negative. Electronically Signed   By: Margarette Canada M.D.   On: 04/19/2015 08:44       Assessment & Plan:   Problem List Items Addressed This Visit    Rib pain    Back and rib pain as outlined.  Persistent.  Worsening.  No rash.  Some pain to palpation over the ribs.  Check thoracic xray and rib xray.  Check inflammatory markers.  Further w/up pending results.        Relevant Orders   DG Ribs Bilateral (Completed)    Other Visit Diagnoses    Midline thoracic back pain    -  Primary    Relevant Orders    DG Thoracic Spine 2 View (Completed)        Einar Pheasant, MD

## 2015-10-07 ENCOUNTER — Other Ambulatory Visit: Payer: Self-pay | Admitting: Internal Medicine

## 2015-10-08 LAB — HEPATIC FUNCTION PANEL
ALK PHOS: 93 IU/L (ref 39–117)
ALT: 13 IU/L (ref 0–32)
AST: 10 IU/L (ref 0–40)
Albumin: 4 g/dL (ref 3.5–5.5)
BILIRUBIN, DIRECT: 0.08 mg/dL (ref 0.00–0.40)
Bilirubin Total: 0.2 mg/dL (ref 0.0–1.2)
Total Protein: 6.7 g/dL (ref 6.0–8.5)

## 2015-10-08 LAB — LIPID PANEL W/O CHOL/HDL RATIO
Cholesterol, Total: 186 mg/dL (ref 100–199)
HDL: 51 mg/dL (ref >39)
LDL Calculated: 116 mg/dL — ABNORMAL HIGH (ref 0–99)
TRIGLYCERIDES: 96 mg/dL (ref 0–149)
VLDL Cholesterol Cal: 19 mg/dL (ref 5–40)

## 2015-10-08 LAB — BASIC METABOLIC PANEL
BUN/Creatinine Ratio: 18 (ref 9–23)
BUN: 14 mg/dL (ref 6–24)
CHLORIDE: 103 mmol/L (ref 96–106)
CO2: 21 mmol/L (ref 18–29)
CREATININE: 0.77 mg/dL (ref 0.57–1.00)
Calcium: 9.1 mg/dL (ref 8.7–10.2)
GFR calc Af Amer: 109 mL/min/{1.73_m2} (ref >59)
GFR calc non Af Amer: 94 mL/min/{1.73_m2} (ref >59)
Glucose: 131 mg/dL — ABNORMAL HIGH (ref 65–99)
POTASSIUM: 4.3 mmol/L (ref 3.5–5.2)
SODIUM: 140 mmol/L (ref 134–144)

## 2015-10-08 LAB — SEDIMENTATION RATE: Sed Rate: 11 mm/hr (ref 0–32)

## 2015-10-08 LAB — TSH: TSH: 2.22 u[IU]/mL (ref 0.450–4.500)

## 2015-10-08 LAB — HGB A1C W/O EAG: Hgb A1c MFr Bld: 6.2 % — ABNORMAL HIGH (ref 4.8–5.6)

## 2015-10-08 LAB — ANA: Anti Nuclear Antibody(ANA): NEGATIVE

## 2015-10-08 LAB — C-REACTIVE PROTEIN: CRP: 16.2 mg/L — ABNORMAL HIGH (ref 0.0–4.9)

## 2015-10-10 ENCOUNTER — Encounter: Payer: Self-pay | Admitting: Internal Medicine

## 2015-10-10 NOTE — Assessment & Plan Note (Signed)
Back and rib pain as outlined.  Persistent.  Worsening.  No rash.  Some pain to palpation over the ribs.  Check thoracic xray and rib xray.  Check inflammatory markers.  Further w/up pending results.

## 2015-10-11 ENCOUNTER — Encounter: Payer: Self-pay | Admitting: Internal Medicine

## 2015-10-11 NOTE — Telephone Encounter (Signed)
Please call pt.  See lab note from 10/09/15 with my recommendations.  I have addressed these labs.  She needs to be notified of what I have written.

## 2015-10-12 ENCOUNTER — Other Ambulatory Visit: Payer: Self-pay | Admitting: Internal Medicine

## 2015-10-12 DIAGNOSIS — M546 Pain in thoracic spine: Secondary | ICD-10-CM

## 2015-10-12 DIAGNOSIS — R0781 Pleurodynia: Secondary | ICD-10-CM

## 2015-10-12 DIAGNOSIS — R7982 Elevated C-reactive protein (CRP): Secondary | ICD-10-CM

## 2015-10-12 NOTE — Progress Notes (Signed)
Order placed for rheumatology referral.  

## 2015-11-17 ENCOUNTER — Ambulatory Visit: Payer: 59 | Admitting: Internal Medicine

## 2016-03-29 ENCOUNTER — Other Ambulatory Visit (INDEPENDENT_AMBULATORY_CARE_PROVIDER_SITE_OTHER): Payer: 59

## 2016-03-29 ENCOUNTER — Encounter: Payer: Self-pay | Admitting: Obstetrics and Gynecology

## 2016-03-29 ENCOUNTER — Ambulatory Visit (INDEPENDENT_AMBULATORY_CARE_PROVIDER_SITE_OTHER): Payer: 59 | Admitting: Obstetrics and Gynecology

## 2016-03-29 VITALS — BP 106/60 | HR 72 | Ht 62.0 in | Wt 232.3 lb

## 2016-03-29 DIAGNOSIS — N926 Irregular menstruation, unspecified: Secondary | ICD-10-CM

## 2016-03-29 NOTE — Progress Notes (Signed)
Subjective:     [name], female   DOB: 28-May-1971, 45 y.o.   MRN: [id number]  HPI Here due to persistent irregular menses. I saw [name] about three years ago for the same complaint. She states menses are no worse, but no better. Still occurring every 2-3 weeks and lasting 8-16 days, light pink to red to brown spotting. Denies cramping or pains. Does interfere with sex life as she doesn't like to have sex when bleeding.  Review of Systems Denies bowel or bladder changes, fatigue or fever, pain with sex or any other associated symptoms.    Objective:   Physical Exam A&O x4  well groomed obese female in no distress Blood pressure 106/60, pulse 72, height 5\' 2"  (1.575 m), weight 232 lb 4.8 oz (105.4 kg), last menstrual period 03/16/2016. Pelvic exam: normal external genitalia, vulva, vagina, cervix, uterus and adnexa. Scant dark red rubra noted at cervix. Ultrasound done in office today reveals:  Findings:  The uterus measures 10.6 x 5.4 x 5.8 cm. Echo texture is homogeneous without evidence of focal masses.  The Endometrium measures 5.5 mm.  Right Ovary measures 28 x 1.7 x 1.9 cm. It is normal in appearance. Left Ovary measures 3.3 x 2.6 x 2.2 cm. It is normal appearance. Survey of the adnexa demonstrates no adnexal masses. There is no free fluid in the cul de sac.  Impression: 1. WNL    Assessment:     DUB Obesity     Plan:     Counseled on all treatment options for DUB, including HC, IUS, ablation, partial hysterectomy, and expectant management. Desires ablation therapy. Will return at next avalible appointment to discuss with one of the physicians.  Melody Wilton, CNM

## 2016-04-04 ENCOUNTER — Ambulatory Visit: Payer: Self-pay | Admitting: Obstetrics and Gynecology

## 2016-04-16 ENCOUNTER — Other Ambulatory Visit: Payer: Self-pay | Admitting: Internal Medicine

## 2016-04-24 ENCOUNTER — Ambulatory Visit (INDEPENDENT_AMBULATORY_CARE_PROVIDER_SITE_OTHER): Payer: 59 | Admitting: Obstetrics and Gynecology

## 2016-04-24 ENCOUNTER — Encounter: Payer: Self-pay | Admitting: Obstetrics and Gynecology

## 2016-04-24 VITALS — BP 87/58 | HR 63 | Ht 61.5 in | Wt 233.3 lb

## 2016-04-24 DIAGNOSIS — I10 Essential (primary) hypertension: Secondary | ICD-10-CM | POA: Diagnosis not present

## 2016-04-24 DIAGNOSIS — N938 Other specified abnormal uterine and vaginal bleeding: Secondary | ICD-10-CM

## 2016-04-24 DIAGNOSIS — N926 Irregular menstruation, unspecified: Secondary | ICD-10-CM

## 2016-04-24 DIAGNOSIS — I952 Hypotension due to drugs: Secondary | ICD-10-CM | POA: Diagnosis not present

## 2016-04-24 NOTE — Progress Notes (Signed)
GYNECOLOGY PROGRESS NOTE  Subjective:    [name], female    DOB: 1971-07-16, 45 y.o.   MRN: [id number]  HPI  Patient is a 45 y.o. G28P0 female who presents as a referral from [facility] for irregular menses. Patient notes a h/o abnormal menstrual bleeding for 2 years.  Notes having ~ 2 periods per month. LMP was on 04/02/16 which lasted 4-5 days, then again on the 18th, lasting 7-8 days.  Bleeding is light to moderate. Denies cramping or pains. Per [facility], patient would like to discuss endometrial ablation. Has not tried any other interventions to date. Was seen 2 years ago for abnormal bleeding and had negative workup.  At that time patient was counseled on interventions, but declined as she was not sure if no longer desired fertility (despite h/o prior BTL).   The following portions of the patient's history were reviewed and updated as appropriate: allergies, current medications, past family history, past medical history, past social history, past surgical history and problem list.   Review of Systems Pertinent items noted in HPI and remainder of comprehensive ROS otherwise negative.   Objective:   Blood pressure (!) 87/58, pulse 63, height 5' 1.5" (1.562 m), weight 233 lb 4.8 oz (105.8 kg), last menstrual period 04/16/2016. General appearance: alert and no distress Abdomen: soft, non-tender; bowel sounds normal; no masses,  no organomegaly Pelvic: external genitalia normal, rectovaginal septum normal.  Vagina without discharge.  Cervix normal appearing, no lesions and no motion tenderness.  Uterus mobile, nontender, normal shape and size.  Adnexae non-palpable, nontender bilaterally.  Extremities: extremities normal, atraumatic, no cyanosis or edema Neurologic: Grossly normal   Pelvic Ultrasound (03/29/2016):  Findings:  The uterus measures 10.6 x 5.4 x 5.8 cm. Echo texture is homogeneous without evidence of focal masses.  The Endometrium measures 5.5 mm.  Right Ovary  measures 28 x 1.7 x 1.9 cm. It is normal in appearance. Left Ovary measures 3.3 x 2.6 x 2.2 cm. It is normal appearance. Survey of the adnexa demonstrates no adnexal masses. There is no free fluid in the cul de sac.  Impression: 1. WNL       Labs:  Lab Results  Component Value Date   WBC 8.6 12/15/2014   HGB 13.6 12/15/2014   HCT 39 12/15/2014   MCV 86 06/10/2012   PLT 287 12/15/2014     Lab Results  Component Value Date   TSH 2.220 10/07/2015    Assessment:   DUB Obesity Hypotension with h/o HTN  Plan:   - Discussion had with patient regarding endometrial ablation.  Patient notes that she would like to review all treatment options.  Discussed management options for abnormal uterine bleeding including tranexamic acid (Lysteda), oral progesterone (OCPs), Depo Provera, Mirena IUD, endometrial ablation (Novasure/Hydrothermal Ablation) or hysterectomy as definitive surgical management.  Discussed risks and benefits of each method.   Patient desires progesterone OCPs.  Printed patient education handouts were given to the patient to review at home.   Patient will need repeat CBC, and if pills do not work, will likely need to repeat endometrial biopsy prior to trying other alternatives (although recent EM stripe was normal limits).  - cHTN, currently on Zestoretic. BPs low today.  Advised patient to recheck BPs occasionally at home.  If they remain this low, needs to contact PCP about adjusting meds.  - RTC in 2 months to reassess symptoms.   A total of 15 minutes were spent face-to-face with the patient  during this encounter and over half of that time dealt with counseling and coordination of care.  Rubie Maid, MD [facility]

## 2016-04-25 MED ORDER — NORETHINDRONE 0.35 MG PO TABS: 1.0000 | ORAL_TABLET | Freq: Every day | ORAL | 3 refills | Status: DC

## 2016-05-24 ENCOUNTER — Ambulatory Visit (INDEPENDENT_AMBULATORY_CARE_PROVIDER_SITE_OTHER): Payer: 59 | Admitting: Internal Medicine

## 2016-05-24 ENCOUNTER — Telehealth: Payer: Self-pay | Admitting: *Deleted

## 2016-05-24 ENCOUNTER — Encounter: Payer: Self-pay | Admitting: Internal Medicine

## 2016-05-24 VITALS — BP 116/82 | HR 69 | Temp 97.6°F | Wt 228.6 lb

## 2016-05-24 DIAGNOSIS — R0781 Pleurodynia: Secondary | ICD-10-CM | POA: Diagnosis not present

## 2016-05-24 DIAGNOSIS — R739 Hyperglycemia, unspecified: Secondary | ICD-10-CM

## 2016-05-24 DIAGNOSIS — E669 Obesity, unspecified: Secondary | ICD-10-CM

## 2016-05-24 DIAGNOSIS — I10 Essential (primary) hypertension: Secondary | ICD-10-CM

## 2016-05-24 DIAGNOSIS — Z6841 Body Mass Index (BMI) 40.0 and over, adult: Secondary | ICD-10-CM

## 2016-05-24 DIAGNOSIS — E7800 Pure hypercholesterolemia, unspecified: Secondary | ICD-10-CM

## 2016-05-24 DIAGNOSIS — R319 Hematuria, unspecified: Secondary | ICD-10-CM

## 2016-05-24 DIAGNOSIS — IMO0001 Reserved for inherently not codable concepts without codable children: Secondary | ICD-10-CM

## 2016-05-24 DIAGNOSIS — N926 Irregular menstruation, unspecified: Secondary | ICD-10-CM | POA: Diagnosis not present

## 2016-05-24 NOTE — Telephone Encounter (Signed)
Pt will need a physical appt in Dec, please give a time and date, she stated it would need to be before the end of Dec.

## 2016-05-24 NOTE — Progress Notes (Signed)
Pre visit review using our clinic review tool, if applicable. No additional management support is needed unless otherwise documented below in the visit note. 

## 2016-05-24 NOTE — Telephone Encounter (Signed)
Please advise, thanks.  Patient was seen today. thanks

## 2016-05-24 NOTE — Progress Notes (Signed)
[name], female   DOB: April 09, 1971, 45 y.o.   MRN: [id number]   Subjective:    [name], female    DOB: [birth year], 45 y.o.   MRN: [id number]  HPI  Patient here for a scheduled follow up.  She needs a work form completed.   She is accompanied by her husband.  History obtained from both of them. They have adjusted their diet.  Really monitoring carb intake.  Has lost weight.  Plans to start exercising.  Has not started yet.  Feels better.  No having the rib pain like she was previously experiencing.  Joints are better.  Increased energy.  Saw gyn.  Taking ocp's (progesterone only).  Periods were heavy and she would have two periods in a month.     Past Medical History:  Diagnosis Date  . Abnormal pap    required freezing  . Hypercholesterolemia   . Hypertension   . Microscopic hematuria    s/p renal biopsy  . Thin basement membrane disease    Past Surgical History:  Procedure Laterality Date  . TONSILLECTOMY    . TUBAL LIGATION     Family History  Problem Relation Age of Onset  . Colon cancer Mother     died - age 34  . Hypertension Father   . Colon cancer Maternal Grandfather   . Breast cancer Maternal Aunt 60  . Breast cancer Paternal Aunt 61   Social History   Social History  . Marital status: Married    Spouse name: N/A  . Number of children: 2  . 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 Smoking status: Never Smoker  . Smokeless tobacco: Never Used  . Alcohol use No  . Drug use: No  . Sexual activity: Yes    Birth control/ protection: Surgical   Other Topics Concern  . None   Social History Narrative  . None    Outpatient Encounter Prescriptions as of 05/24/2016  Medication Sig  . lisinopril-hydrochlorothiazide (PRINZIDE,ZESTORETIC) 20-12.5 MG tablet Take 1 tablet by mouth  daily  . norethindrone (MICRONOR,CAMILA,ERRIN) 0.35 MG tablet Take 1 tablet (0.35 mg total) by  mouth daily.   No facility-administered encounter medications on file as of 05/24/2016.     Review of Systems  Constitutional: Negative for appetite change and unexpected weight change.  HENT: Negative for congestion and sinus pressure.   Respiratory: Negative for cough, chest tightness and shortness of breath.   Cardiovascular: Negative for chest pain, palpitations and leg swelling.  Gastrointestinal: Negative for abdominal pain, diarrhea, nausea and vomiting.  Musculoskeletal:       Rib pain better - resolved.  Joints better.   Skin: Negative for color change and rash.  Neurological: Negative for dizziness, light-headedness and headaches.  Psychiatric/Behavioral: Negative for agitation and dysphoric mood.       Objective:    Physical Exam  Constitutional: She appears well-developed and well-nourished. No distress.  HENT:  Nose: Nose normal.  Mouth/Throat: Oropharynx is clear and moist.  Neck: Neck supple. No thyromegaly present.  Cardiovascular: Normal rate and regular rhythm.   Pulmonary/Chest: Breath sounds normal. No respiratory distress. She has no wheezes.  Abdominal: Soft. Bowel sounds are normal. There is no tenderness.  Musculoskeletal: She exhibits no edema or tenderness.  Lymphadenopathy:    She has no cervical adenopathy.  Skin: No rash noted. No erythema.  Psychiatric: She has  a normal mood and affect. Her behavior is normal.    BP 116/82   Pulse 69   Temp 97.6 F (36.4 C) (Oral)   Wt 228 lb 9.6 oz (103.7 kg)   LMP 04/16/2016   SpO2 98%   BMI 42.49 kg/m  Wt Readings from Last 3 Encounters:  05/24/16 228 lb 9.6 oz (103.7 kg)  04/24/16 233 lb 4.8 oz (105.8 kg)  03/29/16 232 lb 4.8 oz (105.4 kg)     Lab Results  Component Value Date   WBC 8.6 12/15/2014   HGB 13.6 12/15/2014   HCT 39 12/15/2014   PLT 287 12/15/2014   GLUCOSE 131 (H) 10/07/2015   CHOL 186 10/07/2015   TRIG 96 10/07/2015   HDL 51 10/07/2015   LDLCALC 116 (H) 10/07/2015   ALT 13  10/07/2015   AST 10 10/07/2015   NA 140 10/07/2015   K 4.3 10/07/2015   CL 103 10/07/2015   CREATININE 0.77 10/07/2015   BUN 14 10/07/2015   CO2 21 10/07/2015   TSH 2.220 10/07/2015   HGBA1C 6.2 (H) 10/07/2015    Dg Ribs Bilateral  Result Date: 10/06/2015 CLINICAL DATA:  Anterior rib pain, tender to palpation EXAM: BILATERAL RIBS - 3+ VIEW COMPARISON:  None. FINDINGS: No active infiltrate or effusion is seen. Mediastinal and hilar contours are unremarkable. The heart is within normal limits in size. Bilateral rib detail show no acute bony abnormality. IMPRESSION: 1. No active lung disease. 2. Negative bilateral rib detail. Electronically Signed   By: Ivar Drape M.D.   On: 10/06/2015 09:35   Dg Thoracic Spine 2 View  Result Date: 10/06/2015 CLINICAL DATA:  Persistent mid back pain radiating to the anterior ribs, tender to palpation EXAM: THORACIC SPINE 2 VIEWS COMPARISON:  None. FINDINGS: The thoracic vertebrae are in normal alignment. Intervertebral disc spaces appear normal. No compression deformity is seen. No prominent paravertebral soft tissue is noted. IMPRESSION: Negative. Electronically Signed   By: Ivar Drape M.D.   On: 10/06/2015 09:33       Assessment & Plan:   Problem List Items Addressed This Visit    Hematuria    Has been worked up by nephrology.  Negative renal biopsy.  Presumably felt to be due to thin basement membrane disease.  Follow renal function.        Hypercholesterolemia    Low cholesterol diet and exercise.  Follow lipid panel. Due for recheck.        Relevant Orders   Hepatic function panel   Lipid panel   Hyperglycemia    Low carb diet and exercise.  Follow met b and a1c.        Relevant Orders   Hemoglobin A1c   Microalbumin / creatinine urine ratio   Hypertension    Blood pressure under good control.  Continue same medication regimen.  Follow pressures.  Follow metabolic panel.        Relevant Orders   Basic metabolic panel   CBC with  Differential/Platelet   Irregular menstrual cycle    Seeing gyn.  On progesterone only pills now.  Follow.        Obesity - Primary    Discussed diet and exercise.  Follow.  Form for work completed.       Rib pain    Had previous xray as outlined.  Resolved with weight loss.  Follow.        Other Visit Diagnoses   None.      Pahola Dimmitt,  Randell Patient, MD

## 2016-05-25 NOTE — Telephone Encounter (Signed)
See below , thanks

## 2016-05-25 NOTE — Telephone Encounter (Signed)
Can schedule her at 2:00 on 07/17/16.

## 2016-05-25 NOTE — Telephone Encounter (Signed)
Pt scheduled  

## 2016-05-26 ENCOUNTER — Encounter: Payer: Self-pay | Admitting: Internal Medicine

## 2016-05-26 NOTE — Assessment & Plan Note (Signed)
Low carb diet and exercise.  Follow met b and a1c.   

## 2016-05-26 NOTE — Assessment & Plan Note (Signed)
Seeing gyn.  On progesterone only pills now.  Follow.

## 2016-05-26 NOTE — Assessment & Plan Note (Signed)
Discussed diet and exercise.  Follow.  Form for work completed.

## 2016-05-26 NOTE — Assessment & Plan Note (Signed)
Low cholesterol diet and exercise.  Follow lipid panel. Due for recheck.

## 2016-05-26 NOTE — Assessment & Plan Note (Signed)
Had previous xray as outlined.  Resolved with weight loss.  Follow.

## 2016-05-26 NOTE — Assessment & Plan Note (Signed)
Blood pressure under good control.  Continue same medication regimen.  Follow pressures.  Follow metabolic panel.   

## 2016-05-26 NOTE — Assessment & Plan Note (Signed)
Has been worked up by nephrology.  Negative renal biopsy.  Presumably felt to be due to thin basement membrane disease.  Follow renal function.

## 2016-06-01 LAB — CBC WITH DIFFERENTIAL/PLATELET
BASOS: 1 %
Basophils Absolute: 0.1 10*3/uL (ref 0.0–0.2)
EOS (ABSOLUTE): 0.2 10*3/uL (ref 0.0–0.4)
Eos: 2 %
Hematocrit: 37.6 % (ref 34.0–46.6)
Hemoglobin: 13.1 g/dL (ref 11.1–15.9)
Immature Grans (Abs): 0 10*3/uL (ref 0.0–0.1)
Immature Granulocytes: 0 %
Lymphocytes Absolute: 2.2 10*3/uL (ref 0.7–3.1)
Lymphs: 26 %
MCH: 28.5 pg (ref 26.6–33.0)
MCHC: 34.8 g/dL (ref 31.5–35.7)
MCV: 82 fL (ref 79–97)
MONOS ABS: 0.9 10*3/uL (ref 0.1–0.9)
Monocytes: 11 %
NEUTROS ABS: 4.9 10*3/uL (ref 1.4–7.0)
Neutrophils: 60 %
PLATELETS: 297 10*3/uL (ref 150–379)
RBC: 4.59 x10E6/uL (ref 3.77–5.28)
RDW: 13.4 % (ref 12.3–15.4)
WBC: 8.2 10*3/uL (ref 3.4–10.8)

## 2016-06-01 LAB — HEPATIC FUNCTION PANEL
ALT: 19 IU/L (ref 0–32)
AST: 16 IU/L (ref 0–40)
Albumin: 4.1 g/dL (ref 3.5–5.5)
Alkaline Phosphatase: 69 IU/L (ref 39–117)
BILIRUBIN, DIRECT: 0.09 mg/dL (ref 0.00–0.40)
Bilirubin Total: 0.3 mg/dL (ref 0.0–1.2)
Total Protein: 7 g/dL (ref 6.0–8.5)

## 2016-06-01 LAB — BASIC METABOLIC PANEL
BUN/Creatinine Ratio: 15 (ref 9–23)
BUN: 12 mg/dL (ref 6–24)
CALCIUM: 9.2 mg/dL (ref 8.7–10.2)
CHLORIDE: 102 mmol/L (ref 96–106)
CO2: 20 mmol/L (ref 18–29)
CREATININE: 0.78 mg/dL (ref 0.57–1.00)
GFR calc Af Amer: 107 mL/min/{1.73_m2} (ref >59)
GFR calc non Af Amer: 93 mL/min/{1.73_m2} (ref >59)
GLUCOSE: 107 mg/dL — AB (ref 65–99)
Potassium: 4.6 mmol/L (ref 3.5–5.2)
Sodium: 140 mmol/L (ref 134–144)

## 2016-06-01 LAB — MICROALBUMIN / CREATININE URINE RATIO
CREATININE, UR: 42.6 mg/dL
Microalb/Creat Ratio: <7 mg/g creat (ref 0.0–30.0)

## 2016-06-01 LAB — LIPID PANEL
CHOLESTEROL TOTAL: 178 mg/dL (ref 100–199)
Chol/HDL Ratio: 4 ratio units (ref 0.0–4.4)
HDL: 44 mg/dL (ref >39)
LDL Calculated: 113 mg/dL — ABNORMAL HIGH (ref 0–99)
Triglycerides: 107 mg/dL (ref 0–149)
VLDL Cholesterol Cal: 21 mg/dL (ref 5–40)

## 2016-06-01 LAB — HEMOGLOBIN A1C
Est. average glucose Bld gHb Est-mCnc: 123 mg/dL
HEMOGLOBIN A1C: 5.9 % — AB (ref 4.8–5.6)

## 2016-06-04 ENCOUNTER — Encounter: Payer: Self-pay | Admitting: Internal Medicine

## 2016-06-04 ENCOUNTER — Other Ambulatory Visit: Payer: Self-pay | Admitting: Internal Medicine

## 2016-06-04 DIAGNOSIS — Z1231 Encounter for screening mammogram for malignant neoplasm of breast: Secondary | ICD-10-CM

## 2016-06-15 ENCOUNTER — Other Ambulatory Visit: Payer: Self-pay

## 2016-06-15 DIAGNOSIS — N926 Irregular menstruation, unspecified: Secondary | ICD-10-CM

## 2016-06-15 MED ORDER — NORETHINDRONE 0.35 MG PO TABS: 1.0000 | ORAL_TABLET | Freq: Every day | ORAL | 11 refills | Status: DC

## 2016-06-28 ENCOUNTER — Ambulatory Visit: Payer: 59 | Admitting: Obstetrics and Gynecology

## 2016-07-10 ENCOUNTER — Ambulatory Visit: Payer: 59

## 2016-07-17 ENCOUNTER — Encounter: Payer: Self-pay | Admitting: Internal Medicine

## 2016-07-17 ENCOUNTER — Ambulatory Visit (INDEPENDENT_AMBULATORY_CARE_PROVIDER_SITE_OTHER): Payer: 59 | Admitting: Internal Medicine

## 2016-07-17 VITALS — BP 112/80 | HR 74 | Temp 98.4°F | Ht 62.0 in | Wt 224.6 lb

## 2016-07-17 DIAGNOSIS — E669 Obesity, unspecified: Secondary | ICD-10-CM

## 2016-07-17 DIAGNOSIS — R739 Hyperglycemia, unspecified: Secondary | ICD-10-CM | POA: Diagnosis not present

## 2016-07-17 DIAGNOSIS — I10 Essential (primary) hypertension: Secondary | ICD-10-CM

## 2016-07-17 DIAGNOSIS — Z0000 Encounter for general adult medical examination without abnormal findings: Secondary | ICD-10-CM | POA: Diagnosis not present

## 2016-07-17 DIAGNOSIS — E7800 Pure hypercholesterolemia, unspecified: Secondary | ICD-10-CM

## 2016-07-17 DIAGNOSIS — IMO0001 Reserved for inherently not codable concepts without codable children: Secondary | ICD-10-CM

## 2016-07-17 DIAGNOSIS — Z6841 Body Mass Index (BMI) 40.0 and over, adult: Secondary | ICD-10-CM

## 2016-07-17 NOTE — Progress Notes (Signed)
Pre visit review using our clinic review tool, if applicable. No additional management support is needed unless otherwise documented below in the visit note. 

## 2016-07-17 NOTE — Progress Notes (Signed)
[name], female   DOB: [birth year], 45 y.o.   MRN: [id number]   Subjective:    [name], female    DOB: 06/23/1971, 45 y.o.   MRN: [id number]  HPI  Patient here for her physical exam.  She reports she is doing relatively well.  Trying to adjust her diet.  Has lost some weight.  Feels better.  Discussed diet and exercise. No chest pain.  No sob.  No acid reflux.  No abdominal pain or cramping.  Bowels stable.     Past Medical History:  Diagnosis Date  . Abnormal pap    required freezing  . Hypercholesterolemia   . Hypertension   . Microscopic hematuria    s/p renal biopsy  . Thin basement membrane disease    Past Surgical History:  Procedure Laterality Date  . TONSILLECTOMY    . TUBAL LIGATION     Family History  Problem Relation Age of Onset  . Colon cancer Mother     died - age 17  . Hypertension Father   . Colon cancer Maternal Grandfather   . Breast cancer Maternal Aunt 60  . Breast cancer Paternal Aunt 36   Social History   Social History  . Marital status: Married    Spouse name: N/A  . Number of children: 2  . 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   Social History Main Topics  . Smoking status: Never Smoker  . Smokeless tobacco: Never Used  . Alcohol use No  . Drug use: No  . Sexual activity: Yes    Birth control/ protection: Surgical   Other Topics Concern  . None   Social History Narrative  . None    Outpatient Encounter Prescriptions as of 07/17/2016  Medication Sig  . lisinopril-hydrochlorothiazide (PRINZIDE,ZESTORETIC) 20-12.5 MG tablet Take 1 tablet by mouth  daily  . norethindrone (MICRONOR,CAMILA,ERRIN) 0.35 MG tablet Take 1 tablet (0.35 mg total) by mouth daily.   No facility-administered encounter medications on file as of 07/17/2016.     Review of Systems  Constitutional: Negative for appetite change and unexpected weight change.  HENT: Negative for congestion and sinus  pressure.   Eyes: Negative for pain and visual disturbance.  Respiratory: Negative for cough, chest tightness and shortness of breath.   Cardiovascular: Negative for chest pain, palpitations and leg swelling.  Gastrointestinal: Negative for abdominal pain, diarrhea, nausea and vomiting.  Genitourinary: Negative for difficulty urinating and dysuria.  Musculoskeletal: Negative for back pain and joint swelling.  Skin: Negative for color change and rash.  Neurological: Negative for dizziness, light-headedness and headaches.  Hematological: Negative for adenopathy. Does not bruise/bleed easily.  Psychiatric/Behavioral: Negative for agitation and dysphoric mood.       Objective:     Blood pressure rechecked by me:  118/72  Physical Exam  Constitutional: She is oriented to person, place, and time. She appears well-developed and well-nourished. No distress.  HENT:  Nose: Nose normal.  Mouth/Throat: Oropharynx is clear and moist.  Eyes: Right eye exhibits no discharge. Left eye exhibits no discharge. No scleral icterus.  Neck: Neck supple. No thyromegaly present.  Cardiovascular: Normal rate and regular rhythm.   Pulmonary/Chest: Breath sounds normal. No accessory muscle usage. No tachypnea. No respiratory distress. She has no decreased breath sounds. She has no wheezes. She has no rhonchi. Right breast exhibits no inverted nipple, no mass, no nipple discharge and no tenderness (no axillary adenopathy). Left  breast exhibits no inverted nipple, no mass, no nipple discharge and no tenderness (no axilarry adenopathy).  Abdominal: Soft. Bowel sounds are normal. There is no tenderness.  Musculoskeletal: She exhibits no edema or tenderness.  Lymphadenopathy:    She has no cervical adenopathy.  Neurological: She is alert and oriented to person, place, and time.  Skin: Skin is warm. No rash noted. No erythema.  Psychiatric: She has a normal mood and affect. Her behavior is normal.    BP 112/80    Pulse 74   Temp 98.4 F (36.9 C) (Oral)   Ht 5' 2"  (1.575 m)   Wt 224 lb 9.6 oz (101.9 kg)   SpO2 94%   BMI 41.08 kg/m  Wt Readings from Last 3 Encounters:  07/17/16 224 lb 9.6 oz (101.9 kg)  05/24/16 228 lb 9.6 oz (103.7 kg)  04/24/16 233 lb 4.8 oz (105.8 kg)     Lab Results  Component Value Date   WBC 8.2 05/31/2016   HGB 13.6 12/15/2014   HCT 37.6 05/31/2016   PLT 297 05/31/2016   GLUCOSE 107 (H) 05/31/2016   CHOL 178 05/31/2016   TRIG 107 05/31/2016   HDL 44 05/31/2016   LDLCALC 113 (H) 05/31/2016   ALT 19 05/31/2016   AST 16 05/31/2016   NA 140 05/31/2016   K 4.6 05/31/2016   CL 102 05/31/2016   CREATININE 0.78 05/31/2016   BUN 12 05/31/2016   CO2 20 05/31/2016   TSH 2.220 10/07/2015   HGBA1C 5.9 (H) 05/31/2016    Dg Ribs Bilateral  Result Date: 10/06/2015 CLINICAL DATA:  Anterior rib pain, tender to palpation EXAM: BILATERAL RIBS - 3+ VIEW COMPARISON:  None. FINDINGS: No active infiltrate or effusion is seen. Mediastinal and hilar contours are unremarkable. The heart is within normal limits in size. Bilateral rib detail show no acute bony abnormality. IMPRESSION: 1. No active lung disease. 2. Negative bilateral rib detail. Electronically Signed   By: Ivar Drape M.D.   On: 10/06/2015 09:35   Dg Thoracic Spine 2 View  Result Date: 10/06/2015 CLINICAL DATA:  Persistent mid back pain radiating to the anterior ribs, tender to palpation EXAM: THORACIC SPINE 2 VIEWS COMPARISON:  None. FINDINGS: The thoracic vertebrae are in normal alignment. Intervertebral disc spaces appear normal. No compression deformity is seen. No prominent paravertebral soft tissue is noted. IMPRESSION: Negative. Electronically Signed   By: Ivar Drape M.D.   On: 10/06/2015 09:33       Assessment & Plan:   Problem List Items Addressed This Visit    Health care maintenance    Physical today 07/17/16.  PAP 09/17/13 negative with negative HPV.  She is scheduled for her mammogram in 07/2016.          Hypercholesterolemia    Low cholesterol diet and exercise.  Follow lipid panel.        Relevant Orders   Lipid panel   Hepatic function panel   Hyperglycemia    Low carb diet and exercise.  Follow met b and a1c.        Relevant Orders   Hemoglobin A1c   Hypertension    Blood pressure under good control.  Continue same medication regimen.  Follow pressures.  Follow metabolic panel.        Relevant Orders   Basic metabolic panel   Obesity    Diet and exercise. Follow.            Einar Pheasant, MD

## 2016-07-17 NOTE — Assessment & Plan Note (Addendum)
Physical today 07/17/16.  PAP 09/17/13 negative with negative HPV.  She is scheduled for her mammogram in 07/2016.

## 2016-07-30 ENCOUNTER — Encounter: Payer: Self-pay | Admitting: Internal Medicine

## 2016-07-30 NOTE — Assessment & Plan Note (Signed)
Low cholesterol diet and exercise.  Follow lipid panel.   

## 2016-07-30 NOTE — Assessment & Plan Note (Signed)
Blood pressure under good control.  Continue same medication regimen.  Follow pressures.  Follow metabolic panel.   

## 2016-07-30 NOTE — Assessment & Plan Note (Signed)
Low carb diet and exercise.  Follow met b and a1c.   

## 2016-07-30 NOTE — Assessment & Plan Note (Signed)
Diet and exercise.  Follow.  

## 2016-08-01 ENCOUNTER — Ambulatory Visit (INDEPENDENT_AMBULATORY_CARE_PROVIDER_SITE_OTHER): Payer: 59 | Admitting: Obstetrics and Gynecology

## 2016-08-01 ENCOUNTER — Encounter: Payer: Self-pay | Admitting: Obstetrics and Gynecology

## 2016-08-01 VITALS — BP 118/74 | HR 79 | Ht 62.0 in | Wt 225.3 lb

## 2016-08-01 DIAGNOSIS — Z6841 Body Mass Index (BMI) 40.0 and over, adult: Secondary | ICD-10-CM | POA: Diagnosis not present

## 2016-08-01 DIAGNOSIS — N938 Other specified abnormal uterine and vaginal bleeding: Secondary | ICD-10-CM

## 2016-08-01 DIAGNOSIS — IMO0001 Reserved for inherently not codable concepts without codable children: Secondary | ICD-10-CM

## 2016-08-01 DIAGNOSIS — E6609 Other obesity due to excess calories: Secondary | ICD-10-CM | POA: Diagnosis not present

## 2016-08-01 NOTE — Progress Notes (Signed)
    GYNECOLOGY PROGRESS NOTE  Subjective:    [name], female    DOB: 04/21/1971, 46 y.o.   MRN: [id number]  HPI  Patient is a 46 y.o. G4P0 female who presents for f/u of DUB.  Patient was initiated on progesterone OCPs 2 months ago.  Notes that cycles are now regular again , and also only lasting 3-4 days.  Does note some occasional spotting when wiping.  Overall is pleased with the medication.   The following portions of the patient's history were reviewed and updated as appropriate: allergies, current medications, past family history, past medical history, past social history, past surgical history and problem list.  Review of Systems Pertinent items noted in HPI and remainder of comprehensive ROS otherwise negative.   Objective:   Blood pressure 118/74, pulse 79, height 5\' 2"  (1.575 m), weight 225 lb 4.8 oz (102.2 kg), last menstrual period 07/16/2016.  Body mass index is 41.21 kg/m.   General appearance: alert and no distress Remainder of exam deferred.   Assessment:   DUB Obesity  Plan:   DUB managed well on progesterone OCPs.  Will continue. To f/u in clinic if bleeding becomes irregular or no longer controlled with pills.  Obesity is proabably a contributing factor to her DUB, discussed weight management.  To f/u if symptoms worsen.  Otherwise, OCPs can be managed by PCP as long as she is progressing well.    Rubie Maid, MD [facility]

## 2016-08-09 ENCOUNTER — Ambulatory Visit
Admission: RE | Admit: 2016-08-09 | Discharge: 2016-08-09 | Disposition: A | Discharge status: Home / Self Care | Payer: 59 | Source: Ambulatory Visit | Attending: Internal Medicine | Admitting: Internal Medicine

## 2016-08-09 DIAGNOSIS — Z1231 Encounter for screening mammogram for malignant neoplasm of breast: Secondary | ICD-10-CM | POA: Insufficient documentation

## 2016-08-22 ENCOUNTER — Encounter: Payer: Self-pay | Admitting: Podiatry

## 2016-08-22 ENCOUNTER — Ambulatory Visit (INDEPENDENT_AMBULATORY_CARE_PROVIDER_SITE_OTHER): Payer: 59

## 2016-08-22 ENCOUNTER — Ambulatory Visit (INDEPENDENT_AMBULATORY_CARE_PROVIDER_SITE_OTHER): Payer: 59 | Admitting: Podiatry

## 2016-08-22 VITALS — HR 70 | Resp 16 | Ht 62.0 in | Wt 225.0 lb

## 2016-08-22 DIAGNOSIS — M722 Plantar fascial fibromatosis: Secondary | ICD-10-CM

## 2016-08-22 NOTE — Patient Instructions (Signed)

## 2016-08-22 NOTE — Progress Notes (Signed)
Subjective:     [name], female   DOB: [birth year], 46 y.o.   MRN: [id number]  HPI patient presents with one-year history of severe pain left plantar heel and states that the right one did the same fang and ended up needing surgery. States on the left one that she's had a cortisone injection and is tried reduced activity with inserts without relief and she wants it fixed   Review of Systems  All other systems reviewed and are negative.      Objective:   Physical Exam  Constitutional: She is oriented to person, place, and time.  Cardiovascular: Intact distal pulses.   Musculoskeletal: Normal range of motion.  Neurological: She is oriented to person, place, and time.  Skin: Skin is warm.  Nursing note and vitals reviewed.  neurovascular status intact muscle strength adequate range of motion within normal limits with patient found to have exquisite tenderness plantar aspect left heel insertional point tendon the calcaneus with inflammation fluid buildup noted and a history of surgery on the right heel     Assessment:     Severe long-term plantar fasciitis left that's not respond conservatively with history of surgery on the right by me    Plan:     Reviewed condition and did H&P and x-ray at this time. Due to long-standing nature and her intense discomfort she wants it fixed and is not interested in any form of conservative care. I allowed her to read consent for 4 surgery and explain all possible alternative treatments complications and the fact there is no long-term guarantees. Patient is perfectly comfortable this wants surgery signed consent form and is given all preoperative instructions. Understands recovery can take upwards of 6 months and I did discuss this with patient and also dispensed air fracture walker with instructions on usage  X-ray report indicated that there is spur formation with no indications of stress fracture arthritis

## 2016-08-23 ENCOUNTER — Telehealth: Payer: Self-pay | Admitting: *Deleted

## 2016-08-23 NOTE — Telephone Encounter (Addendum)
Pt wanted to know how long she will be out for her 09/11/2016 surgery. 08/27/2016-Pt called to see how long she will be out after her surgery. Left message informing pt, doctors will not release to work until pt has been evaluated at 1st post op visit, then depending on progress and type of job duties, depending on if pt has sitdown job or  Walking/standing type duties it may be up to 4-6 weeks until pt is allowed to return to work.

## 2016-09-11 ENCOUNTER — Encounter: Payer: Self-pay | Admitting: Podiatry

## 2016-09-11 ENCOUNTER — Telehealth: Payer: Self-pay | Admitting: *Deleted

## 2016-09-11 ENCOUNTER — Other Ambulatory Visit: Payer: Self-pay | Admitting: *Deleted

## 2016-09-11 DIAGNOSIS — M722 Plantar fascial fibromatosis: Secondary | ICD-10-CM | POA: Diagnosis not present

## 2016-09-11 MED ORDER — HYDROCODONE-ACETAMINOPHEN 10-325 MG PO TABS: 1.0000 | ORAL_TABLET | Freq: Four times a day (QID) | ORAL | 0 refills | Status: DC | PRN

## 2016-09-11 NOTE — Telephone Encounter (Addendum)
[name] [address] states pt brought in rx for Vicodin 10/325, but it only comes in Vicodin 10/300, will need Dr. Paulla Dolly to give verbal order for change to Vicodin 10/325 or change script to [facility] 10/325. Dr. Paulla Dolly is not in the office to give verbal order to change. Left message informing pt she would need to pick up another script from the [place] office and I gave the [place] address. Left message informing pt she could pick up a changed script at the [place] office.09/17/2016-Pt states had endoscopic plantar fasciotomy a week ago tomorrow and wanted to know if she had to sleep in the boot. I told pt she should sleep in the boot, it holds the foot in proper position. Pt asked how much is too much walking and I told her for the 1st week prior to 1st post op check she should not be up on the foot or dangling the foot more than 15 minutes/hour. Pt states understanding. 10/01/2016-Jaime - [employer] states they received clinicals for 08/22/2016 and 09/19/2016 and see pt did have surgery, need DOS.10/12/2016-Pt states she had endoscopic plantar fasciotomy 09/11/2016 and tripped over grandson's highchair and foot now burns and hurts and has a knot does she need an appt. Dr. Paulla Dolly states should be okay, rest and ice, and make an appt for 10/15/2016. I informed pt and transferred to schedulers. 10/16/2016-Jamie - [employer] states she is looking for surgery information for pt to process for disability payment.11/12/2016-Pt states she had plantar fascial surgery on 09/11/2016 and wanted to know if what she was feeling in her arch was normal. Pt states after walking or standing for a while she feels she is standing on a rolling pin. I told her that was more localized swelling and to go back in to the boot for comfort, to ice 3-4 times daily and to wear the orthotics and make sure the orthotics are fitting in her shoe properly, and to bring the orthotics to the next appt if need a little adjusting. Pt states  understanding.

## 2016-09-14 ENCOUNTER — Telehealth: Payer: Self-pay

## 2016-09-14 NOTE — Telephone Encounter (Signed)
LVm regarding post op status.

## 2016-09-19 ENCOUNTER — Ambulatory Visit (INDEPENDENT_AMBULATORY_CARE_PROVIDER_SITE_OTHER): Payer: 59 | Admitting: Podiatry

## 2016-09-19 DIAGNOSIS — M722 Plantar fascial fibromatosis: Secondary | ICD-10-CM

## 2016-09-19 NOTE — Progress Notes (Signed)
Subjective:     [name], female   DOB: 03-Dec-1970, 46 y.o.   MRN: [id number]  HPI patient states she's doing real well so far   Review of Systems     Objective:   Physical Exam Neurovascular status intact negative Homans sign noted with patient's left plantar heel doing well with wound edges well coapted and minimal discomfort upon palpation    Assessment:     Doing well post endoscopic release medial fascial band left    Plan:     Reapplied sterile dressing discussed continued compression elevation and dispensed surgical shoe and continue boot usage. Reappoint 2 weeks to removal or earlier if needed

## 2016-10-03 ENCOUNTER — Ambulatory Visit (INDEPENDENT_AMBULATORY_CARE_PROVIDER_SITE_OTHER): Payer: 59 | Admitting: Podiatry

## 2016-10-03 DIAGNOSIS — M722 Plantar fascial fibromatosis: Secondary | ICD-10-CM | POA: Diagnosis not present

## 2016-10-05 NOTE — Progress Notes (Signed)
Subjective:     Patient ID: CHONTEL WARNING, female   DOB: [birth year], 46 y.o.   MRN: [id number]  HPI patient states that overall she's doing well   Review of Systems     Objective:   Physical Exam Neurovascular status intact negative Homans sign noted with wound edges well coapted and good correction of skin and medial lateral side with minimal discomfort plantar heel    Assessment:     Doing well post endoscopic surgery    Plan:     Stitches removed sterile dressings applied and advised on continued compression elevation immobilization and reappoint in the next 4 weeks or earlier if any issues should occur

## 2016-10-15 ENCOUNTER — Ambulatory Visit (INDEPENDENT_AMBULATORY_CARE_PROVIDER_SITE_OTHER): Payer: 59 | Admitting: Podiatry

## 2016-10-15 ENCOUNTER — Ambulatory Visit (INDEPENDENT_AMBULATORY_CARE_PROVIDER_SITE_OTHER): Payer: 59

## 2016-10-15 DIAGNOSIS — Z9889 Other specified postprocedural states: Secondary | ICD-10-CM | POA: Diagnosis not present

## 2016-10-15 DIAGNOSIS — M722 Plantar fascial fibromatosis: Secondary | ICD-10-CM

## 2016-10-17 NOTE — Progress Notes (Signed)
Subjective:     [name], female   DOB: [birth year], 46 y.o.   MRN: [id number]  HPI patient states that she's having quite a bit of discomfort when she gets up after sitting or laying down within the arch and overall her heel feels pretty good   Review of Systems     Objective:   Physical Exam Neurovascular status intact negative Homans sign was noted with patient's mid arch area be moderately tender with no acute inflammation and a small nodule which is probably inflammatory in nature. The heel itself is doing well and healing well    Assessment:     Mid arch fasciitis left    Plan:     H&P condition reviewed and at this time I dispensed a night splint with instructions on usage and instructions on heat ice therapy. This should respond uneventfully but if any issues were to occur she is to reappoint

## 2016-11-12 ENCOUNTER — Encounter: Payer: Self-pay | Admitting: Podiatry

## 2016-11-13 ENCOUNTER — Encounter: Payer: Self-pay | Admitting: Internal Medicine

## 2016-11-13 NOTE — Telephone Encounter (Signed)
If she is having increased pain in hip and thigh and asking for referral, I can refer you to ortho for further evaluation and treatment.  If agreeable, let me know and I will place the order for the referral.  If acute issues, to acute care.

## 2016-11-14 NOTE — Telephone Encounter (Signed)
Left voice mail to call back 

## 2016-11-14 NOTE — Telephone Encounter (Signed)
Spoke with patient states she having  Increased pain in hip and thigh ? Muscle related.  She will just try and make her own appointment .  She will contact us via my chart and let us know if will need to make a referral for her.

## 2016-11-20 ENCOUNTER — Ambulatory Visit: Payer: 59 | Admitting: Internal Medicine

## 2016-12-10 ENCOUNTER — Other Ambulatory Visit: Payer: Self-pay | Admitting: Internal Medicine

## 2017-01-01 ENCOUNTER — Encounter: Payer: Self-pay | Admitting: Podiatry

## 2017-01-01 NOTE — Progress Notes (Signed)
Endoscopic release M&O band (L) heel

## 2017-02-12 ENCOUNTER — Encounter: Payer: Self-pay | Admitting: Internal Medicine

## 2017-02-12 NOTE — Telephone Encounter (Signed)
Given persistent elevation, she needs to be evaluated.  I can see her 02/14/17 at 8:00 - work in for her blood pressure.  Confirm doing ok and ok to wait until 02/14/17.  Let me know if acute problems.

## 2017-02-13 NOTE — Telephone Encounter (Signed)
Tried to reach patient this morning no answer left message to return call to office.

## 2017-02-13 NOTE — Telephone Encounter (Signed)
Patient returned call and has been scheduled.

## 2017-02-14 ENCOUNTER — Encounter: Payer: Self-pay | Admitting: Internal Medicine

## 2017-02-14 ENCOUNTER — Ambulatory Visit (INDEPENDENT_AMBULATORY_CARE_PROVIDER_SITE_OTHER): Payer: 59 | Admitting: Internal Medicine

## 2017-02-14 DIAGNOSIS — R739 Hyperglycemia, unspecified: Secondary | ICD-10-CM | POA: Diagnosis not present

## 2017-02-14 DIAGNOSIS — E7800 Pure hypercholesterolemia, unspecified: Secondary | ICD-10-CM

## 2017-02-14 DIAGNOSIS — I10 Essential (primary) hypertension: Secondary | ICD-10-CM

## 2017-02-14 MED ORDER — LISINOPRIL 40 MG PO TABS: 40.0000 mg | ORAL_TABLET | Freq: Every day | ORAL | 2 refills | Status: DC

## 2017-02-14 MED ORDER — HYDROCHLOROTHIAZIDE 12.5 MG PO CAPS: 12.5000 mg | ORAL_CAPSULE | Freq: Every day | ORAL | 2 refills | Status: DC

## 2017-02-14 NOTE — Patient Instructions (Signed)
Stop lisinopril/hctz  Start lisinopril 40mg  per day and hctz 12.5mg  per day

## 2017-02-14 NOTE — Progress Notes (Addendum)
[name], female   DOB: January 18, 1971, 46 y.o.   MRN: [id number]   Subjective:    [name], female    DOB: January 08, 1971, 46 y.o.   MRN: [id number]  HPI  Patient here as a work in with concerns regarding that her blood pressure has been running higher than on previous checks.  She is accompanied by her husband.  States her blood pressure has been averaging 130-140s/90s.  She saw ortho recently.  Was diagnosed with bursitis.  Was placed on antiinflammatories.  Felt this might be contributing to her elevated blood pressure.  Stopped 2.5 weeks ago.  Still elevated.  Some minimal headache.  States she has always noticed some headache when her blood pressure is elevated.  No unusual headache.  No light headedness or dizziness.  No chest pain.  No sob.  No acid reflux.  No nausea or vomiting.  No abdominal pain.  Bowels moving.     Past Medical History:  Diagnosis Date  . Abnormal pap    required freezing  . Hypercholesterolemia   . Hypertension   . Microscopic hematuria    s/p renal biopsy  . Thin basement membrane disease    Past Surgical History:  Procedure Laterality Date  . TONSILLECTOMY    . TUBAL LIGATION     Family History  Problem Relation Age of Onset  . Colon cancer Mother        died - age 21  . Hypertension Father   . Colon cancer Maternal Grandfather   . Breast cancer Maternal Aunt 60  . Breast cancer Paternal Aunt 53   Social History   Social History  . Marital status: Married    Spouse name: N/A  . Number of children: 2  . 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   Social History Main Topics  . Smoking status: Never Smoker  . Smokeless tobacco: Never Used  . Alcohol use No  . Drug use: No  . Sexual activity: Yes    Birth control/ protection: Surgical, Pill   Other Topics Concern  . None   Social History Narrative  . None    Outpatient Encounter Prescriptions as of 02/14/2017  Medication Sig  .  [DISCONTINUED] lisinopril-hydrochlorothiazide (PRINZIDE,ZESTORETIC) 20-12.5 MG tablet TAKE 1 TABLET BY MOUTH  DAILY  . hydrochlorothiazide (MICROZIDE) 12.5 MG capsule Take 1 capsule (12.5 mg total) by mouth daily.  Marland Kitchen HYDROcodone-acetaminophen (NORCO) 10-325 MG tablet Take 1 tablet by mouth every 6 (six) hours as needed. (Patient not taking: Reported on 02/14/2017)  . lisinopril (PRINIVIL,ZESTRIL) 40 MG tablet Take 1 tablet (40 mg total) by mouth daily.  . norethindrone (MICRONOR,CAMILA,ERRIN) 0.35 MG tablet Take 1 tablet (0.35 mg total) by mouth daily. (Patient not taking: Reported on 08/22/2016)   No facility-administered encounter medications on file as of 02/14/2017.     Review of Systems  Constitutional: Negative for appetite change and unexpected weight change.  HENT: Negative for congestion and sinus pressure.   Respiratory: Negative for cough, chest tightness and shortness of breath.   Cardiovascular: Negative for chest pain, palpitations and leg swelling.  Gastrointestinal: Negative for abdominal pain, diarrhea, nausea and vomiting.  Genitourinary: Negative for difficulty urinating and dysuria.  Musculoskeletal: Negative for back pain and joint swelling.  Skin: Negative for color change and rash.  Neurological: Positive for headaches. Negative for dizziness and light-headedness.  Psychiatric/Behavioral: Negative for agitation and dysphoric mood.  Objective:    Physical Exam  Constitutional: She appears well-developed and well-nourished. No distress.  HENT:  Nose: Nose normal.  Mouth/Throat: Oropharynx is clear and moist.  Neck: Neck supple. No thyromegaly present.  Cardiovascular: Normal rate and regular rhythm.   Pulmonary/Chest: Breath sounds normal. No respiratory distress. She has no wheezes.  Abdominal: Soft. Bowel sounds are normal. There is no tenderness.  Musculoskeletal: She exhibits no edema or tenderness.  Lymphadenopathy:    She has no cervical adenopathy.    Skin: No rash noted. No erythema.  Psychiatric: She has a normal mood and affect. Her behavior is normal.    BP 138/80 (BP Location: Left Arm, Patient Position: Sitting, Cuff Size: Large)   Pulse 74   Temp 98.7 F (37.1 C) (Oral)   Resp 18   Ht 5' 2"  (1.575 m)   Wt 229 lb 4 oz (104 kg)   LMP 01/10/2017 (Approximate)   SpO2 93%   BMI 41.93 kg/m  Wt Readings from Last 3 Encounters:  02/14/17 229 lb 4 oz (104 kg)  08/22/16 225 lb (102.1 kg)  08/01/16 225 lb 4.8 oz (102.2 kg)     Lab Results  Component Value Date   WBC 8.2 05/31/2016   HGB 13.1 05/31/2016   HCT 37.6 05/31/2016   PLT 297 05/31/2016   GLUCOSE 107 (H) 05/31/2016   CHOL 178 05/31/2016   TRIG 107 05/31/2016   HDL 44 05/31/2016   LDLCALC 113 (H) 05/31/2016   ALT 19 05/31/2016   AST 16 05/31/2016   NA 140 05/31/2016   K 4.6 05/31/2016   CL 102 05/31/2016   CREATININE 0.78 05/31/2016   BUN 12 05/31/2016   CO2 20 05/31/2016   TSH 2.220 10/07/2015   HGBA1C 5.9 (H) 05/31/2016    Mm Digital Screening Bilateral  Result Date: 08/10/2016 CLINICAL DATA:  Screening. EXAM: DIGITAL SCREENING BILATERAL MAMMOGRAM WITH CAD COMPARISON:  Previous exam(s). ACR Breast Density Category b: There are scattered areas of fibroglandular density. FINDINGS: There are no findings suspicious for malignancy. Images were processed with CAD. IMPRESSION: No mammographic evidence of malignancy. A result letter of this screening mammogram will be mailed directly to the patient. RECOMMENDATION: Screening mammogram in one year. (Code:SM-B-01Y) BI-RADS CATEGORY  1: Negative. Electronically Signed   By: Curlene Dolphin M.D.   On: 08/10/2016 07:43       Assessment & Plan:   Problem List Items Addressed This Visit    Hypercholesterolemia    Low cholesterol diet and exercise.  Follow lipid panel.        Relevant Medications   lisinopril (PRINIVIL,ZESTRIL) 40 MG tablet   hydrochlorothiazide (MICROZIDE) 12.5 MG capsule   Other Relevant Orders    Hepatic function panel   Lipid panel   Hyperglycemia    Low carb diet and exercise.  Follow met b and a1c.        Relevant Orders   Hemoglobin A1c   Microalbumin / creatinine urine ratio   Hypertension    Blood pressure remaining elevated as outlined.  Will increase lisinopril to 58m q day.  Continue hctz 12.517mq day.  Follow pressures.  Follow metabolic panel.  Avoid antiinflammatories.        Relevant Medications   lisinopril (PRINIVIL,ZESTRIL) 40 MG tablet   hydrochlorothiazide (MICROZIDE) 12.5 MG capsule   Other Relevant Orders   CBC with Differential/Platelet   TSH   Basic metabolic panel       SCEinar PheasantMD

## 2017-02-16 ENCOUNTER — Encounter: Payer: Self-pay | Admitting: Internal Medicine

## 2017-02-16 NOTE — Assessment & Plan Note (Signed)
Low carb diet and exercise.  Follow met b and a1c.   

## 2017-02-16 NOTE — Assessment & Plan Note (Signed)
Low cholesterol diet and exercise.  Follow lipid panel.   

## 2017-02-16 NOTE — Assessment & Plan Note (Addendum)
Blood pressure remaining elevated as outlined.  Will increase lisinopril to 40mg  q day.  Continue hctz 12.5mg  q day.  Follow pressures.  Follow metabolic panel.  Avoid antiinflammatories.

## 2017-02-16 NOTE — Addendum Note (Signed)
Addended by: Alisa Graff on: 02/16/2017 11:09 AM   Modules accepted: Orders

## 2017-03-01 ENCOUNTER — Other Ambulatory Visit (HOSPITAL_COMMUNITY)
Admission: RE | Admit: 2017-03-01 | Discharge: 2017-03-01 | Disposition: A | Discharge status: Home / Self Care | Payer: 59 | Source: Ambulatory Visit | Attending: Internal Medicine | Admitting: Internal Medicine

## 2017-03-01 ENCOUNTER — Ambulatory Visit (INDEPENDENT_AMBULATORY_CARE_PROVIDER_SITE_OTHER): Payer: 59 | Admitting: Internal Medicine

## 2017-03-01 ENCOUNTER — Encounter: Payer: Self-pay | Admitting: Internal Medicine

## 2017-03-01 VITALS — BP 124/78 | HR 72 | Temp 98.5°F | Ht 62.0 in | Wt 225.8 lb

## 2017-03-01 DIAGNOSIS — M79605 Pain in left leg: Secondary | ICD-10-CM

## 2017-03-01 DIAGNOSIS — Z6841 Body Mass Index (BMI) 40.0 and over, adult: Secondary | ICD-10-CM | POA: Diagnosis not present

## 2017-03-01 DIAGNOSIS — Z0000 Encounter for general adult medical examination without abnormal findings: Secondary | ICD-10-CM

## 2017-03-01 DIAGNOSIS — R739 Hyperglycemia, unspecified: Secondary | ICD-10-CM

## 2017-03-01 DIAGNOSIS — Z124 Encounter for screening for malignant neoplasm of cervix: Secondary | ICD-10-CM | POA: Diagnosis not present

## 2017-03-01 DIAGNOSIS — I10 Essential (primary) hypertension: Secondary | ICD-10-CM | POA: Diagnosis not present

## 2017-03-01 DIAGNOSIS — R319 Hematuria, unspecified: Secondary | ICD-10-CM

## 2017-03-01 DIAGNOSIS — M79604 Pain in right leg: Secondary | ICD-10-CM

## 2017-03-01 DIAGNOSIS — N926 Irregular menstruation, unspecified: Secondary | ICD-10-CM

## 2017-03-01 DIAGNOSIS — E7800 Pure hypercholesterolemia, unspecified: Secondary | ICD-10-CM

## 2017-03-01 LAB — BASIC METABOLIC PANEL
BUN/Creatinine Ratio: 19 (ref 9–23)
BUN: 17 mg/dL (ref 6–24)
CALCIUM: 10.1 mg/dL (ref 8.7–10.2)
CO2: 23 mmol/L (ref 20–29)
CREATININE: 0.91 mg/dL (ref 0.57–1.00)
Chloride: 100 mmol/L (ref 96–106)
GFR, EST AFRICAN AMERICAN: 88 mL/min/{1.73_m2} (ref >59)
GFR, EST NON AFRICAN AMERICAN: 76 mL/min/{1.73_m2} (ref >59)
Glucose: 100 mg/dL — ABNORMAL HIGH (ref 65–99)
POTASSIUM: 3.7 mmol/L (ref 3.5–5.2)
Sodium: 140 mmol/L (ref 134–144)

## 2017-03-01 LAB — CBC WITH DIFFERENTIAL/PLATELET
BASOS ABS: 0.1 10*3/uL (ref 0.0–0.2)
Basos: 1 %
EOS (ABSOLUTE): 0.2 10*3/uL (ref 0.0–0.4)
Eos: 2 %
Hematocrit: 40 % (ref 34.0–46.6)
Hemoglobin: 13.2 g/dL (ref 11.1–15.9)
IMMATURE GRANULOCYTES: 1 %
Immature Grans (Abs): 0.1 10*3/uL (ref 0.0–0.1)
LYMPHS ABS: 2.9 10*3/uL (ref 0.7–3.1)
Lymphs: 28 %
MCH: 28.6 pg (ref 26.6–33.0)
MCHC: 33 g/dL (ref 31.5–35.7)
MCV: 87 fL (ref 79–97)
MONOS ABS: 0.9 10*3/uL (ref 0.1–0.9)
Monocytes: 9 %
NEUTROS PCT: 59 %
Neutrophils Absolute: 6.1 10*3/uL (ref 1.4–7.0)
PLATELETS: 302 10*3/uL (ref 150–379)
RBC: 4.62 x10E6/uL (ref 3.77–5.28)
RDW: 14 % (ref 12.3–15.4)
WBC: 10.2 10*3/uL (ref 3.4–10.8)

## 2017-03-01 LAB — HEPATIC FUNCTION PANEL
ALT: 19 IU/L (ref 0–32)
AST: 19 IU/L (ref 0–40)
Albumin: 4.5 g/dL (ref 3.5–5.5)
Alkaline Phosphatase: 73 IU/L (ref 39–117)
Bilirubin Total: 0.2 mg/dL (ref 0.0–1.2)
Bilirubin, Direct: 0.08 mg/dL (ref 0.00–0.40)
TOTAL PROTEIN: 7.3 g/dL (ref 6.0–8.5)

## 2017-03-01 LAB — LIPID PANEL
CHOLESTEROL TOTAL: 192 mg/dL (ref 100–199)
Chol/HDL Ratio: 3.8 ratio (ref 0.0–4.4)
HDL: 50 mg/dL (ref >39)
LDL CALC: 107 mg/dL — AB (ref 0–99)
TRIGLYCERIDES: 177 mg/dL — AB (ref 0–149)
VLDL CHOLESTEROL CAL: 35 mg/dL (ref 5–40)

## 2017-03-01 LAB — HEMOGLOBIN A1C
ESTIMATED AVERAGE GLUCOSE: 126 mg/dL
HEMOGLOBIN A1C: 6 % — AB (ref 4.8–5.6)

## 2017-03-01 LAB — MICROALBUMIN / CREATININE URINE RATIO
Creatinine, Urine: 121.1 mg/dL
MICROALB/CREAT RATIO: 10.1 mg/g{creat} (ref 0.0–30.0)
MICROALBUM., U, RANDOM: 12.2 ug/mL

## 2017-03-01 LAB — TSH: TSH: 1.99 u[IU]/mL (ref 0.450–4.500)

## 2017-03-01 NOTE — Progress Notes (Signed)
Pre visit review using our clinic review tool, if applicable. No additional management support is needed unless otherwise documented below in the visit note. 

## 2017-03-01 NOTE — Progress Notes (Signed)
[name], female   DOB: 1971/06/07, 46 y.o.   MRN: [id number]   Subjective:    [name], female    DOB: 01-Aug-1970, 46 y.o.   MRN: [id number]  HPI  Patient here for her physical exam.  She reports she is doing relatively well.  Discussed her recent labs.  Discussed diet and exercise.  Discussed weight loss.  Low carb diet.  No chest pain.  No sob.  No acid reflux.  No abdominal pain.  Bowels moving.  No urine change.  Blood pressure overall improved.  Increased stress with her work situation.  Should get better soon.  Does report leg aching.     Past Medical History:  Diagnosis Date  . Abnormal pap    required freezing  . Hypercholesterolemia   . Hypertension   . Microscopic hematuria    s/p renal biopsy  . Thin basement membrane disease    Past Surgical History:  Procedure Laterality Date  . TONSILLECTOMY    . TUBAL LIGATION     Family History  Problem Relation Age of Onset  . Colon cancer Mother        died - age 55  . Hypertension Father   . Colon cancer Maternal Grandfather   . Breast cancer Maternal Aunt 60  . Breast cancer Paternal Aunt 62   Social History   Social History  . Marital status: Married    Spouse name: N/A  . Number of children: 2  . 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   Social History Main Topics  . Smoking status: Never Smoker  . Smokeless tobacco: Never Used  . Alcohol use No  . Drug use: No  . Sexual activity: Yes    Birth control/ protection: Surgical, Pill   Other Topics Concern  . None   Social History Narrative  . None    Outpatient Encounter Prescriptions as of 03/01/2017  Medication Sig  . hydrochlorothiazide (MICROZIDE) 12.5 MG capsule Take 1 capsule (12.5 mg total) by mouth daily.  Marland Kitchen HYDROcodone-acetaminophen (NORCO) 10-325 MG tablet Take 1 tablet by mouth every 6 (six) hours as needed. (Patient not taking: Reported on 02/14/2017)  . lisinopril (PRINIVIL,ZESTRIL) 40 MG  tablet Take 1 tablet (40 mg total) by mouth daily.  . norethindrone (MICRONOR,CAMILA,ERRIN) 0.35 MG tablet Take 1 tablet (0.35 mg total) by mouth daily. (Patient not taking: Reported on 08/22/2016)   No facility-administered encounter medications on file as of 03/01/2017.     Review of Systems  Constitutional: Negative for appetite change and unexpected weight change.  HENT: Negative for congestion and sinus pressure.   Eyes: Negative for pain and visual disturbance.  Respiratory: Negative for cough, chest tightness and shortness of breath.   Cardiovascular: Negative for chest pain, palpitations and leg swelling.  Gastrointestinal: Negative for abdominal pain, constipation and diarrhea.  Genitourinary: Negative for difficulty urinating and dysuria.  Musculoskeletal: Negative for back pain and joint swelling.       Leg aching as outlined.    Skin: Negative for color change and rash.  Neurological: Negative for dizziness, light-headedness and headaches.  Hematological: Negative for adenopathy. Does not bruise/bleed easily.  Psychiatric/Behavioral: Negative for agitation and dysphoric mood.       Objective:    Physical Exam  Constitutional: She appears well-developed and well-nourished. No distress.  HENT:  Nose: Nose normal.  Mouth/Throat: Oropharynx is clear and moist.  Eyes: Conjunctivae are normal. Right  eye exhibits no discharge. Left eye exhibits no discharge.  Neck: Neck supple. No thyromegaly present.  Cardiovascular: Normal rate and regular rhythm.   Pulmonary/Chest: Breath sounds normal. No respiratory distress. She has no wheezes.  Abdominal: Soft. Bowel sounds are normal. There is no tenderness.  Genitourinary:  Genitourinary Comments: Normal external genitalia.  Vaginal vault without lesions.  Cervix identified.  Pap smear performed.  Stenotic os.  Could not appreciate any adnexal masses or tenderness.    Musculoskeletal: She exhibits no edema or tenderness.    Lymphadenopathy:    She has no cervical adenopathy.  Skin: No rash noted. No erythema.  Psychiatric: She has a normal mood and affect. Her behavior is normal.    BP 124/78   Pulse 72   Temp 98.5 F (36.9 C) (Oral)   Ht 5' 2"  (1.575 m)   Wt 225 lb 12.8 oz (102.4 kg)   LMP  (LMP Unknown)   SpO2 97%   BMI 41.30 kg/m  Wt Readings from Last 3 Encounters:  03/01/17 225 lb 12.8 oz (102.4 kg)  02/14/17 229 lb 4 oz (104 kg)  08/22/16 225 lb (102.1 kg)     Lab Results  Component Value Date   WBC 10.2 02/28/2017   HGB 13.2 02/28/2017   HCT 40.0 02/28/2017   PLT 302 02/28/2017   GLUCOSE 100 (H) 02/28/2017   CHOL 192 02/28/2017   TRIG 177 (H) 02/28/2017   HDL 50 02/28/2017   LDLCALC 107 (H) 02/28/2017   ALT 19 02/28/2017   AST 19 02/28/2017   NA 140 02/28/2017   K 3.7 02/28/2017   CL 100 02/28/2017   CREATININE 0.91 02/28/2017   BUN 17 02/28/2017   CO2 23 02/28/2017   TSH 1.990 02/28/2017   HGBA1C 6.0 (H) 02/28/2017    Mm Digital Screening Bilateral  Result Date: 08/10/2016 CLINICAL DATA:  Screening. EXAM: DIGITAL SCREENING BILATERAL MAMMOGRAM WITH CAD COMPARISON:  Previous exam(s). ACR Breast Density Category b: There are scattered areas of fibroglandular density. FINDINGS: There are no findings suspicious for malignancy. Images were processed with CAD. IMPRESSION: No mammographic evidence of malignancy. A result letter of this screening mammogram will be mailed directly to the patient. RECOMMENDATION: Screening mammogram in one year. (Code:SM-B-01Y) BI-RADS CATEGORY  1: Negative. Electronically Signed   By: Curlene Dolphin M.D.   On: 08/10/2016 07:43       Assessment & Plan:   Problem List Items Addressed This Visit    BMI 40.0-44.9, adult ([place])    Discussed diet and exercise.  Follow.        Health care maintenance    Had physical 07/17/16.  Not due for physical today.  PAP today 03/01/17.  Mammogram 08/10/16 - Birads I.       Hematuria    Has been worked up by  nephrology.  Negative renal biopsy.  Felt to be due to thin basement membrane disease.  Follow renal function.        Hypercholesterolemia    Low cholesterol diet and exercise.  Follow lipid panel.   Lab Results  Component Value Date   CHOL 192 02/28/2017   HDL 50 02/28/2017   LDLCALC 107 (H) 02/28/2017   TRIG 177 (H) 02/28/2017   CHOLHDL 3.8 02/28/2017        Hyperglycemia    Low carb diet and exercise.  Follow met b and a1c.   Lab Results  Component Value Date   HGBA1C 6.0 (H) 02/28/2017  Hypertension    Blood pressure under good control.  Continue same medication regimen.  Follow pressures.  Follow metabolic panel.        Irregular menstrual cycle    Has seen gyn.  Skipped one period recently.  Denies possibility of being pregnant.  Is s/p BTL.  Follow.         Other Visit Diagnoses    Pain in both lower extremities    -  Primary   Relevant Orders   Sedimentation rate   CK (Creatine Kinase)   Screening for cervical cancer       Relevant Orders   Cytology - PAP   Menstrual irregularity       previously skipped one period.  have her keep menstrual diary.  follow.        Einar Pheasant, MD

## 2017-03-03 ENCOUNTER — Encounter: Payer: Self-pay | Admitting: Internal Medicine

## 2017-03-03 NOTE — Assessment & Plan Note (Signed)
Low cholesterol diet and exercise.  Follow lipid panel.   Lab Results  Component Value Date   CHOL 192 02/28/2017   HDL 50 02/28/2017   LDLCALC 107 (H) 02/28/2017   TRIG 177 (H) 02/28/2017   CHOLHDL 3.8 02/28/2017

## 2017-03-03 NOTE — Assessment & Plan Note (Signed)
Has been worked up by nephrology.  Negative renal biopsy.  Felt to be due to thin basement membrane disease.  Follow renal function.

## 2017-03-03 NOTE — Assessment & Plan Note (Signed)
Blood pressure under good control.  Continue same medication regimen.  Follow pressures.  Follow metabolic panel.   

## 2017-03-03 NOTE — Assessment & Plan Note (Signed)
Has seen gyn.  Skipped one period recently.  Denies possibility of being pregnant.  Is s/p BTL.  Follow.

## 2017-03-03 NOTE — Assessment & Plan Note (Signed)
Discussed diet and exercise.  Follow.  

## 2017-03-03 NOTE — Assessment & Plan Note (Signed)
Had physical 07/17/16.  Not due for physical today.  PAP today 03/01/17.  Mammogram 08/10/16 - Birads I.

## 2017-03-03 NOTE — Assessment & Plan Note (Signed)
Low carb diet and exercise.  Follow met b and a1c.   Lab Results  Component Value Date   HGBA1C 6.0 (H) 02/28/2017

## 2017-03-05 ENCOUNTER — Encounter: Payer: Self-pay | Admitting: Internal Medicine

## 2017-03-05 LAB — CYTOLOGY - PAP
DIAGNOSIS: NEGATIVE
HPV (WINDOPATH): NOT DETECTED

## 2017-03-27 ENCOUNTER — Telehealth: Payer: Self-pay | Admitting: Internal Medicine

## 2017-03-27 NOTE — Telephone Encounter (Signed)
Spoke with patient and she says when rash started it was red bumps in the back of the mouth and inside of lips. Now Inside of mouth is red all over and itching. Patient cannot tell if the rash is gone or not. Anything acidic burns. Magic mouthwash was [facility] at [facility]. Patient states that she was not itching before she started the magic mouthwash. Advised patient to go back to walk in to be re-evaluated.

## 2017-03-27 NOTE — Telephone Encounter (Signed)
Pt called and stated that she went to [facility] on Sunday woth a rash on the roof of her mouth, they diagnosed it as Stomatitis. They gave her the mouth mouthwash but pt is saying that now her mouth is really itchy. The bumps are gone. Please advise, thank you!  Call pt @ [phone]

## 2017-03-28 NOTE — Telephone Encounter (Signed)
Spoke with patient again today, she did not go to be re-evaluated. She says that the itching has stopped but the redness and rash has spread to the inside of her top lip. Patient stated she wanted to come in and be seen by you or one of the providers in the office but I advised that she go to the walk in today because we have no openings for her to be seen here. She stated she would go to [facility] today. Will follow up.

## 2017-03-28 NOTE — Telephone Encounter (Signed)
Agree with need for reevaluation.  Please confirm pt was seen.

## 2017-04-24 ENCOUNTER — Other Ambulatory Visit: Payer: Self-pay

## 2017-04-24 MED ORDER — LISINOPRIL 40 MG PO TABS: 40.0000 mg | ORAL_TABLET | Freq: Every day | ORAL | 0 refills | Status: DC

## 2017-04-29 ENCOUNTER — Telehealth: Payer: Self-pay | Admitting: Internal Medicine

## 2017-04-29 NOTE — Telephone Encounter (Signed)
Placed in red folder  

## 2017-04-29 NOTE — Telephone Encounter (Signed)
Pt dropped off health screening form to be filled out. Placed in Dr. Bary Leriche colored folder upfront. Pt will pickup when completed

## 2017-04-30 NOTE — Telephone Encounter (Signed)
Form completed and placed in box.  

## 2017-04-30 NOTE — Telephone Encounter (Signed)
Patient informed left at reception.

## 2017-05-02 ENCOUNTER — Encounter: Payer: Self-pay | Admitting: Internal Medicine

## 2017-05-02 NOTE — Telephone Encounter (Signed)
Please schedule patient at 12:15 tomorrow per Dr.Scott. For UTI. Patient aware of date/time.

## 2017-05-02 NOTE — Telephone Encounter (Signed)
Patient scheduled.

## 2017-05-02 NOTE — Telephone Encounter (Signed)
If she is having persistent symptoms, I can work her in tomorrow to check her.  Work in for this.  Have her come in 12:15 tomorrow.

## 2017-05-03 ENCOUNTER — Ambulatory Visit (INDEPENDENT_AMBULATORY_CARE_PROVIDER_SITE_OTHER): Payer: 59 | Admitting: Internal Medicine

## 2017-05-03 ENCOUNTER — Encounter: Payer: Self-pay | Admitting: Internal Medicine

## 2017-05-03 VITALS — BP 132/78 | HR 83 | Temp 98.6°F | Resp 14 | Wt 233.8 lb

## 2017-05-03 DIAGNOSIS — I10 Essential (primary) hypertension: Secondary | ICD-10-CM | POA: Diagnosis not present

## 2017-05-03 DIAGNOSIS — R300 Dysuria: Secondary | ICD-10-CM

## 2017-05-03 LAB — POCT URINALYSIS DIP (MANUAL ENTRY)
Bilirubin, UA: NEGATIVE
Glucose, UA: NEGATIVE mg/dL
Ketones, POC UA: NEGATIVE mg/dL
Nitrite, UA: NEGATIVE
Protein Ur, POC: NEGATIVE mg/dL
Spec Grav, UA: <=1.005 — AB
Urobilinogen, UA: 0.2 U/dL
pH, UA: 5.5

## 2017-05-03 LAB — URINALYSIS, MICROSCOPIC ONLY

## 2017-05-03 MED ORDER — PHENAZOPYRIDINE HCL 200 MG PO TABS: 200.0000 mg | ORAL_TABLET | Freq: Three times a day (TID) | ORAL | 0 refills | Status: DC | PRN

## 2017-05-03 NOTE — Progress Notes (Signed)
[name], female   DOB: Oct 20, 1970, 46 y.o.   MRN: [id number]   Subjective:    [name], female    DOB: 07/18/1971, 46 y.o.   MRN: [id number]  HPI  Patient here as a work in with concerns regarding persistent urinary tract infection.  She was initially seen on 04/03/17 at acute care.  Diagnosed with uti.  Treated with macrobid.  No improvement in symptoms.  Called back to acute care and abx changed to keflex.  State was better, but symptoms never completely resolved.  States that starting 2-3 days ago, symptoms started worsening.  Some dysuria.  Eating and drinking.  No back pain.  No increased abdominal pain.  Some increased frequency and odor.     Past Medical History:  Diagnosis Date  . Abnormal pap    required freezing  . Hypercholesterolemia   . Hypertension   . Microscopic hematuria    s/p renal biopsy  . Thin basement membrane disease    Past Surgical History:  Procedure Laterality Date  . TONSILLECTOMY    . TUBAL LIGATION     Family History  Problem Relation Age of Onset  . Colon cancer Mother        died - age 51  . Hypertension Father   . Colon cancer Maternal Grandfather   . Breast cancer Maternal Aunt 60  . Breast cancer Paternal Aunt 22   Social History   Social History  . Marital status: Married    Spouse name: N/A  . Number of children: 2  . 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   Social History Main Topics  . Smoking status: Never Smoker  . Smokeless tobacco: Never Used  . Alcohol use No  . Drug use: No  . Sexual activity: Yes    Birth control/ protection: Surgical, Pill   Other Topics Concern  . None   Social History Narrative  . None    Outpatient Encounter Prescriptions as of 05/03/2017  Medication Sig  . hydrochlorothiazide (MICROZIDE) 12.5 MG capsule Take 1 capsule (12.5 mg total) by mouth daily.  Marland Kitchen lisinopril (PRINIVIL,ZESTRIL) 40 MG tablet Take 1 tablet (40 mg total) by mouth  daily.  . phenazopyridine (PYRIDIUM) 200 MG tablet Take 1 tablet (200 mg total) by mouth 3 (three) times daily as needed for pain.  . [DISCONTINUED] HYDROcodone-acetaminophen (NORCO) 10-325 MG tablet Take 1 tablet by mouth every 6 (six) hours as needed. (Patient not taking: Reported on 02/14/2017)  . [DISCONTINUED] norethindrone (MICRONOR,CAMILA,ERRIN) 0.35 MG tablet Take 1 tablet (0.35 mg total) by mouth daily. (Patient not taking: Reported on 08/22/2016)   No facility-administered encounter medications on file as of 05/03/2017.     Review of Systems  Constitutional: Negative for appetite change and fever.  Gastrointestinal: Negative for abdominal pain, diarrhea, nausea and vomiting.  Genitourinary: Positive for frequency.       Urinary odor.    Musculoskeletal: Negative for back pain and myalgias.  Psychiatric/Behavioral: Negative for agitation and dysphoric mood.       Objective:    Physical Exam  Constitutional: She appears well-developed and well-nourished. No distress.  HENT:  Mouth/Throat: Oropharynx is clear and moist.  Cardiovascular: Normal rate and regular rhythm.   Pulmonary/Chest: Breath sounds normal. No respiratory distress. She has no wheezes.  Abdominal: Soft. Bowel sounds are normal. There is no tenderness.  Musculoskeletal: She exhibits no edema or tenderness.  Psychiatric: She has a  normal mood and affect. Her behavior is normal.    BP 132/78   Pulse 83   Temp 98.6 F (37 C) (Oral)   Resp 14   Wt 233 lb 12.8 oz (106.1 kg)   LMP 04/21/2017   SpO2 97%   BMI 42.76 kg/m  Wt Readings from Last 3 Encounters:  05/03/17 233 lb 12.8 oz (106.1 kg)  03/01/17 225 lb 12.8 oz (102.4 kg)  02/14/17 229 lb 4 oz (104 kg)     Lab Results  Component Value Date   WBC 10.2 02/28/2017   HGB 13.2 02/28/2017   HCT 40.0 02/28/2017   PLT 302 02/28/2017   GLUCOSE 100 (H) 02/28/2017   CHOL 192 02/28/2017   TRIG 177 (H) 02/28/2017   HDL 50 02/28/2017   LDLCALC 107 (H)  02/28/2017   ALT 19 02/28/2017   AST 19 02/28/2017   NA 140 02/28/2017   K 3.7 02/28/2017   CL 100 02/28/2017   CREATININE 0.91 02/28/2017   BUN 17 02/28/2017   CO2 23 02/28/2017   TSH 1.990 02/28/2017   HGBA1C 6.0 (H) 02/28/2017       Assessment & Plan:   Problem List Items Addressed This Visit    Hypertension    Blood pressure on recheck improved.  Have her spot check her pressure.  Follow.  Same medication regimen.         Other Visit Diagnoses    Dysuria    -  Primary   Symptoms as outlined.  Will check urinalysis and culture.  Hold abx until culture results return.  Pt agrees.  Will notify if symptoms persist.     Relevant Orders   POCT urinalysis dipstick (Completed)   Urine Culture (Completed)   Urine Microscopic Only (Completed)       Einar Pheasant, MD

## 2017-05-04 ENCOUNTER — Telehealth: Payer: Self-pay | Admitting: Internal Medicine

## 2017-05-04 NOTE — Telephone Encounter (Signed)
My chart message sent to pt for update.   

## 2017-05-05 ENCOUNTER — Encounter: Payer: Self-pay | Admitting: Internal Medicine

## 2017-05-06 ENCOUNTER — Encounter: Payer: Self-pay | Admitting: Internal Medicine

## 2017-05-06 LAB — URINE CULTURE
MICRO NUMBER:: 81110561
SPECIMEN QUALITY: ADEQUATE

## 2017-05-06 NOTE — Assessment & Plan Note (Signed)
Blood pressure on recheck improved.  Have her spot check her pressure.  Follow.  Same medication regimen.

## 2017-05-07 MED ORDER — CIPROFLOXACIN HCL 500 MG PO TABS: 500.0000 mg | ORAL_TABLET | Freq: Two times a day (BID) | ORAL | 0 refills | Status: DC

## 2017-05-07 NOTE — Telephone Encounter (Signed)
rx sent in for cipro.  Pt notified via my chart.

## 2017-05-09 ENCOUNTER — Other Ambulatory Visit: Payer: Self-pay

## 2017-05-09 MED ORDER — HYDROCHLOROTHIAZIDE 12.5 MG PO CAPS: 12.5000 mg | ORAL_CAPSULE | Freq: Every day | ORAL | 0 refills | Status: DC

## 2017-05-16 ENCOUNTER — Encounter: Payer: Self-pay | Admitting: Internal Medicine

## 2017-05-16 DIAGNOSIS — R5383 Other fatigue: Secondary | ICD-10-CM

## 2017-05-16 DIAGNOSIS — M791 Myalgia, unspecified site: Secondary | ICD-10-CM

## 2017-05-17 NOTE — Telephone Encounter (Signed)
Please call pt.  See her my chart message.  She had reported some leg aching previously - back at her 02/2017 visit.  I had ordered labs to be drawn at [facility] - (esr and ck - to see if inflammation and any muscle damage).  It appears she never had these drawn.  I recently saw her for uti.  She had already been seen and given two abx.  Her urine culture was positive and I gave her cipro.  She is questioning if aching and fatigue from virus or recent infection.  Unclear as to exact etiology.  Is she having any fever? What specific symptoms?  If acute symptoms, would recommend evaluation at acute care now.  I can check labs (including the ones for inflammation mentioned earlier).  Just let me know and I will order.  If persistent problems will need to be evaluated.

## 2017-05-17 NOTE — Telephone Encounter (Signed)
Orders placed for labs

## 2017-05-23 IMAGING — CR DG LUMBAR SPINE 2-3V
1 series · 3 of 3 positions shown · non-contrast
Comparison: None.

CLINICAL DATA: Low back pain since this morning, no radiation. No
injury.

EXAM:
LUMBAR SPINE - 2-3 VIEW

[Series 1: dg lumbar spine 2-3 views · 0.14mm/px · 3 of 3 slices shown]
[im 1/3]
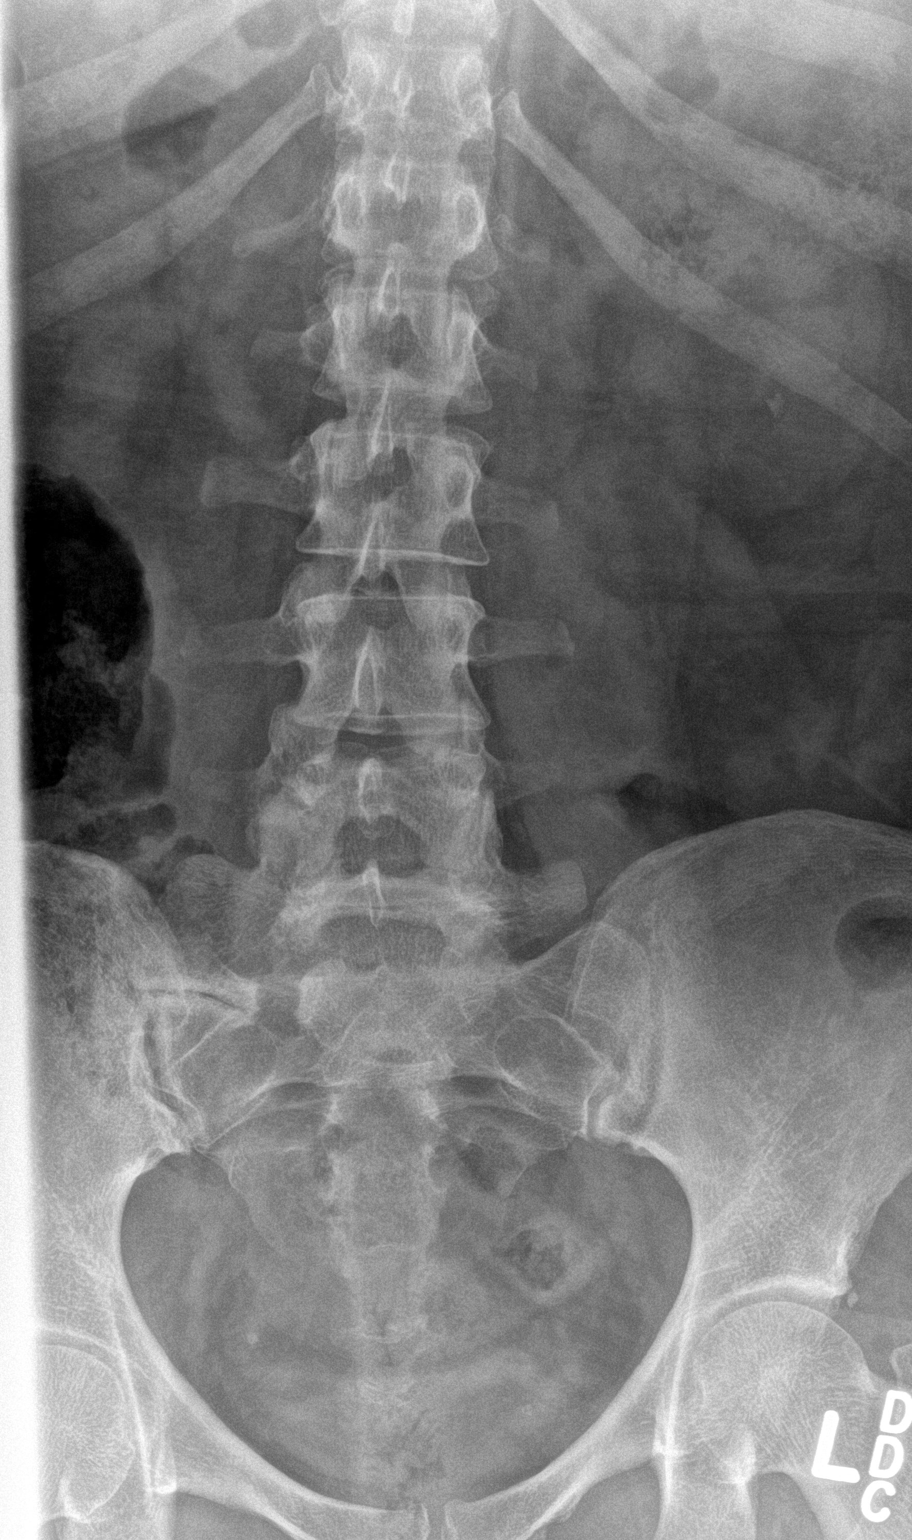
[im 2/3]
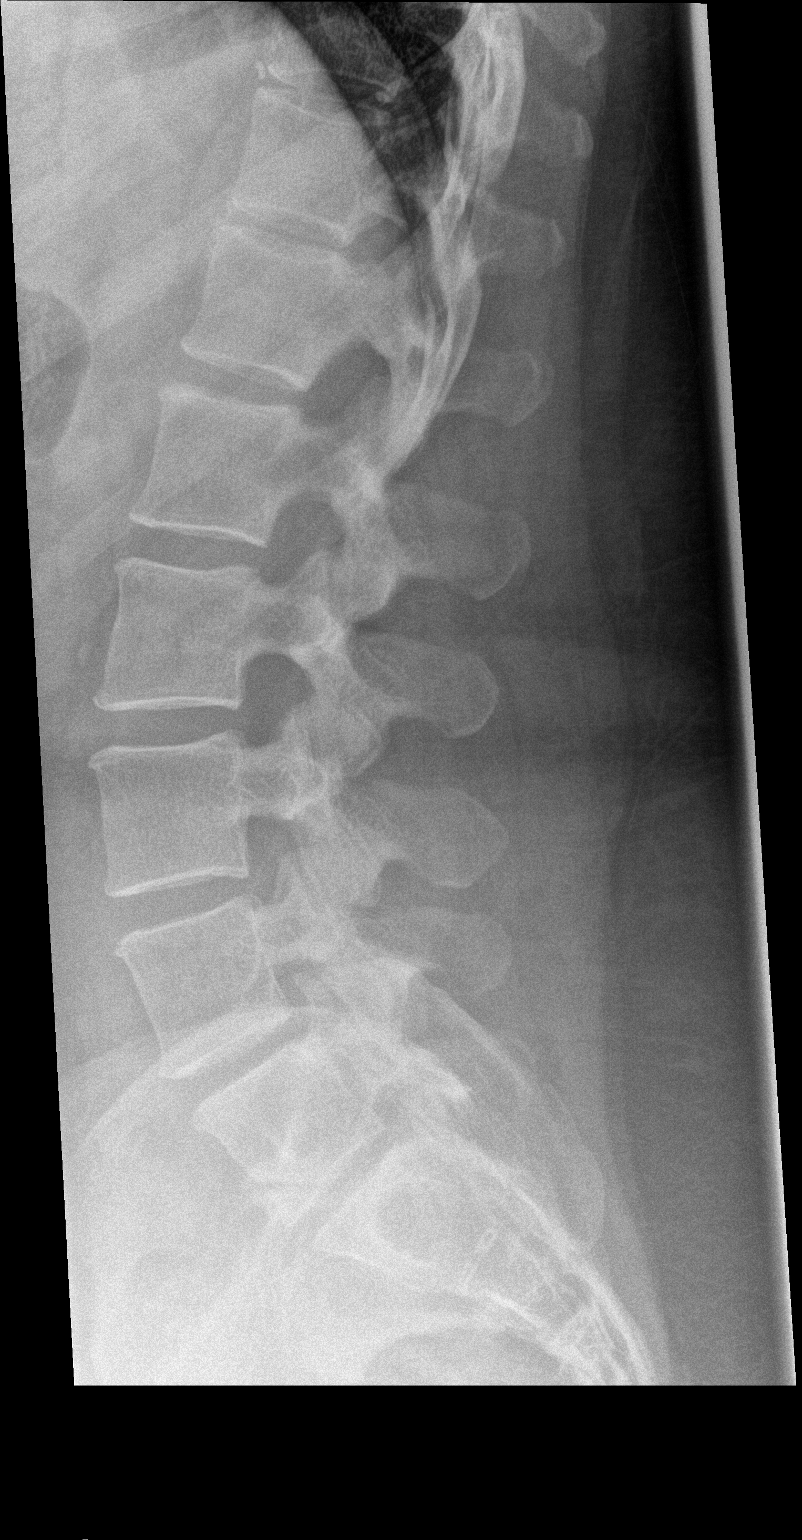
[im 3/3]
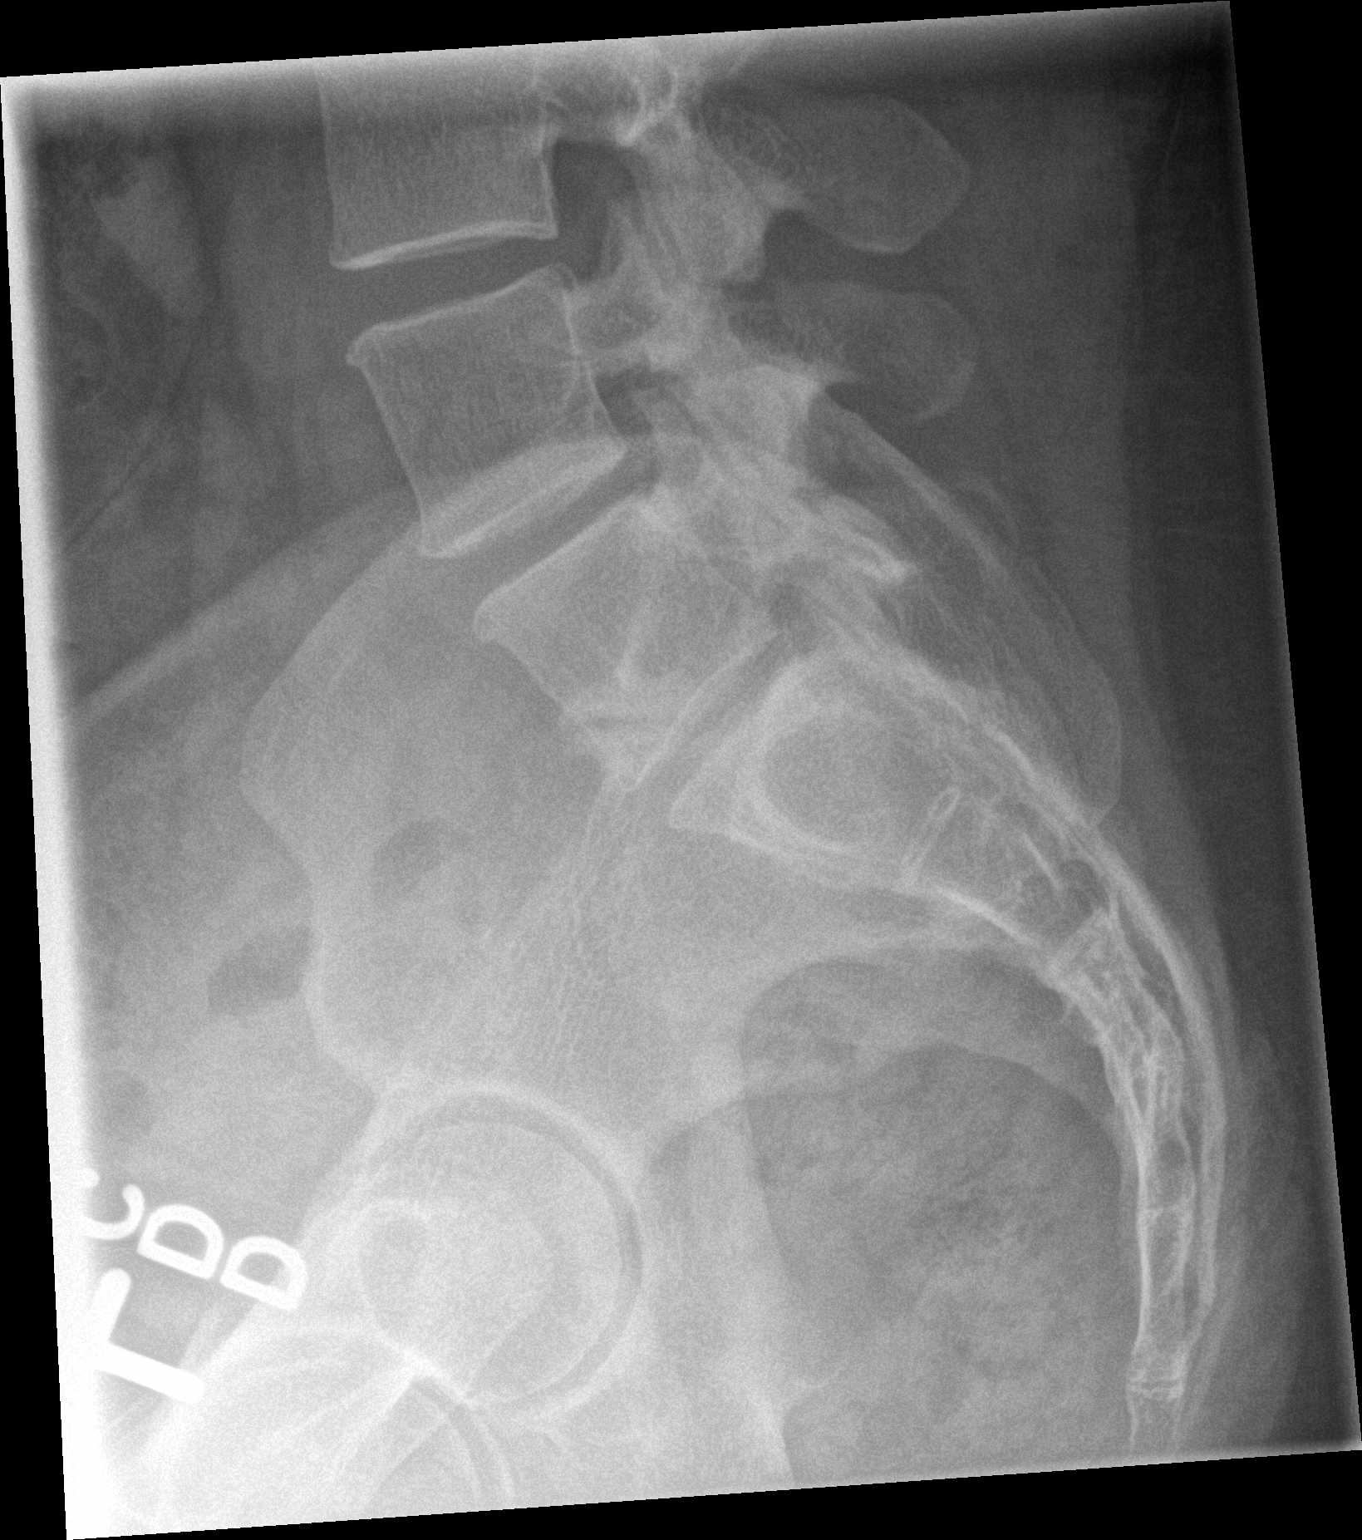

[3 of 3 positions shown; findings below may reference images not displayed]

FINDINGS: Vertebral body height and alignment are maintained. Very minimal
anterior marginal osteophytosis in the mid and lower lumbar spine.
Mild facet sclerosis in the lower lumbar spine.
IMPRESSION: 1. No acute findings.
2. Very minimal degenerative changes in the spine.

## 2017-06-18 ENCOUNTER — Ambulatory Visit: Payer: 59 | Admitting: Internal Medicine

## 2017-06-24 ENCOUNTER — Other Ambulatory Visit: Payer: Self-pay | Admitting: Internal Medicine

## 2017-06-27 ENCOUNTER — Other Ambulatory Visit: Payer: Self-pay | Admitting: Internal Medicine

## 2017-08-30 ENCOUNTER — Encounter: Payer: Self-pay | Admitting: Internal Medicine

## 2017-08-30 ENCOUNTER — Ambulatory Visit: Payer: Managed Care, Other (non HMO) | Admitting: Internal Medicine

## 2017-08-30 VITALS — BP 130/78 | HR 73 | Temp 98.7°F | Resp 16 | Wt 232.8 lb

## 2017-08-30 DIAGNOSIS — R319 Hematuria, unspecified: Secondary | ICD-10-CM | POA: Diagnosis not present

## 2017-08-30 DIAGNOSIS — Z1239 Encounter for other screening for malignant neoplasm of breast: Secondary | ICD-10-CM

## 2017-08-30 DIAGNOSIS — Z6841 Body Mass Index (BMI) 40.0 and over, adult: Secondary | ICD-10-CM | POA: Diagnosis not present

## 2017-08-30 DIAGNOSIS — R739 Hyperglycemia, unspecified: Secondary | ICD-10-CM | POA: Diagnosis not present

## 2017-08-30 DIAGNOSIS — Z1231 Encounter for screening mammogram for malignant neoplasm of breast: Secondary | ICD-10-CM | POA: Diagnosis not present

## 2017-08-30 DIAGNOSIS — C4491 Basal cell carcinoma of skin, unspecified: Secondary | ICD-10-CM | POA: Diagnosis not present

## 2017-08-30 DIAGNOSIS — E7800 Pure hypercholesterolemia, unspecified: Secondary | ICD-10-CM

## 2017-08-30 DIAGNOSIS — Z0000 Encounter for general adult medical examination without abnormal findings: Secondary | ICD-10-CM

## 2017-08-30 DIAGNOSIS — I10 Essential (primary) hypertension: Secondary | ICD-10-CM

## 2017-08-30 NOTE — Progress Notes (Signed)
[name], female   DOB: 1970-08-14, 47 y.o.   MRN: [id number]   Subjective:    [name], female    DOB: 1971-05-04, 47 y.o.   MRN: [id number]  HPI  Patient here for her physical exam.  Increased stress recently with her husband's medical issues.  Discussed with her today.  Overall she feels she is doing relatively well.  Does not feel needs anything more at this time.  Discussed diet and exercise.  States that over the last few days, she has adjusted her diet and plans to continue.  Plans to start exercising more.  No chest pain.  No sob.  No acid reflux.  No abdominal pain.  Bowels moving.  Since adjusting her diet and decreasing her sugar, her joints feel better.  Saw urology.  Kidney stone stable.  Planning for Mohs surgery for nasal lesion.     Past Medical History:  Diagnosis Date  . Abnormal pap    required freezing  . Hypercholesterolemia   . Hypertension   . Microscopic hematuria    s/p renal biopsy  . Thin basement membrane disease    Past Surgical History:  Procedure Laterality Date  . TONSILLECTOMY    . TUBAL LIGATION     Family History  Problem Relation Age of Onset  . Colon cancer Mother        died - age 32  . Hypertension Father   . Colon cancer Maternal Grandfather   . Breast cancer Maternal Aunt 60  . Breast cancer Paternal Aunt 38   Social History   Socioeconomic History  . Marital status: Married    Spouse name: None  . Number of children: 2  . Years of education: None  . Highest education level: None  Social Needs  . Financial resource strain: None  . Food insecurity - worry: None  . Food insecurity - inability: None  . Transportation needs - medical: None  . Transportation needs - non-medical: None  Occupational History    Employer: [employer]  Tobacco Use  . Smoking status: Never Smoker  . Smokeless tobacco: Never Used  Substance and Sexual Activity  . Alcohol use: No    Alcohol/week: 0.0 oz  . Drug use: No    . Sexual activity: Yes    Birth control/protection: Surgical, Pill  Other Topics Concern  . None  Social History Narrative  . None    Outpatient Encounter Medications as of 08/30/2017  Medication Sig  . lisinopril (PRINIVIL,ZESTRIL) 40 MG tablet TAKE 1 TABLET BY MOUTH  DAILY  . [DISCONTINUED] phenazopyridine (PYRIDIUM) 200 MG tablet Take 1 tablet (200 mg total) by mouth 3 (three) times daily as needed for pain.  . hydrochlorothiazide (MICROZIDE) 12.5 MG capsule TAKE 1 CAPSULE BY MOUTH  DAILY (Patient not taking: Reported on 08/30/2017)  . [DISCONTINUED] ciprofloxacin (CIPRO) 500 MG tablet Take 1 tablet (500 mg total) by mouth 2 (two) times daily. (Patient not taking: Reported on 08/30/2017)   No facility-administered encounter medications on file as of 08/30/2017.     Review of Systems  Constitutional: Negative for fatigue and unexpected weight change.  Respiratory: Negative for cough, chest tightness and shortness of breath.   Cardiovascular: Negative for chest pain and palpitations.       Objective:     Blood pressure rechecked by me:  128/84  Physical Exam  Constitutional: She is oriented to person, place, and time. She appears well-developed and well-nourished. No distress.  HENT:  Nose: Nose normal.  Mouth/Throat: Oropharynx is clear and moist.  Eyes: Right eye exhibits no discharge. Left eye exhibits no discharge. No scleral icterus.  Neck: Neck supple. No thyromegaly present.  Cardiovascular: Normal rate and regular rhythm.  Pulmonary/Chest: Breath sounds normal. No accessory muscle usage. No tachypnea. No respiratory distress. She has no decreased breath sounds. She has no wheezes. She has no rhonchi. Right breast exhibits no inverted nipple, no mass, no nipple discharge and no tenderness (no axillary adenopathy). Left breast exhibits no inverted nipple, no mass, no nipple discharge and no tenderness (no axilarry adenopathy).  Abdominal: Soft. Bowel sounds are normal. There is  no tenderness.  Musculoskeletal: She exhibits no edema or tenderness.  Lymphadenopathy:    She has no cervical adenopathy.  Neurological: She is alert and oriented to person, place, and time.  Skin: Skin is warm. No rash noted. No erythema.  Psychiatric: She has a normal mood and affect. Her behavior is normal.    BP 130/78 (BP Location: Left Arm, Patient Position: Sitting, Cuff Size: Large)   Pulse 73   Temp 98.7 F (37.1 C) (Oral)   Resp 16   Wt 232 lb 12.8 oz (105.6 kg)   SpO2 97%   BMI 42.58 kg/m  Wt Readings from Last 3 Encounters:  08/30/17 232 lb 12.8 oz (105.6 kg)  05/03/17 233 lb 12.8 oz (106.1 kg)  03/01/17 225 lb 12.8 oz (102.4 kg)     Lab Results  Component Value Date   WBC 10.2 02/28/2017   HGB 13.2 02/28/2017   HCT 40.0 02/28/2017   PLT 302 02/28/2017   GLUCOSE 100 (H) 02/28/2017   CHOL 192 02/28/2017   TRIG 177 (H) 02/28/2017   HDL 50 02/28/2017   LDLCALC 107 (H) 02/28/2017   ALT 19 02/28/2017   AST 19 02/28/2017   NA 140 02/28/2017   K 3.7 02/28/2017   CL 100 02/28/2017   CREATININE 0.91 02/28/2017   BUN 17 02/28/2017   CO2 23 02/28/2017   TSH 1.990 02/28/2017   HGBA1C 6.0 (H) 02/28/2017       Assessment & Plan:   Problem List Items Addressed This Visit    Basal cell carcinoma    Plan for Mohs surgery.        BMI 40.0-44.9, adult ([facility])    Discussed diet and exercise.  Follow.        Health care maintenance    Physical today 08/30/17.  PAP 8//3/18.  Mammogram 08/10/16 - Birads I.  Schedule f/u mammogram.       Hematuria    Has been worked up by nephrology.  Negative renal biopsy.  Felt to be thin basement membrane disease.  Follow renal function.        Relevant Orders   Urinalysis, Routine w reflex microscopic   Hypercholesterolemia    Low cholesterol diet and exercise.  Follow lipid panel.        Relevant Orders   Lipid panel   TSH   Hepatic function panel   Hyperglycemia    Low carb diet and exercise.  Follow met b and a1c.        Relevant Orders   Hemoglobin A1c   Hypertension    Blood pressure under good control.  Continue same medication regimen.  Follow pressures.  Follow metabolic panel.        Relevant Orders   CBC with Differential/Platelet   Ferritin   Basic metabolic panel   Sedimentation rate  Other Visit Diagnoses    Breast cancer screening    -  Primary   Relevant Orders   MM DIGITAL SCREENING BILATERAL       Einar Pheasant, MD

## 2017-09-01 ENCOUNTER — Encounter: Payer: Self-pay | Admitting: Internal Medicine

## 2017-09-01 DIAGNOSIS — C4491 Basal cell carcinoma of skin, unspecified: Secondary | ICD-10-CM | POA: Insufficient documentation

## 2017-09-01 NOTE — Assessment & Plan Note (Signed)
Discussed diet and exercise.  Follow.  

## 2017-09-01 NOTE — Assessment & Plan Note (Signed)
Low cholesterol diet and exercise.  Follow lipid panel.   

## 2017-09-01 NOTE — Assessment & Plan Note (Signed)
Blood pressure under good control.  Continue same medication regimen.  Follow pressures.  Follow metabolic panel.   

## 2017-09-01 NOTE — Assessment & Plan Note (Signed)
Has been worked up by nephrology.  Negative renal biopsy.  Felt to be thin basement membrane disease.  Follow renal function.   

## 2017-09-01 NOTE — Assessment & Plan Note (Signed)
Low carb diet and exercise.  Follow met b and a1c.  

## 2017-09-01 NOTE — Assessment & Plan Note (Signed)
Plan for Mohs surgery.

## 2017-09-01 NOTE — Assessment & Plan Note (Signed)
Physical today 08/30/17.  PAP 8//3/18.  Mammogram 08/10/16 - Birads I.  Schedule f/u mammogram.

## 2017-09-03 ENCOUNTER — Other Ambulatory Visit: Payer: Self-pay | Admitting: Internal Medicine

## 2017-09-14 ENCOUNTER — Emergency Department: Payer: Managed Care, Other (non HMO)

## 2017-09-14 ENCOUNTER — Emergency Department
Admission: EM | Admit: 2017-09-14 | Discharge: 2017-09-14 | Disposition: A | Discharge status: Home / Self Care | Payer: Managed Care, Other (non HMO) | Attending: Emergency Medicine | Admitting: Emergency Medicine

## 2017-09-14 ENCOUNTER — Other Ambulatory Visit: Payer: Self-pay

## 2017-09-14 DIAGNOSIS — I10 Essential (primary) hypertension: Secondary | ICD-10-CM | POA: Insufficient documentation

## 2017-09-14 DIAGNOSIS — R102 Pelvic and perineal pain: Secondary | ICD-10-CM | POA: Diagnosis not present

## 2017-09-14 DIAGNOSIS — Z79899 Other long term (current) drug therapy: Secondary | ICD-10-CM | POA: Insufficient documentation

## 2017-09-14 DIAGNOSIS — R1031 Right lower quadrant pain: Secondary | ICD-10-CM | POA: Insufficient documentation

## 2017-09-14 DIAGNOSIS — Z85828 Personal history of other malignant neoplasm of skin: Secondary | ICD-10-CM | POA: Insufficient documentation

## 2017-09-14 LAB — URINALYSIS, COMPLETE (UACMP) WITH MICROSCOPIC
BACTERIA UA: NONE SEEN
Bilirubin Urine: NEGATIVE
Glucose, UA: NEGATIVE mg/dL
Ketones, ur: NEGATIVE mg/dL
Leukocytes, UA: NEGATIVE
Nitrite: NEGATIVE
PH: 6 (ref 5.0–8.0)
Protein, ur: NEGATIVE mg/dL
SPECIFIC GRAVITY, URINE: 1.012 (ref 1.005–1.030)

## 2017-09-14 LAB — CBC
HEMATOCRIT: 42.5 % (ref 35.0–47.0)
Hemoglobin: 14.2 g/dL (ref 12.0–16.0)
MCH: 28.5 pg (ref 26.0–34.0)
MCHC: 33.3 g/dL (ref 32.0–36.0)
MCV: 85.5 fL (ref 80.0–100.0)
Platelets: 314 10*3/uL (ref 150–440)
RBC: 4.97 MIL/uL (ref 3.80–5.20)
RDW: 13.5 % (ref 11.5–14.5)
WBC: 10.2 10*3/uL (ref 3.6–11.0)

## 2017-09-14 LAB — COMPREHENSIVE METABOLIC PANEL
ALBUMIN: 4.2 g/dL (ref 3.5–5.0)
ALT: 16 U/L (ref 14–54)
AST: 18 U/L (ref 15–41)
Alkaline Phosphatase: 76 U/L (ref 38–126)
Anion gap: 9 (ref 5–15)
BILIRUBIN TOTAL: 0.4 mg/dL (ref 0.3–1.2)
BUN: 11 mg/dL (ref 6–20)
CHLORIDE: 104 mmol/L (ref 101–111)
CO2: 26 mmol/L (ref 22–32)
Calcium: 9.1 mg/dL (ref 8.9–10.3)
Creatinine, Ser: 0.83 mg/dL (ref 0.44–1.00)
GFR calc Af Amer: >60 mL/min (ref >60)
GFR calc non Af Amer: >60 mL/min (ref >60)
GLUCOSE: 105 mg/dL — AB (ref 65–99)
POTASSIUM: 3.5 mmol/L (ref 3.5–5.1)
Sodium: 139 mmol/L (ref 135–145)
TOTAL PROTEIN: 7.9 g/dL (ref 6.5–8.1)

## 2017-09-14 LAB — LIPASE, BLOOD: Lipase: 30 U/L (ref 11–51)

## 2017-09-14 LAB — POCT PREGNANCY, URINE: PREG TEST UR: NEGATIVE

## 2017-09-14 MED ORDER — MORPHINE SULFATE (PF) 2 MG/ML IV SOLN
INTRAVENOUS | Status: AC
  Filled 2017-09-14: qty 1

## 2017-09-14 MED ORDER — MORPHINE SULFATE (PF) 2 MG/ML IV SOLN
2.0000 mg | Freq: Once | INTRAVENOUS | Status: AC
  Administered 2017-09-14: 2 mg via INTRAVENOUS

## 2017-09-14 MED ORDER — ONDANSETRON HCL 4 MG/2ML IJ SOLN
INTRAMUSCULAR | Status: AC
  Filled 2017-09-14: qty 2

## 2017-09-14 MED ORDER — ONDANSETRON HCL 4 MG/2ML IJ SOLN
4.0000 mg | Freq: Once | INTRAMUSCULAR | Status: AC
  Administered 2017-09-14: 4 mg via INTRAVENOUS

## 2017-09-14 MED ORDER — IOPAMIDOL (ISOVUE-300) INJECTION 61%
100.0000 mL | Freq: Once | INTRAVENOUS | Status: AC | PRN
  Administered 2017-09-14: 100 mL via INTRAVENOUS
  Filled 2017-09-14: qty 100

## 2017-09-14 MED ORDER — IBUPROFEN 800 MG PO TABS: 800.0000 mg | ORAL_TABLET | Freq: Three times a day (TID) | ORAL | 0 refills | Status: DC | PRN

## 2017-09-14 NOTE — ED Provider Notes (Signed)
[facility] Emergency Department Provider Note    First MD Initiated Contact with Patient 09/14/17 1705     (approximate)  I have reviewed the triage vital signs and the nursing notes.   HISTORY  Chief Complaint Abdominal Pain   HPI Kathleen Espinoza is a 47 y.o. female with below list of chronic medical conditions presents to the emergency department with 3-day history of right lower quadrant abdominal pain which is persistently worsened since onset.  Patient denies any nausea vomiting diarrhea or constipation.  Patient denies any urinary symptoms.  Patient current pain score is 4 out of 10.  Past Medical History:  Diagnosis Date  . Abnormal pap    required freezing  . Hypercholesterolemia   . Hypertension   . Microscopic hematuria    s/p renal biopsy  . Thin basement membrane disease     Patient Active Problem List   Diagnosis Date Noted  . Basal cell carcinoma 09/01/2017  . Left tennis elbow 08/21/2015  . Facial lesion 04/09/2015  . Rib pain 04/09/2015  . Health care maintenance 04/09/2015  . SOB (shortness of breath) 01/24/2014  . BMI 40.0-44.9, adult ([place]) 01/24/2014  . Irregular menstrual cycle 09/20/2013  . Hyperglycemia 04/27/2013  . Hematuria 06/02/2012  . Hypercholesterolemia 06/02/2012  . Renal cyst 06/02/2012  . Hypertension 05/26/2012    Past Surgical History:  Procedure Laterality Date  . TONSILLECTOMY    . TUBAL LIGATION      Prior to Admission medications   Medication Sig Start Date End Date Taking? Authorizing Provider  lisinopril (PRINIVIL,ZESTRIL) 40 MG tablet TAKE 1 TABLET BY MOUTH  DAILY 06/25/17  Yes Einar Pheasant, MD  hydrochlorothiazide (MICROZIDE) 12.5 MG capsule TAKE 1 CAPSULE BY MOUTH  DAILY Patient not taking: Reported on 09/14/2017 09/03/17   Einar Pheasant, MD  ibuprofen (ADVIL,MOTRIN) 800 MG tablet Take 1 tablet (800 mg total) by mouth every 8 (eight) hours as needed. 09/14/17   Gregor Hams, MD     Allergies Betadine [povidone iodine]; Iodine; and Norvasc [amlodipine besylate]  Family History  Problem Relation Age of Onset  . Colon cancer Mother        died - age 60  . Hypertension Father   . Colon cancer Maternal Grandfather   . Breast cancer Maternal Aunt 60  . Breast cancer Paternal Aunt 38    Social History Social History   Tobacco Use  . Smoking status: Never Smoker  . Smokeless tobacco: Never Used  Substance Use Topics  . Alcohol use: No    Alcohol/week: 0.0 oz  . Drug use: No    Review of Systems Constitutional: No fever/chills Eyes: No visual changes. ENT: No sore throat. Cardiovascular: Denies chest pain. Respiratory: Denies shortness of breath. Gastrointestinal: Positive for abdominal pain.  No nausea, no vomiting.  No diarrhea.  No constipation. Genitourinary: Negative for dysuria. Musculoskeletal: Negative for neck pain.  Negative for back pain. Integumentary: Negative for rash. Neurological: Negative for headaches, focal weakness or numbness.   ____________________________________________   PHYSICAL EXAM:  VITAL SIGNS: ED Triage Vitals  Enc Vitals Group     BP 09/14/17 1319 (!) 117/54     Pulse Rate 09/14/17 1319 71     Resp 09/14/17 1319 18     Temp 09/14/17 1319 98.3 F (36.8 C)     Temp Source 09/14/17 1319 Oral     SpO2 09/14/17 1319 96 %     Weight 09/14/17 1319 104.3 kg (230 lb)  Height 09/14/17 1319 1.549 m (5\' 1" )     Head Circumference --      Peak Flow --      Pain Score 09/14/17 1323 4     Pain Loc --      Pain Edu? --      Excl. in [place]? --     Constitutional: Alert and oriented.  Apparent discomfort eyes: Conjunctivae are normal.  Head: Atraumatic. Nose: No congestion/rhinnorhea. Mouth/Throat: Mucous membranes are moist.  Oropharynx non-erythematous. Neck: No stridor. Cardiovascular: Normal rate, regular rhythm. Good peripheral circulation. Grossly normal heart sounds. Respiratory: Normal respiratory effort.   No retractions. Lungs CTAB. Gastrointestinal: Right lower quadrant tenderness to palpation. No distention.  Musculoskeletal: No lower extremity tenderness nor edema. No gross deformities of extremities. Neurologic:  Normal speech and language. No gross focal neurologic deficits are appreciated.  Skin:  Skin is warm, dry and intact. No rash noted. Psychiatric: Mood and affect are normal. Speech and behavior are normal.  ____________________________________________   LABS (all labs ordered are listed, but only abnormal results are displayed)  Labs Reviewed  COMPREHENSIVE METABOLIC PANEL - Abnormal; Notable for the following components:      Result Value   Glucose, Bld 105 (*)    All other components within normal limits  URINALYSIS, COMPLETE (UACMP) WITH MICROSCOPIC - Abnormal; Notable for the following components:   Color, Urine STRAW (*)    APPearance CLEAR (*)    Hgb urine dipstick SMALL (*)    Squamous Epithelial / LPF 0-5 (*)    All other components within normal limits  LIPASE, BLOOD  CBC  POC URINE PREG, ED  POCT PREGNANCY, URINE   ____________________________________________  RADIOLOGY I, [place] Mariea Mcmartin, personally viewed and evaluated these images (plain radiographs) as part of my medical decision making, as well as reviewing the written report by the radiologist.   Official radiology report(s): Ct Abdomen Pelvis W Contrast  Result Date: 09/14/2017 CLINICAL DATA:  Right lower quadrant pain x3 days EXAM: CT ABDOMEN AND PELVIS WITH CONTRAST TECHNIQUE: Multidetector CT imaging of the abdomen and pelvis was performed using the standard protocol following bolus administration of intravenous contrast. CONTRAST:  149mL ISOVUE-300 IOPAMIDOL (ISOVUE-300) INJECTION 61% COMPARISON:  Abdomen radiograph from 05/21/2017 FINDINGS: Lower chest: Normal size heart. Minimal atelectasis at the lung bases. Hepatobiliary: No focal liver abnormality is seen. No gallstones, gallbladder wall  thickening, or biliary dilatation. Pancreas: Normal Spleen: Normal Adrenals/Urinary Tract: Normal bilateral adrenal glands. Symmetric enhancement of both kidneys. Small cysts noted in the lower pole of the right kidney, the largest approximately 1 cm. No obstructive uropathy. Nonobstructing left lower pole renal calculus measuring up to 5 mm. Normal bladder. Stomach/Bowel: The stomach is nondistended. Normal small bowel rotation and ligament of Treitz position. Colon is unremarkable. The distal and terminal ileum insert along the posterior aspect of the cecum, series 2, image 62, simulating an appendix. No evidence of acute appendicitis. No abscess or perforation. Vascular/Lymphatic: No significant vascular findings are present. No enlarged abdominal or pelvic lymph nodes. Reproductive: A few nabothian cysts are noted at the cervix. The uterus and adnexa are otherwise within normal limits. Other: No free air nor free fluid. 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 No acute osseous abnormality. IMPRESSION: 1. Right lower pole renal cysts, the largest measuring approximately 10 mm. 2. 5 mm nonobstructing left lower pole renal calculus. 3. The appendix is not confidently identified however no pericecal inflammation is seen. 4. 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  Electronically Signed   By: Ashley Royalty M.D.   On: 09/14/2017 19:30   US Pelvic Complete With Transvaginal  Result Date: 09/14/2017 CLINICAL DATA:  Pelvic pain. EXAM: TRANSABDOMINAL AND TRANSVAGINAL ULTRASOUND OF PELVIS TECHNIQUE: Both transabdominal and transvaginal ultrasound examinations of the pelvis were performed. Transabdominal technique was performed for global imaging of the pelvis including uterus, ovaries, adnexal regions, and pelvic cul-de-sac. It was necessary to proceed with endovaginal exam following the transabdominal exam to visualize the right lower quadrant and pelvic pain. COMPARISON:  None FINDINGS: Uterus  Measurements: 11.5 x 4.1 x 4.7 cm. No fibroids or other mass visualized. Endometrium Thickness: 6 mm.  No focal abnormality visualized. Right ovary Measurements: 2.4 x 1.1 x 1.8 cm. Normal appearance/no adnexal mass. Left ovary Measurements: 2.1 x 1.5 x 2.1 cm. Normal appearance/no adnexal mass. Other findings Multiple large nabothian cysts are seen in the cervix. IMPRESSION: No cause for pelvic pain identified. Multiple large nabothian cysts in the cervix are of doubtful significance. Electronically Signed   By: Dorise Bullion III M.D   On: 09/14/2017 22:16    ______________ Procedures   ____________________________________________   INITIAL IMPRESSION / ASSESSMENT AND PLAN / ED COURSE  As part of my medical decision making, I reviewed the following data within the electronic MEDICAL RECORD [id number] year old female presented with above-stated history and physical exam secondary to right lower quadrant abdominal pain.  CT scan of the abdomen pelvis performed secondary concern for possible appendicitis versus ovarian cyst or kidney stone definitively identify the appendix on CT and the patient continues to have right lower quadrant pain with no apparent etiology etiology for her pain with appendicitis still be unlikely.  As such Dr. Hampton Abbot was consulted presented to the emergency department and evaluated the patient following reviewing CAT scan.  Dr. Hampton Abbot recommended a pelvic ultrasound which was performed and was negative.  He then reevaluated the patient and recommended discharge home with outpatient follow-up on Monday.  She was advised of warning signs that would warrant immediate return to the emergency department    ____________________________________________  FINAL CLINICAL IMPRESSION(S) / ED DIAGNOSES  Final diagnoses:  Pelvic pain  Right lower quadrant abdominal pain     MEDICATIONS GIVEN DURING THIS VISIT:  Medications  morphine 2 MG/ML injection 2 mg (2 mg Intravenous Given  09/14/17 1720)  ondansetron (ZOFRAN) injection 4 mg (4 mg Intravenous Given 09/14/17 1720)  iopamidol (ISOVUE-300) 61 % injection 100 mL (100 mLs Intravenous Contrast Given 09/14/17 1849)     ED Discharge Orders        Ordered    ibuprofen (ADVIL,MOTRIN) 800 MG tablet  Every 8 hours PRN     09/14/17 2219       Note:  This document was prepared using Dragon voice recognition software and may include unintentional dictation errors.    Gregor Hams, MD 09/14/17 2222

## 2017-09-14 NOTE — ED Triage Notes (Signed)
Pt presents to ED POV from doctors office for rule out appendicitis. Pt thought it was pulled muscle. Pt states pain x 3 days. Denies N&V&D& fever. States sometimes pain in back. Alert, oriented, no distress noted.

## 2017-09-14 NOTE — Consult Note (Signed)
Date of Consultation:  09/14/2017  Requesting Physician:  Marjean Donna, MD  Reason for Consultation:  Abdominal pain  History of Present Illness: Kathleen Espinoza is a 47 y.o. female who presents with a three day history of right lower quadrant abdominal pain.  The pain started on 2/14 while the patient was at work.  Denies any particular po intake or trauma to the area.  Her pain has continued since and has not improved, though has not worsened either.  It is very focal in location, and there is some radiation towards the mid abdomen and also right side.  Denies any fever or chills, chest pain or shortness of breath, nausea or vomiting, diarrhea or constipation, or dysuria.  Does report some pain in the right lower quadrant with urination, but no dysuria itself.    In the ED, she had normal labs and normal CT scan though the appendix could not be definitively visualized.  Pelvic ultrasound was negative as well.  Past Medical History: Past Medical History:  Diagnosis Date  . Abnormal pap    required freezing  . Hypercholesterolemia   . Hypertension   . Microscopic hematuria    s/p renal biopsy  . Thin basement membrane disease      Past Surgical History: Past Surgical History:  Procedure Laterality Date  . TONSILLECTOMY    . TUBAL LIGATION      Home Medications: Prior to Admission medications   Medication Sig Start Date End Date Taking? Authorizing Provider  lisinopril (PRINIVIL,ZESTRIL) 40 MG tablet TAKE 1 TABLET BY MOUTH  DAILY 06/25/17  Yes Einar Pheasant, MD  hydrochlorothiazide (MICROZIDE) 12.5 MG capsule TAKE 1 CAPSULE BY MOUTH  DAILY Patient not taking: Reported on 09/14/2017 09/03/17   Einar Pheasant, MD  ibuprofen (ADVIL,MOTRIN) 800 MG tablet Take 1 tablet (800 mg total) by mouth every 8 (eight) hours as needed. 09/14/17   Gregor Hams, MD    Allergies: Allergies  Allergen Reactions  . Betadine [Povidone Iodine] Swelling  . Iodine   . Norvasc [Amlodipine  Besylate] Swelling    Social History:  reports that  has never smoked. she has never used smokeless tobacco. She reports that she does not drink alcohol or use drugs.   Family History: Family History  Problem Relation Age of Onset  . Colon cancer Mother        died - age 23  . Hypertension Father   . Colon cancer Maternal Grandfather   . Breast cancer Maternal Aunt 60  . Breast cancer Paternal Aunt 50    Review of Systems: Review of Systems  Constitutional: Negative for chills and fever.  Respiratory: Negative for shortness of breath.   Cardiovascular: Negative for chest pain.  Gastrointestinal: Positive for abdominal pain. Negative for blood in stool, constipation, diarrhea, nausea and vomiting.  Genitourinary: Negative for dysuria.  Musculoskeletal: Negative for myalgias.  Skin: Negative for rash.  Neurological: Negative for dizziness.  Psychiatric/Behavioral: Negative for depression.  All other systems reviewed and are negative.   Physical Exam BP (!) 117/54 (BP Location: Left Arm)   Pulse 71   Temp 98.3 F (36.8 C) (Oral)   Resp 18   Ht 5\' 1"  (1.549 m)   Wt 104.3 kg (230 lb)   LMP 08/28/2017   SpO2 96%   BMI 43.46 kg/m  CONSTITUTIONAL: No acute distress HEENT:  Normocephalic, atraumatic, extraocular motion intact. NECK: Trachea is midline, and there is no jugular venous distension.  RESPIRATORY:  Lungs are clear, and breath  sounds are equal bilaterally. Normal respiratory effort without pathologic use of accessory muscles. CARDIOVASCULAR: Heart is regular without murmurs, gallops, or rubs. GI: The abdomen is soft, obese, nondistended, with tenderness to palpation in the right lower quadrant, very focal area. There were no palpable masses.  No peritoneal signs.  Negative Psoas sign, negative Rovsing or rebound. MUSCULOSKELETAL:  Normal muscle strength and tone in all four extremities.  No peripheral edema or cyanosis. SKIN: Skin turgor is normal. There are no  pathologic skin lesions.  NEUROLOGIC:  Motor and sensation is grossly normal.  Cranial nerves are grossly intact. PSYCH:  Alert and oriented to person, place and time. Affect is normal.  Laboratory Analysis: Results for orders placed or performed during the hospital encounter of 09/14/17 (from the past 24 hour(s))  Lipase, blood     Status: None   Collection Time: 09/14/17  1:26 PM  Result Value Ref Range   Lipase 30 11 - 51 U/L  Comprehensive metabolic panel     Status: Abnormal   Collection Time: 09/14/17  1:26 PM  Result Value Ref Range   Sodium 139 135 - 145 mmol/L   Potassium 3.5 3.5 - 5.1 mmol/L   Chloride 104 101 - 111 mmol/L   CO2 26 22 - 32 mmol/L   Glucose, Bld 105 (H) 65 - 99 mg/dL   BUN 11 6 - 20 mg/dL   Creatinine, Ser 0.83 0.44 - 1.00 mg/dL   Calcium 9.1 8.9 - 10.3 mg/dL   Total Protein 7.9 6.5 - 8.1 g/dL   Albumin 4.2 3.5 - 5.0 g/dL   AST 18 15 - 41 U/L   ALT 16 14 - 54 U/L   Alkaline Phosphatase 76 38 - 126 U/L   Total Bilirubin 0.4 0.3 - 1.2 mg/dL   GFR calc non Af Amer >60 >60 mL/min   GFR calc Af Amer >60 >60 mL/min   Anion gap 9 5 - 15  CBC     Status: None   Collection Time: 09/14/17  1:26 PM  Result Value Ref Range   WBC 10.2 3.6 - 11.0 K/uL   RBC 4.97 3.80 - 5.20 MIL/uL   Hemoglobin 14.2 12.0 - 16.0 g/dL   HCT 42.5 35.0 - 47.0 %   MCV 85.5 80.0 - 100.0 fL   MCH 28.5 26.0 - 34.0 pg   MCHC 33.3 32.0 - 36.0 g/dL   RDW 13.5 11.5 - 14.5 %   Platelets 314 150 - 440 K/uL  Urinalysis, Complete w Microscopic     Status: Abnormal   Collection Time: 09/14/17  1:26 PM  Result Value Ref Range   Color, Urine STRAW (A) YELLOW   APPearance CLEAR (A) CLEAR   Specific Gravity, Urine 1.012 1.005 - 1.030   pH 6.0 5.0 - 8.0   Glucose, UA NEGATIVE NEGATIVE mg/dL   Hgb urine dipstick SMALL (A) NEGATIVE   Bilirubin Urine NEGATIVE NEGATIVE   Ketones, ur NEGATIVE NEGATIVE mg/dL   Protein, ur NEGATIVE NEGATIVE mg/dL   Nitrite NEGATIVE NEGATIVE   Leukocytes, UA  NEGATIVE NEGATIVE   RBC / HPF 0-5 0 - 5 RBC/hpf   WBC, UA 0-5 0 - 5 WBC/hpf   Bacteria, UA NONE SEEN NONE SEEN   Squamous Epithelial / LPF 0-5 (A) NONE SEEN  Pregnancy, urine POC     Status: None   Collection Time: 09/14/17  1:35 PM  Result Value Ref Range   Preg Test, Ur NEGATIVE NEGATIVE    Imaging: Ct Abdomen Pelvis  W Contrast  Result Date: 09/14/2017 CLINICAL DATA:  Right lower quadrant pain x3 days EXAM: CT ABDOMEN AND PELVIS WITH CONTRAST TECHNIQUE: Multidetector CT imaging of the abdomen and pelvis was performed using the standard protocol following bolus administration of intravenous contrast. CONTRAST:  110mL ISOVUE-300 IOPAMIDOL (ISOVUE-300) INJECTION 61% COMPARISON:  Abdomen radiograph from 05/21/2017 FINDINGS: Lower chest: Normal size heart. Minimal atelectasis at the lung bases. Hepatobiliary: No focal liver abnormality is seen. No gallstones, gallbladder wall thickening, or biliary dilatation. Pancreas: Normal Spleen: Normal Adrenals/Urinary Tract: Normal bilateral adrenal glands. Symmetric enhancement of both kidneys. Small cysts noted in the lower pole of the right kidney, the largest approximately 1 cm. No obstructive uropathy. Nonobstructing left lower pole renal calculus measuring up to 5 mm. Normal bladder. Stomach/Bowel: The stomach is nondistended. Normal small bowel rotation and ligament of Treitz position. Colon is unremarkable. The distal and terminal ileum insert along the posterior aspect of the cecum, series 2, image 62, simulating an appendix. No evidence of acute appendicitis. No abscess or perforation. Vascular/Lymphatic: No significant vascular findings are present. No enlarged abdominal or pelvic lymph nodes. Reproductive: A few nabothian cysts are noted at the cervix. The uterus and adnexa are otherwise within normal limits. Other: No free air nor free fluid. 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 No acute osseous abnormality. IMPRESSION: 1. Right  lower pole renal cysts, the largest measuring approximately 10 mm. 2. 5 mm nonobstructing left lower pole renal calculus. 3. The appendix is not confidently identified however no pericecal inflammation is seen. 4. 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 Electronically Signed   By: Ashley Royalty M.D.   On: 09/14/2017 19:30   US Pelvic Complete With Transvaginal  Result Date: 09/14/2017 CLINICAL DATA:  Pelvic pain. EXAM: TRANSABDOMINAL AND TRANSVAGINAL ULTRASOUND OF PELVIS TECHNIQUE: Both transabdominal and transvaginal ultrasound examinations of the pelvis were performed. Transabdominal technique was performed for global imaging of the pelvis including uterus, ovaries, adnexal regions, and pelvic cul-de-sac. It was necessary to proceed with endovaginal exam following the transabdominal exam to visualize the right lower quadrant and pelvic pain. COMPARISON:  None FINDINGS: Uterus Measurements: 11.5 x 4.1 x 4.7 cm. No fibroids or other mass visualized. Endometrium Thickness: 6 mm.  No focal abnormality visualized. Right ovary Measurements: 2.4 x 1.1 x 1.8 cm. Normal appearance/no adnexal mass. Left ovary Measurements: 2.1 x 1.5 x 2.1 cm. Normal appearance/no adnexal mass. Other findings Multiple large nabothian cysts are seen in the cervix. IMPRESSION: No cause for pelvic pain identified. Multiple large nabothian cysts in the cervix are of doubtful significance. Electronically Signed   By: Dorise Bullion III M.D   On: 09/14/2017 22:16    Assessment and Plan: This is a 47 y.o. female who presents with a three day history of abdominal pain.  I have independently viewed the patient's imaging studies and reviewed the patient's laboratory studies. Overall on her CT scan, I cannot find the appendix and it's not clearly visualized on prior CT scan from 2009.  The patient denies any prior appendectomy.  There is no inflammation, stranding, fluid, or other pathology in the right lower quadrant either.  Her U/S does not  show any ovarian mass or cyst or torsion.  Discussed with the patient that currently there is clear diagnosis or explanation for her right lower quadrant pain.  There are no changes on CT that would be expected after three days if this were appendicitis.  Her labs are normal.  Discussed with patient that  we could offer admission for observation and keep her NPO and repeat labs in AM.  I would not start antibiotics as there is no source to treat at this point.  The patient opted for discharge to home, which is reasonable in the absence of any diagnosis.  Did discuss with patient return precautions and that if her pain worsens, develops fevers or chills, nausea, or vomiting, to come back to hospital right away.  For now, as her pain has not worsened or changed, she can try Ibuprofen 600 mg TID as needed for pain.  She may follow up in our office on an as needed basis as outpatient.  Patient understands this plan and all of her questions have been answered.   Melvyn Neth, [place]

## 2017-09-14 NOTE — ED Notes (Signed)
CT called asking about pt allergy to iodine. Pt states that it's a skin reaction if she is scrubbed with betadine or iodine. She is NOT allergic to CT contrast.

## 2017-09-15 ENCOUNTER — Other Ambulatory Visit: Payer: Self-pay | Admitting: Internal Medicine

## 2017-09-16 ENCOUNTER — Telehealth: Payer: Self-pay

## 2017-09-16 NOTE — Telephone Encounter (Signed)
Contacted patient and left her a message to call us back.  If patient calls back, please schedule her an appointment with any surgeon if her abdominal pain persists. Please let one of the CMA's know that she scheduled an appointment so we could order labs prior to seeing her. Thank you.

## 2017-09-16 NOTE — Telephone Encounter (Signed)
-----   Message from Olean Ree, MD sent at 09/14/2017 10:54 PM EST ----- Regarding: ED follow up Hi,  I saw this patient in the ED on 2/16 for right lower quadrant abdominal pain (pain was her only symptom).  Despite labs, CT, and ultrasound, we couldn't figure out the reason for her pain and the patient opted for home discharge.  I did tell her to contact us Monday if her pain had not improved, or any worsening, fevers, chills, nausea, vomiting, etc.  She might need repeat labs and appointment if there is any worsening.  Thanks,  [employer]

## 2017-09-17 ENCOUNTER — Ambulatory Visit
Admission: RE | Admit: 2017-09-17 | Discharge: 2017-09-17 | Disposition: A | Discharge status: Home / Self Care | Payer: Managed Care, Other (non HMO) | Source: Ambulatory Visit | Attending: Internal Medicine | Admitting: Internal Medicine

## 2017-09-17 DIAGNOSIS — Z1231 Encounter for screening mammogram for malignant neoplasm of breast: Secondary | ICD-10-CM | POA: Insufficient documentation

## 2017-09-17 DIAGNOSIS — Z1239 Encounter for other screening for malignant neoplasm of breast: Secondary | ICD-10-CM

## 2017-09-18 ENCOUNTER — Encounter: Payer: Self-pay | Admitting: Internal Medicine

## 2017-11-26 ENCOUNTER — Other Ambulatory Visit: Payer: Self-pay | Admitting: Internal Medicine

## 2017-11-29 ENCOUNTER — Encounter: Payer: Self-pay | Admitting: Internal Medicine

## 2017-12-02 NOTE — Telephone Encounter (Signed)
Not sure what happened here.  I ok'd lyrica rx.  Please confirm they received the medication.  Thanks

## 2017-12-30 ENCOUNTER — Encounter: Payer: Self-pay | Admitting: Internal Medicine

## 2017-12-31 NOTE — Telephone Encounter (Signed)
Left message for patient to call back and get more information

## 2017-12-31 NOTE — Telephone Encounter (Signed)
Patient scheduled for Thursday at 12.

## 2017-12-31 NOTE — Telephone Encounter (Signed)
I can see her at 12:00 on Thursday 01/02/18 - if ok to wait until then.  Let me know if this is a problem.

## 2018-01-02 ENCOUNTER — Ambulatory Visit: Payer: Managed Care, Other (non HMO) | Admitting: Internal Medicine

## 2018-01-02 DIAGNOSIS — R739 Hyperglycemia, unspecified: Secondary | ICD-10-CM

## 2018-01-02 DIAGNOSIS — I10 Essential (primary) hypertension: Secondary | ICD-10-CM | POA: Diagnosis not present

## 2018-01-02 DIAGNOSIS — F439 Reaction to severe stress, unspecified: Secondary | ICD-10-CM | POA: Diagnosis not present

## 2018-01-02 MED ORDER — SERTRALINE HCL 50 MG PO TABS: ORAL_TABLET | ORAL | 1 refills | Status: DC

## 2018-01-02 NOTE — Progress Notes (Signed)
[name], female   DOB: May 23, 1971, 47 y.o.   MRN: [id number]   Subjective:    [name], female    DOB: 1971/07/16, 47 y.o.   MRN: [id number]  HPI  Patient here as a work in with concerns regarding increased stress.  Increased stress with her job and her husband's health issues.  Husband recently had second stroke.  She is also having to do a lot more around the house.  Just feels overwhelmed.  No depression.  Feels needs something to help level things out.  Physically she feels she is doing ok.  Breathing stable.  Eating.  No vomiting or bowel change.     Past Medical History:  Diagnosis Date  . Abnormal pap    required freezing  . Hypercholesterolemia   . Hypertension   . Microscopic hematuria    s/p renal biopsy  . Thin basement membrane disease    Past Surgical History:  Procedure Laterality Date  . TONSILLECTOMY    . TUBAL LIGATION     Family History  Problem Relation Age of Onset  . Colon cancer Mother        died - age 2  . Hypertension Father   . Colon cancer Maternal Grandfather   . Breast cancer Maternal Aunt 60  . Breast cancer Paternal Aunt 62   Social History   Socioeconomic History  . Marital status: Married    Spouse name: Not on file  . Number of children: 2  . Years of education: Not on file  . Highest education level: Not on file  Occupational History    Employer: [employer]  . Financial resource strain: Not on file  . Food insecurity:    Worry: Not on file    Inability: Not on file  . Transportation needs:    Medical: Not on file    Non-medical: Not on file  Tobacco Use  . Smoking status: Never Smoker  . Smokeless tobacco: Never Used  Substance and Sexual Activity  . Alcohol use: No    Alcohol/week: 0.0 oz  . Drug use: No  . Sexual activity: Yes    Birth control/protection: Surgical, Pill  Lifestyle  . Physical activity:    Days per week: Not on file    Minutes per session: Not on file  .  Stress: Not on file  Relationships  . Social connections:    Talks on phone: Not on file    Gets together: Not on file    Attends religious service: Not on file    Active member of club or organization: Not on file    Attends meetings of clubs or organizations: Not on file    Relationship status: Not on file  Other Topics Concern  . Not on file  Social History Narrative  . Not on file    Outpatient Encounter Medications as of 01/02/2018  Medication Sig  . hydrochlorothiazide (MICROZIDE) 12.5 MG capsule TAKE 1 CAPSULE BY MOUTH  DAILY (Patient not taking: Reported on 09/14/2017)  . ibuprofen (ADVIL,MOTRIN) 800 MG tablet Take 1 tablet (800 mg total) by mouth every 8 (eight) hours as needed.  Marland Kitchen lisinopril (PRINIVIL,ZESTRIL) 40 MG tablet TAKE 1 TABLET BY MOUTH  DAILY  . sertraline (ZOLOFT) 50 MG tablet Take 1/2 tablet x 1 week and then q day   No facility-administered encounter medications on file as of 01/02/2018.     Review of Systems  Constitutional: Negative for  appetite change and unexpected weight change.  HENT: Negative for congestion and sinus pressure.   Respiratory: Negative for cough, chest tightness and shortness of breath.   Cardiovascular: Negative for chest pain, palpitations and leg swelling.  Gastrointestinal: Negative for abdominal pain, diarrhea, nausea and vomiting.  Musculoskeletal: Negative for joint swelling and myalgias.  Skin: Negative for color change and rash.  Neurological: Negative for dizziness, light-headedness and headaches.  Psychiatric/Behavioral: Negative for dysphoric mood.       Increased stress as outlined.         Objective:    Physical Exam  Constitutional: She appears well-developed and well-nourished. No distress.  HENT:  Nose: Nose normal.  Mouth/Throat: Oropharynx is clear and moist.  Neck: Neck supple. No thyromegaly present.  Cardiovascular: Normal rate and regular rhythm.  Pulmonary/Chest: Breath sounds normal. No respiratory  distress. She has no wheezes.  Abdominal: Soft. Bowel sounds are normal. There is no tenderness.  Musculoskeletal: She exhibits no edema or tenderness.  Lymphadenopathy:    She has no cervical adenopathy.  Skin: No rash noted. No erythema.  Psychiatric: She has a normal mood and affect. Her behavior is normal.    BP 138/90 (BP Location: Left Arm, Patient Position: Sitting, Cuff Size: Large)   Pulse 71   Temp 98.3 F (36.8 C) (Oral)   Resp 18   Wt 236 lb (107 kg)   SpO2 98%   BMI 44.59 kg/m  Wt Readings from Last 3 Encounters:  01/02/18 236 lb (107 kg)  09/14/17 230 lb (104.3 kg)  08/30/17 232 lb 12.8 oz (105.6 kg)     Lab Results  Component Value Date   WBC 10.2 09/14/2017   HGB 14.2 09/14/2017   HCT 42.5 09/14/2017   PLT 314 09/14/2017   GLUCOSE 105 (H) 09/14/2017   CHOL 192 02/28/2017   TRIG 177 (H) 02/28/2017   HDL 50 02/28/2017   LDLCALC 107 (H) 02/28/2017   ALT 16 09/14/2017   AST 18 09/14/2017   NA 139 09/14/2017   K 3.5 09/14/2017   CL 104 09/14/2017   CREATININE 0.83 09/14/2017   BUN 11 09/14/2017   CO2 26 09/14/2017   TSH 1.990 02/28/2017   HGBA1C 6.0 (H) 02/28/2017    Mm Digital Screening Bilateral  Result Date: 09/17/2017 CLINICAL DATA:  Screening. EXAM: DIGITAL SCREENING BILATERAL MAMMOGRAM WITH CAD COMPARISON:  Previous exam(s). ACR Breast Density Category b: There are scattered areas of fibroglandular density. FINDINGS: There are no findings suspicious for malignancy. Images were processed with CAD. IMPRESSION: No mammographic evidence of malignancy. A result letter of this screening mammogram will be mailed directly to the patient. RECOMMENDATION: Screening mammogram in one year. (Code:SM-B-01Y) BI-RADS CATEGORY  1: Negative. Electronically Signed   By: Everlean Alstrom M.D.   On: 09/17/2017 10:05       Assessment & Plan:   Problem List Items Addressed This Visit    Hyperglycemia    Have discussed low carb diet and exercise.  Follow met b and  a1c.        Hypertension    Blood pressure slightly elevated today.  Feel related to the increased stress.  Start zoloft as outlined.  Follow pressures.  If persistent elevation will adjust medication.  Has been under reasonable control.       Stress    Increased stress as outlined.  Discussed with her today.  Will start zoloft as directed.  Follow closely.  Get her back in soon to reassess.  Einar Pheasant, MD

## 2018-01-05 ENCOUNTER — Encounter: Payer: Self-pay | Admitting: Internal Medicine

## 2018-01-05 DIAGNOSIS — F439 Reaction to severe stress, unspecified: Secondary | ICD-10-CM | POA: Insufficient documentation

## 2018-01-05 NOTE — Assessment & Plan Note (Signed)
Increased stress as outlined.  Discussed with her today.  Will start zoloft as directed.  Follow closely.  Get her back in soon to reassess.

## 2018-01-05 NOTE — Assessment & Plan Note (Signed)
Have discussed low carb diet and exercise.  Follow met b and a1c.

## 2018-01-05 NOTE — Assessment & Plan Note (Signed)
Blood pressure slightly elevated today.  Feel related to the increased stress.  Start zoloft as outlined.  Follow pressures.  If persistent elevation will adjust medication.  Has been under reasonable control.

## 2018-01-14 ENCOUNTER — Encounter: Payer: Self-pay | Admitting: Internal Medicine

## 2018-01-17 ENCOUNTER — Telehealth: Payer: Self-pay | Admitting: *Deleted

## 2018-01-17 NOTE — Telephone Encounter (Signed)
Please call and let pt know that I have not been in the office this week.  Please confirm no other acute issues.  Also confirm she is currently taking lisinopril 40mg  q day and hctz 12.5mg  q day.  If this is correct, can increase hctz to 25mg  q day.  Have her monitor pressures.  Keep f/u appt.  We will need to follow kidney function and potassium.  Let me know if any problems.

## 2018-01-17 NOTE — Telephone Encounter (Signed)
Tried to reach patient by phone no answer have sent my chart message .

## 2018-02-06 ENCOUNTER — Other Ambulatory Visit: Payer: Self-pay | Admitting: Internal Medicine

## 2018-02-18 ENCOUNTER — Ambulatory Visit: Payer: Managed Care, Other (non HMO) | Admitting: Internal Medicine

## 2018-03-12 ENCOUNTER — Other Ambulatory Visit: Payer: Self-pay | Admitting: Internal Medicine

## 2018-04-03 ENCOUNTER — Other Ambulatory Visit: Payer: Self-pay | Admitting: Internal Medicine

## 2018-04-07 ENCOUNTER — Encounter: Payer: Self-pay | Admitting: Internal Medicine

## 2018-04-17 NOTE — Telephone Encounter (Signed)
Lm for pt to call back and schedule.

## 2018-04-29 ENCOUNTER — Telehealth: Payer: Self-pay

## 2018-04-29 NOTE — Telephone Encounter (Signed)
Copied from [place] [id number]. Topic: Appointment Scheduling - Scheduling Inquiry for Clinic >> Apr 29, 2018  9:19 AM Lennox Solders wrote: Reason for CRM:pt needs to come in before 05-18-18 to discuss weight loss plan and to appeal wellness exam from her job.

## 2018-04-29 NOTE — Telephone Encounter (Signed)
Left message for patient to call back and schedule. Sending to [place] as well in case I am not here to schedule

## 2018-05-08 ENCOUNTER — Encounter: Payer: Self-pay | Admitting: Internal Medicine

## 2018-05-08 ENCOUNTER — Ambulatory Visit (INDEPENDENT_AMBULATORY_CARE_PROVIDER_SITE_OTHER): Payer: Managed Care, Other (non HMO) | Admitting: Internal Medicine

## 2018-05-08 VITALS — BP 120/82 | HR 77 | Temp 98.1°F | Resp 15 | Ht 64.0 in | Wt 234.8 lb

## 2018-05-08 DIAGNOSIS — R739 Hyperglycemia, unspecified: Secondary | ICD-10-CM

## 2018-05-08 DIAGNOSIS — E7800 Pure hypercholesterolemia, unspecified: Secondary | ICD-10-CM

## 2018-05-08 DIAGNOSIS — F439 Reaction to severe stress, unspecified: Secondary | ICD-10-CM

## 2018-05-08 DIAGNOSIS — I10 Essential (primary) hypertension: Secondary | ICD-10-CM | POA: Diagnosis not present

## 2018-05-08 DIAGNOSIS — R319 Hematuria, unspecified: Secondary | ICD-10-CM

## 2018-05-08 DIAGNOSIS — Z0289 Encounter for other administrative examinations: Secondary | ICD-10-CM

## 2018-05-08 NOTE — Progress Notes (Signed)
[name], female   DOB: 1971/06/07, 47 y.o.   MRN: [id number]   Subjective:    [name], female    DOB: July 03, 1971, 47 y.o.   MRN: [id number]  HPI  Patient here for as a work in to have health screening form completed.  She reports she is doing relatively well.  Not watching her diet.  No exercising.  Discussed diet and exercise today.  She does plan to get back to being more serious about her diet.  Also plans to start exercising - walking.  No chest pain.  Breathing stable.  No acid reflux.  No abdominal pain.  Bowels moving.  Increased stress with her husband's health issues.  Overall she feels she is handling things relatively well and does not feel needs any further intervention.  Does not feel needs zoloft.  Not taking.     Past Medical History:  Diagnosis Date  . Abnormal pap    required freezing  . Hypercholesterolemia   . Hypertension   . Microscopic hematuria    s/p renal biopsy  . Thin basement membrane disease    Past Surgical History:  Procedure Laterality Date  . TONSILLECTOMY    . TUBAL LIGATION     Family History  Problem Relation Age of Onset  . Colon cancer Mother        died - age 64  . Hypertension Father   . Colon cancer Maternal Grandfather   . Breast cancer Maternal Aunt 60  . Breast cancer Paternal Aunt 57   Social History   Socioeconomic History  . Marital status: Married    Spouse name: Not on file  . Number of children: 2  . Years of education: Not on file  . Highest education level: Not on file  Occupational History    Employer: [employer]  . Financial resource strain: Not on file  . Food insecurity:    Worry: Not on file    Inability: Not on file  . Transportation needs:    Medical: Not on file    Non-medical: Not on file  Tobacco Use  . Smoking status: Never Smoker  . Smokeless tobacco: Never Used  Substance and Sexual Activity  . Alcohol use: No    Alcohol/week: 0.0 standard drinks  .  Drug use: No  . Sexual activity: Yes    Birth control/protection: Surgical, Pill  Lifestyle  . Physical activity:    Days per week: Not on file    Minutes per session: Not on file  . Stress: Not on file  Relationships  . Social connections:    Talks on phone: Not on file    Gets together: Not on file    Attends religious service: Not on file    Active member of club or organization: Not on file    Attends meetings of clubs or organizations: Not on file    Relationship status: Not on file  Other Topics Concern  . Not on file  Social History Narrative  . Not on file    Outpatient Encounter Medications as of 05/08/2018  Medication Sig  . hydrochlorothiazide (MICROZIDE) 12.5 MG capsule TAKE 1 CAPSULE BY MOUTH  DAILY  . ibuprofen (ADVIL,MOTRIN) 800 MG tablet Take 1 tablet (800 mg total) by mouth every 8 (eight) hours as needed.  Marland Kitchen lisinopril (PRINIVIL,ZESTRIL) 40 MG tablet TAKE 1 TABLET BY MOUTH  DAILY  . [DISCONTINUED] sertraline (ZOLOFT) 50 MG tablet Take 1/2  tablet x 1 week and then q day (Patient not taking: Reported on 05/08/2018)   No facility-administered encounter medications on file as of 05/08/2018.     Review of Systems  Constitutional: Negative for appetite change and unexpected weight change.  HENT: Negative for congestion and sinus pressure.   Eyes: Negative for pain and visual disturbance.  Respiratory: Negative for cough, chest tightness and shortness of breath.   Cardiovascular: Negative for chest pain, palpitations and leg swelling.  Gastrointestinal: Negative for abdominal pain, diarrhea, nausea and vomiting.  Genitourinary: Negative for difficulty urinating and dysuria.  Musculoskeletal: Negative for joint swelling and myalgias.  Skin: Negative for color change and rash.  Neurological: Negative for dizziness, light-headedness and headaches.  Hematological: Negative for adenopathy. Does not bruise/bleed easily.  Psychiatric/Behavioral: Negative for agitation  and dysphoric mood.       Increased stress as outlined.         Objective:     Blood pressure rechecked by me:  122/80  Physical Exam  Constitutional: She appears well-developed and well-nourished. No distress.  HENT:  Nose: Nose normal.  Mouth/Throat: Oropharynx is clear and moist.  Neck: Neck supple. No thyromegaly present.  Cardiovascular: Normal rate and regular rhythm.  Pulmonary/Chest: Breath sounds normal. No respiratory distress. She has no wheezes.  Abdominal: Soft. Bowel sounds are normal. There is no tenderness.  Musculoskeletal: She exhibits no edema or tenderness.  Lymphadenopathy:    She has no cervical adenopathy.  Skin: No rash noted. No erythema.  Psychiatric: She has a normal mood and affect. Her behavior is normal.    BP 120/82 (BP Location: Left Arm, Patient Position: Sitting, Cuff Size: Large)   Pulse 77   Temp 98.1 F (36.7 C) (Oral)   Resp 15   Ht 5' 4"  (1.626 m)   Wt 234 lb 12 oz (106.5 kg)   SpO2 97%   BMI 40.29 kg/m  Wt Readings from Last 3 Encounters:  05/08/18 234 lb 12 oz (106.5 kg)  01/02/18 236 lb (107 kg)  09/14/17 230 lb (104.3 kg)     Lab Results  Component Value Date   WBC 10.2 09/14/2017   HGB 14.2 09/14/2017   HCT 42.5 09/14/2017   PLT 314 09/14/2017   GLUCOSE 105 (H) 09/14/2017   CHOL 192 02/28/2017   TRIG 177 (H) 02/28/2017   HDL 50 02/28/2017   LDLCALC 107 (H) 02/28/2017   ALT 16 09/14/2017   AST 18 09/14/2017   NA 139 09/14/2017   K 3.5 09/14/2017   CL 104 09/14/2017   CREATININE 0.83 09/14/2017   BUN 11 09/14/2017   CO2 26 09/14/2017   TSH 1.990 02/28/2017   HGBA1C 6.0 (H) 02/28/2017    Mm Digital Screening Bilateral  Result Date: 09/17/2017 CLINICAL DATA:  Screening. EXAM: DIGITAL SCREENING BILATERAL MAMMOGRAM WITH CAD COMPARISON:  Previous exam(s). ACR Breast Density Category b: There are scattered areas of fibroglandular density. FINDINGS: There are no findings suspicious for malignancy. Images were  processed with CAD. IMPRESSION: No mammographic evidence of malignancy. A result letter of this screening mammogram will be mailed directly to the patient. RECOMMENDATION: Screening mammogram in one year. (Code:SM-B-01Y) BI-RADS CATEGORY  1: Negative. Electronically Signed   By: Everlean Alstrom M.D.   On: 09/17/2017 10:05       Assessment & Plan:   Problem List Items Addressed This Visit    Hematuria    Has been worked up by nephrology.  Negative renal biopsy.  Felt to be thin  basement membrane disease.  Follow renal function.        Relevant Orders   Urinalysis, Routine w reflex microscopic   Hypercholesterolemia    Low cholesterol diet and exercise.  Follow lipid panel.        Relevant Orders   Hepatic function panel   Lipid panel   Hyperglycemia    Low carb diet and exercise.  Follow met b and a1c.        Relevant Orders   Hemoglobin A1c   Hypertension    Blood pressure under good control.  Continue same medication regimen.  Follow pressures.  Follow metabolic panel.        Relevant Orders   CBC with Differential/Platelet   TSH   Basic metabolic panel   Stress    Increased stress as outlined.  Off zoloft.  Only took for a short time.  Does not feel needs any further intervention.  Follow.         Other Visit Diagnoses    Encounter for completion of form with patient    -  Primary   Health form completed.  Discussed diet and exercise.         Einar Pheasant, MD

## 2018-05-11 ENCOUNTER — Encounter: Payer: Self-pay | Admitting: Internal Medicine

## 2018-05-11 NOTE — Assessment & Plan Note (Signed)
Low carb diet and exercise.  Follow met b and a1c.   

## 2018-05-11 NOTE — Assessment & Plan Note (Signed)
Increased stress as outlined.  Off zoloft.  Only took for a short time.  Does not feel needs any further intervention.  Follow.

## 2018-05-11 NOTE — Assessment & Plan Note (Signed)
Low cholesterol diet and exercise.  Follow lipid panel.   

## 2018-05-11 NOTE — Assessment & Plan Note (Signed)
Has been worked up by nephrology.  Negative renal biopsy.  Felt to be thin basement membrane disease.  Follow renal function.

## 2018-05-11 NOTE — Assessment & Plan Note (Signed)
Blood pressure under good control.  Continue same medication regimen.  Follow pressures.  Follow metabolic panel.   

## 2018-05-25 ENCOUNTER — Other Ambulatory Visit: Payer: Self-pay | Admitting: Internal Medicine

## 2018-05-30 ENCOUNTER — Encounter: Payer: Self-pay | Admitting: Internal Medicine

## 2018-05-30 NOTE — Telephone Encounter (Signed)
Would recommend to [facility] for immunization.

## 2018-06-13 NOTE — Telephone Encounter (Signed)
See previous note

## 2018-06-27 ENCOUNTER — Other Ambulatory Visit: Payer: Self-pay | Admitting: Internal Medicine

## 2018-08-18 ENCOUNTER — Other Ambulatory Visit: Payer: Self-pay | Admitting: Internal Medicine

## 2018-08-25 ENCOUNTER — Encounter: Payer: Self-pay | Admitting: Internal Medicine

## 2018-08-25 NOTE — Telephone Encounter (Signed)
Patient scheduled for 9:00 tomorrow morning. Patient confirmed no other symptoms, just sinus pressure.

## 2018-08-25 NOTE — Telephone Encounter (Signed)
LMTCB

## 2018-08-26 ENCOUNTER — Ambulatory Visit: Payer: Managed Care, Other (non HMO) | Admitting: Internal Medicine

## 2018-08-26 ENCOUNTER — Encounter: Payer: Self-pay | Admitting: Internal Medicine

## 2018-08-26 VITALS — BP 118/76 | HR 76 | Temp 97.9°F | Resp 16 | Wt 236.2 lb

## 2018-08-26 DIAGNOSIS — R0981 Nasal congestion: Secondary | ICD-10-CM

## 2018-08-26 DIAGNOSIS — I10 Essential (primary) hypertension: Secondary | ICD-10-CM | POA: Diagnosis not present

## 2018-08-26 DIAGNOSIS — F439 Reaction to severe stress, unspecified: Secondary | ICD-10-CM

## 2018-08-26 NOTE — Progress Notes (Signed)
[name], female   DOB: 1971-03-19, 48 y.o.   MRN: [id number]   Subjective:    [name], female    DOB: Nov 22, 1970, 48 y.o.   MRN: [id number]  HPI  Patient here as a work in with concerns regarding increased sinus pressure.   States she had a "cold" three weeks ago.  Symptoms improved.  Over the last few days, symptoms worsened.  Increased sinus pressure.  Better today.  Some nasal congestion.  No ear pain.  No sore throat.  Some cough.  Previous wheezing.  No acid reflux.  Taking nyquil/dayquil.  No nausea or vomiting.  No diarrhea.     Past Medical History:  Diagnosis Date  . Abnormal pap    required freezing  . Hypercholesterolemia   . Hypertension   . Microscopic hematuria    s/p renal biopsy  . Thin basement membrane disease    Past Surgical History:  Procedure Laterality Date  . TONSILLECTOMY    . TUBAL LIGATION     Family History  Problem Relation Age of Onset  . Colon cancer Mother        died - age 81  . Hypertension Father   . Colon cancer Maternal Grandfather   . Breast cancer Maternal Aunt 60  . Breast cancer Paternal Aunt 29   Social History   Socioeconomic History  . Marital status: Married    Spouse name: Not on file  . Number of children: 2  . Years of education: Not on file  . Highest education level: Not on file  Occupational History    Employer: [employer]  . Financial resource strain: Not on file  . Food insecurity:    Worry: Not on file    Inability: Not on file  . Transportation needs:    Medical: Not on file    Non-medical: Not on file  Tobacco Use  . Smoking status: Never Smoker  . Smokeless tobacco: Never Used  Substance and Sexual Activity  . Alcohol use: No    Alcohol/week: 0.0 standard drinks  . Drug use: No  . Sexual activity: Yes    Birth control/protection: Surgical, Pill  Lifestyle  . Physical activity:    Days per week: Not on file    Minutes per session: Not on file  .  Stress: Not on file  Relationships  . Social connections:    Talks on phone: Not on file    Gets together: Not on file    Attends religious service: Not on file    Active member of club or organization: Not on file    Attends meetings of clubs or organizations: Not on file    Relationship status: Not on file  Other Topics Concern  . Not on file  Social History Narrative  . Not on file    Outpatient Encounter Medications as of 08/26/2018  Medication Sig  . hydrochlorothiazide (MICROZIDE) 12.5 MG capsule TAKE 1 CAPSULE BY MOUTH  DAILY  . lisinopril (PRINIVIL,ZESTRIL) 40 MG tablet TAKE 1 TABLET BY MOUTH  DAILY  . [DISCONTINUED] ibuprofen (ADVIL,MOTRIN) 800 MG tablet Take 1 tablet (800 mg total) by mouth every 8 (eight) hours as needed.   No facility-administered encounter medications on file as of 08/26/2018.     Review of Systems  Constitutional: Negative for appetite change and fever.  HENT: Positive for congestion and sinus pressure.   Respiratory: Positive for cough. Negative for chest tightness and shortness  of breath.   Cardiovascular: Negative for chest pain and leg swelling.  Gastrointestinal: Negative for abdominal pain, diarrhea, nausea and vomiting.  Skin: Negative for color change and rash.  Neurological: Negative for dizziness, light-headedness and headaches.  Psychiatric/Behavioral: Negative for agitation and dysphoric mood.       Objective:    Physical Exam Constitutional:      General: She is not in acute distress.    Appearance: Normal appearance.  HENT:     Right Ear: Tympanic membrane normal.     Left Ear: Tympanic membrane normal.     Nose: Nose normal. No congestion.     Comments: Minimal tenderness to palpation over the sinus.      Mouth/Throat:     Pharynx: No oropharyngeal exudate or posterior oropharyngeal erythema.  Neck:     Musculoskeletal: Neck supple. No muscular tenderness.     Thyroid: No thyromegaly.  Cardiovascular:     Rate and  Rhythm: Normal rate and regular rhythm.  Pulmonary:     Effort: No respiratory distress.     Breath sounds: Normal breath sounds. No wheezing.  Lymphadenopathy:     Cervical: No cervical adenopathy.  Skin:    Findings: No erythema or rash.  Neurological:     Mental Status: She is alert.  Psychiatric:        Mood and Affect: Mood normal.        Behavior: Behavior normal.     BP 118/76 (BP Location: Left Arm, Patient Position: Sitting, Cuff Size: Normal)   Pulse 76   Temp 97.9 F (36.6 C) (Oral)   Resp 16   Wt 236 lb 3.2 oz (107.1 kg)   SpO2 97%   BMI 40.54 kg/m  Wt Readings from Last 3 Encounters:  08/26/18 236 lb 3.2 oz (107.1 kg)  05/08/18 234 lb 12 oz (106.5 kg)  01/02/18 236 lb (107 kg)     Lab Results  Component Value Date   WBC 10.2 09/14/2017   HGB 14.2 09/14/2017   HCT 42.5 09/14/2017   PLT 314 09/14/2017   GLUCOSE 105 (H) 09/14/2017   CHOL 192 02/28/2017   TRIG 177 (H) 02/28/2017   HDL 50 02/28/2017   LDLCALC 107 (H) 02/28/2017   ALT 16 09/14/2017   AST 18 09/14/2017   NA 139 09/14/2017   K 3.5 09/14/2017   CL 104 09/14/2017   CREATININE 0.83 09/14/2017   BUN 11 09/14/2017   CO2 26 09/14/2017   TSH 1.990 02/28/2017   HGBA1C 6.0 (H) 02/28/2017    Mm Digital Screening Bilateral  Result Date: 09/17/2017 CLINICAL DATA:  Screening. EXAM: DIGITAL SCREENING BILATERAL MAMMOGRAM WITH CAD COMPARISON:  Previous exam(s). ACR Breast Density Category b: There are scattered areas of fibroglandular density. FINDINGS: There are no findings suspicious for malignancy. Images were processed with CAD. IMPRESSION: No mammographic evidence of malignancy. A result letter of this screening mammogram will be mailed directly to the patient. RECOMMENDATION: Screening mammogram in one year. (Code:SM-B-01Y) BI-RADS CATEGORY  1: Negative. Electronically Signed   By: Everlean Alstrom M.D.   On: 09/17/2017 10:05       Assessment & Plan:   Problem List Items Addressed This Visit     Hypertension    Blood pressure under good control.  Continue same medication regimen.  Follow pressures.  Follow metabolic panel.        Stress    Overall she feels she is handling things relatively well.  Follow.  Other Visit Diagnoses    Sinus congestion    -  Primary   Sinus pressure and congestion with some cough as outlined.  Nasacort nasal spray and saline nasal spray.  Mucinex/robitussin as directed.  Follow.        Einar Pheasant, MD

## 2018-08-26 NOTE — Assessment & Plan Note (Signed)
Blood pressure under good control.  Continue same medication regimen.  Follow pressures.  Follow metabolic panel.   

## 2018-08-26 NOTE — Assessment & Plan Note (Signed)
Overall she feels she is handling things relatively well.  Follow.  

## 2018-08-26 NOTE — Patient Instructions (Signed)
Saline nasal spray - flush nose at least 2-3x/day  nasacort nasal spray - 2 sprays each nostril one time per day.  Do this in the evening.    mucinex in the am  Robitussin DM in the evening

## 2018-09-16 LAB — URINALYSIS, ROUTINE W REFLEX MICROSCOPIC
Bilirubin, UA: NEGATIVE
Glucose, UA: NEGATIVE
Leukocytes, UA: NEGATIVE
Nitrite, UA: NEGATIVE
Specific Gravity, UA: 1.025 (ref 1.005–1.030)
Urobilinogen, Ur: 0.2 mg/dL (ref 0.2–1.0)
pH, UA: 6 (ref 5.0–7.5)

## 2018-09-16 LAB — BASIC METABOLIC PANEL
BUN/Creatinine Ratio: 13 (ref 9–23)
BUN: 10 mg/dL (ref 6–24)
CHLORIDE: 99 mmol/L (ref 96–106)
CO2: 24 mmol/L (ref 20–29)
Calcium: 9.4 mg/dL (ref 8.7–10.2)
Creatinine, Ser: 0.77 mg/dL (ref 0.57–1.00)
GFR calc Af Amer: 107 mL/min/{1.73_m2} (ref >59)
GFR calc non Af Amer: 93 mL/min/{1.73_m2} (ref >59)
GLUCOSE: 101 mg/dL — AB (ref 65–99)
POTASSIUM: 3.9 mmol/L (ref 3.5–5.2)
SODIUM: 139 mmol/L (ref 134–144)

## 2018-09-16 LAB — HEPATIC FUNCTION PANEL
ALT: 12 IU/L (ref 0–32)
AST: 12 IU/L (ref 0–40)
Albumin: 4.4 g/dL (ref 3.8–4.8)
Alkaline Phosphatase: 94 IU/L (ref 39–117)
BILIRUBIN TOTAL: 0.3 mg/dL (ref 0.0–1.2)
Bilirubin, Direct: 0.08 mg/dL (ref 0.00–0.40)
Total Protein: 7 g/dL (ref 6.0–8.5)

## 2018-09-16 LAB — HEMOGLOBIN A1C
Est. average glucose Bld gHb Est-mCnc: 140 mg/dL
Hgb A1c MFr Bld: 6.5 % — ABNORMAL HIGH (ref 4.8–5.6)

## 2018-09-16 LAB — MICROSCOPIC EXAMINATION: Casts: NONE SEEN /lpf

## 2018-09-16 LAB — CBC WITH DIFFERENTIAL/PLATELET
Basophils Absolute: 0.1 x10E3/uL (ref 0.0–0.2)
Basos: 1 %
EOS (ABSOLUTE): 0.2 x10E3/uL (ref 0.0–0.4)
Eos: 2 %
Hematocrit: 40.7 % (ref 34.0–46.6)
Hemoglobin: 13.6 g/dL (ref 11.1–15.9)
Immature Grans (Abs): 0.1 x10E3/uL (ref 0.0–0.1)
Immature Granulocytes: 1 %
Lymphocytes Absolute: 2.7 x10E3/uL (ref 0.7–3.1)
Lymphs: 25 %
MCH: 28.4 pg (ref 26.6–33.0)
MCHC: 33.4 g/dL (ref 31.5–35.7)
MCV: 85 fL (ref 79–97)
Monocytes Absolute: 0.9 x10E3/uL (ref 0.1–0.9)
Monocytes: 8 %
Neutrophils Absolute: 7 x10E3/uL (ref 1.4–7.0)
Neutrophils: 63 %
Platelets: 366 x10E3/uL (ref 150–450)
RBC: 4.79 x10E6/uL (ref 3.77–5.28)
RDW: 12.7 % (ref 11.7–15.4)
WBC: 10.8 x10E3/uL (ref 3.4–10.8)

## 2018-09-16 LAB — LIPID PANEL
Chol/HDL Ratio: 5.1 ratio — ABNORMAL HIGH (ref 0.0–4.4)
Cholesterol, Total: 223 mg/dL — ABNORMAL HIGH (ref 100–199)
HDL: 44 mg/dL
LDL Calculated: 129 mg/dL — ABNORMAL HIGH (ref 0–99)
Triglycerides: 252 mg/dL — ABNORMAL HIGH (ref 0–149)
VLDL Cholesterol Cal: 50 mg/dL — ABNORMAL HIGH (ref 5–40)

## 2018-09-16 LAB — TSH: TSH: 2.62 u[IU]/mL (ref 0.450–4.500)

## 2018-09-17 ENCOUNTER — Encounter: Payer: Self-pay | Admitting: Internal Medicine

## 2018-09-17 ENCOUNTER — Ambulatory Visit (INDEPENDENT_AMBULATORY_CARE_PROVIDER_SITE_OTHER): Payer: Managed Care, Other (non HMO) | Admitting: Internal Medicine

## 2018-09-17 VITALS — BP 110/80 | HR 65 | Temp 98.7°F | Wt 236.2 lb

## 2018-09-17 DIAGNOSIS — Z1231 Encounter for screening mammogram for malignant neoplasm of breast: Secondary | ICD-10-CM | POA: Diagnosis not present

## 2018-09-17 DIAGNOSIS — Z0000 Encounter for general adult medical examination without abnormal findings: Secondary | ICD-10-CM | POA: Diagnosis not present

## 2018-09-17 DIAGNOSIS — I10 Essential (primary) hypertension: Secondary | ICD-10-CM

## 2018-09-17 DIAGNOSIS — E7800 Pure hypercholesterolemia, unspecified: Secondary | ICD-10-CM | POA: Diagnosis not present

## 2018-09-17 DIAGNOSIS — F439 Reaction to severe stress, unspecified: Secondary | ICD-10-CM | POA: Diagnosis not present

## 2018-09-17 DIAGNOSIS — Z6841 Body Mass Index (BMI) 40.0 and over, adult: Secondary | ICD-10-CM

## 2018-09-17 DIAGNOSIS — R739 Hyperglycemia, unspecified: Secondary | ICD-10-CM

## 2018-09-17 DIAGNOSIS — K219 Gastro-esophageal reflux disease without esophagitis: Secondary | ICD-10-CM

## 2018-09-17 MED ORDER — LISINOPRIL 40 MG PO TABS: 40.0000 mg | ORAL_TABLET | Freq: Every day | ORAL | 1 refills | Status: DC

## 2018-09-17 MED ORDER — ROSUVASTATIN CALCIUM 10 MG PO TABS: 10.0000 mg | ORAL_TABLET | Freq: Every day | ORAL | 2 refills | Status: DC

## 2018-09-17 NOTE — Patient Instructions (Addendum)
pepcid 20mg  - take 30 minutes before breakfast  Have a non fasting lab drawn at [facility] in 6 weeks.  (will check liver panel)

## 2018-09-17 NOTE — Progress Notes (Signed)
[name], female   DOB: 1970/11/25, 48 y.o.   MRN: [id number]   Subjective:    [name], female    DOB: 10-Jun-1971, 48 y.o.   MRN: [id number]  HPI  Patient here for her physical exam.   She reports she is doing relatively well.  Increased stress with her husband's health issues.  Discussed with her today.  Overall she feels she is handling things relatively well.  Has gained weight.  Cholesterol elevated.  Sugar elevated.  Discussed diet and exercise.  Discussed calculated cholesterol risk.  Discussed starting a cholesterol medication.  She is agreeable.  No chest pain.  No sob.  Question of acid reflux. Bowels moving.  No urine change.     Past Medical History:  Diagnosis Date  . Abnormal pap    required freezing  . Hypercholesterolemia   . Hypertension   . Microscopic hematuria    s/p renal biopsy  . Thin basement membrane disease    Past Surgical History:  Procedure Laterality Date  . TONSILLECTOMY    . TUBAL LIGATION     Family History  Problem Relation Age of Onset  . Colon cancer Mother        died - age 83  . Hypertension Father   . Colon cancer Maternal Grandfather   . Breast cancer Maternal Aunt 60  . Breast cancer Paternal Aunt 27   Social History   Socioeconomic History  . Marital status: Married    Spouse name: Not on file  . Number of children: 2  . Years of education: Not on file  . Highest education level: Not on file  Occupational History    Employer: [employer]  . Financial resource strain: Not on file  . Food insecurity:    Worry: Not on file    Inability: Not on file  . Transportation needs:    Medical: Not on file    Non-medical: Not on file  Tobacco Use  . Smoking status: Never Smoker  . Smokeless tobacco: Never Used  Substance and Sexual Activity  . Alcohol use: No    Alcohol/week: 0.0 standard drinks  . Drug use: No  . Sexual activity: Yes    Birth control/protection: Surgical  Lifestyle    . Physical activity:    Days per week: Not on file    Minutes per session: Not on file  . Stress: Not on file  Relationships  . Social connections:    Talks on phone: Not on file    Gets together: Not on file    Attends religious service: Not on file    Active member of club or organization: Not on file    Attends meetings of clubs or organizations: Not on file    Relationship status: Not on file  Other Topics Concern  . Not on file  Social History Narrative  . Not on file    Outpatient Encounter Medications as of 09/17/2018  Medication Sig  . hydrochlorothiazide (MICROZIDE) 12.5 MG capsule TAKE 1 CAPSULE BY MOUTH  DAILY  . lisinopril (PRINIVIL,ZESTRIL) 40 MG tablet Take 1 tablet (40 mg total) by mouth daily.  . [DISCONTINUED] lisinopril (PRINIVIL,ZESTRIL) 40 MG tablet TAKE 1 TABLET BY MOUTH  DAILY  . rosuvastatin (CRESTOR) 10 MG tablet Take 1 tablet (10 mg total) by mouth daily.   No facility-administered encounter medications on file as of 09/17/2018.     Review of Systems  Constitutional: Negative for  appetite change and unexpected weight change.  HENT: Negative for congestion and sinus pressure.   Eyes: Negative for pain and visual disturbance.  Respiratory: Negative for cough, chest tightness and shortness of breath.   Cardiovascular: Negative for chest pain, palpitations and leg swelling.  Gastrointestinal: Negative for abdominal pain, diarrhea, nausea and vomiting.  Genitourinary: Negative for difficulty urinating and dysuria.  Musculoskeletal: Negative for joint swelling and myalgias.  Skin: Negative for color change and rash.  Neurological: Negative for dizziness, light-headedness and headaches.  Hematological: Negative for adenopathy. Does not bruise/bleed easily.  Psychiatric/Behavioral: Negative for agitation and dysphoric mood.       Objective:    Physical Exam Constitutional:      General: She is not in acute distress.    Appearance: Normal appearance.  She is well-developed.  HENT:     Nose: Nose normal. No congestion.     Mouth/Throat:     Pharynx: No oropharyngeal exudate or posterior oropharyngeal erythema.  Eyes:     General: No scleral icterus.       Right eye: No discharge.        Left eye: No discharge.  Neck:     Musculoskeletal: Neck supple. No muscular tenderness.     Thyroid: No thyromegaly.  Cardiovascular:     Rate and Rhythm: Normal rate and regular rhythm.  Pulmonary:     Effort: No tachypnea, accessory muscle usage or respiratory distress.     Breath sounds: Normal breath sounds. No decreased breath sounds or wheezing.  Chest:     Breasts:        Right: No inverted nipple, mass, nipple discharge or tenderness (no axillary adenopathy).        Left: No inverted nipple, mass, nipple discharge or tenderness (no axilarry adenopathy).  Abdominal:     General: Bowel sounds are normal.     Palpations: Abdomen is soft.     Tenderness: There is no abdominal tenderness.  Musculoskeletal:        General: No swelling or tenderness.  Lymphadenopathy:     Cervical: No cervical adenopathy.  Skin:    Findings: No erythema or rash.  Neurological:     Mental Status: She is alert and oriented to person, place, and time.  Psychiatric:        Mood and Affect: Mood normal.        Behavior: Behavior normal.     BP 110/80   Pulse 65   Temp 98.7 F (37.1 C) (Oral)   Wt 236 lb 3.2 oz (107.1 kg)   LMP 09/10/2018 (Exact Date)   SpO2 96%   BMI 40.54 kg/m  Wt Readings from Last 3 Encounters:  09/19/18 236 lb (107 kg)  09/18/18 236 lb (107 kg)  09/17/18 236 lb 3.2 oz (107.1 kg)     Lab Results  Component Value Date   WBC 12.5 (H) 09/19/2018   HGB 13.3 09/19/2018   HCT 40.7 09/19/2018   PLT 322 09/19/2018   GLUCOSE 139 (H) 09/19/2018   CHOL 223 (H) 09/15/2018   TRIG 252 (H) 09/15/2018   HDL 44 09/15/2018   LDLCALC 129 (H) 09/15/2018   ALT 15 09/19/2018   AST 18 09/19/2018   NA 137 09/19/2018   K 3.3 (L)  09/19/2018   CL 102 09/19/2018   CREATININE 0.71 09/19/2018   BUN 14 09/19/2018   CO2 25 09/19/2018   TSH 2.620 09/15/2018   HGBA1C 6.5 (H) 09/15/2018    Mm Digital Screening Bilateral  Result Date: 09/17/2017 CLINICAL DATA:  Screening. EXAM: DIGITAL SCREENING BILATERAL MAMMOGRAM WITH CAD COMPARISON:  Previous exam(s). ACR Breast Density Category b: There are scattered areas of fibroglandular density. FINDINGS: There are no findings suspicious for malignancy. Images were processed with CAD. IMPRESSION: No mammographic evidence of malignancy. A result letter of this screening mammogram will be mailed directly to the patient. RECOMMENDATION: Screening mammogram in one year. (Code:SM-B-01Y) BI-RADS CATEGORY  1: Negative. Electronically Signed   By: Everlean Alstrom M.D.   On: 09/17/2017 10:05       Assessment & Plan:   Problem List Items Addressed This Visit    Acid reflux    Question of acid reflux.  Start pepcid.        BMI 40.0-44.9, adult ([facility])    Discussed diet and exercise.  Follow.        Health care maintenance    Physical today 09/17/18.  PAP 03/01/17 - negative with negative HPV.  Mammogram 09/17/17 - [facility] I.  Schedule f/u mammogram.        Hypercholesterolemia    Discussed calculated cholesterol risk.  Discussed low cholesterol diet and exercise.  Follow lipid panel and liver function tests.  Start crestor.        Relevant Medications   lisinopril (PRINIVIL,ZESTRIL) 40 MG tablet   rosuvastatin (CRESTOR) 10 MG tablet   Other Relevant Orders   Hepatic function panel   Hyperglycemia    Low carb diet and exercise.  Follow met b and a1c.        Hypertension    Blood pressure under good control.  Continue same medication regimen.  Follow pressures.  Follow metabolic panel.        Relevant Medications   lisinopril (PRINIVIL,ZESTRIL) 40 MG tablet   rosuvastatin (CRESTOR) 10 MG tablet   Stress    Discussed increased stress.  Overall she feels she is handling things  relatively well.  Follow.         Other Visit Diagnoses    Routine general medical examination at a health care facility    -  Primary   Visit for screening mammogram       Relevant Orders   MM 3D SCREEN BREAST BILATERAL       Einar Pheasant, MD

## 2018-09-18 ENCOUNTER — Emergency Department
Admission: EM | Admit: 2018-09-18 | Discharge: 2018-09-18 | Disposition: A | Discharge status: Home / Self Care | Payer: Managed Care, Other (non HMO) | Attending: Student in an Organized Health Care Education/Training Program | Admitting: Student in an Organized Health Care Education/Training Program

## 2018-09-18 ENCOUNTER — Other Ambulatory Visit: Payer: Self-pay

## 2018-09-18 ENCOUNTER — Encounter: Payer: Self-pay | Admitting: Emergency Medicine

## 2018-09-18 ENCOUNTER — Emergency Department: Payer: Managed Care, Other (non HMO)

## 2018-09-18 ENCOUNTER — Encounter: Payer: Self-pay | Admitting: Internal Medicine

## 2018-09-18 DIAGNOSIS — W5501XA Bitten by cat, initial encounter: Secondary | ICD-10-CM | POA: Insufficient documentation

## 2018-09-18 DIAGNOSIS — S6992XA Unspecified injury of left wrist, hand and finger(s), initial encounter: Secondary | ICD-10-CM | POA: Diagnosis present

## 2018-09-18 DIAGNOSIS — Z23 Encounter for immunization: Secondary | ICD-10-CM | POA: Insufficient documentation

## 2018-09-18 DIAGNOSIS — Y939 Activity, unspecified: Secondary | ICD-10-CM | POA: Diagnosis not present

## 2018-09-18 DIAGNOSIS — I10 Essential (primary) hypertension: Secondary | ICD-10-CM | POA: Insufficient documentation

## 2018-09-18 DIAGNOSIS — S61432A Puncture wound without foreign body of left hand, initial encounter: Secondary | ICD-10-CM | POA: Insufficient documentation

## 2018-09-18 DIAGNOSIS — Y999 Unspecified external cause status: Secondary | ICD-10-CM | POA: Insufficient documentation

## 2018-09-18 DIAGNOSIS — Z79899 Other long term (current) drug therapy: Secondary | ICD-10-CM | POA: Insufficient documentation

## 2018-09-18 DIAGNOSIS — Y929 Unspecified place or not applicable: Secondary | ICD-10-CM | POA: Diagnosis not present

## 2018-09-18 MED ORDER — NAPROXEN 500 MG PO TABS: 500.0000 mg | ORAL_TABLET | Freq: Two times a day (BID) | ORAL | 0 refills | Status: DC

## 2018-09-18 MED ORDER — BACITRACIN-NEOMYCIN-POLYMYXIN 400-5-5000 EX OINT
TOPICAL_OINTMENT | Freq: Once | CUTANEOUS | Status: AC
  Administered 2018-09-18: 1 via TOPICAL
  Filled 2018-09-18: qty 1

## 2018-09-18 MED ORDER — AMOXICILLIN-POT CLAVULANATE 875-125 MG PO TABS: 1.0000 | ORAL_TABLET | Freq: Two times a day (BID) | ORAL | 0 refills | Status: DC

## 2018-09-18 MED ORDER — TETANUS-DIPHTH-ACELL PERTUSSIS 5-2.5-18.5 LF-MCG/0.5 IM SUSP
0.5000 mL | Freq: Once | INTRAMUSCULAR | Status: AC
  Administered 2018-09-18: 0.5 mL via INTRAMUSCULAR
  Filled 2018-09-18: qty 0.5

## 2018-09-18 MED ORDER — HYDROCODONE-ACETAMINOPHEN 5-325 MG PO TABS: 1.0000 | ORAL_TABLET | Freq: Four times a day (QID) | ORAL | 0 refills | Status: AC | PRN

## 2018-09-18 MED ORDER — HYDROCODONE-ACETAMINOPHEN 5-325 MG PO TABS: 1.0000 | ORAL_TABLET | Freq: Four times a day (QID) | ORAL | 0 refills | Status: DC | PRN

## 2018-09-18 MED ORDER — AMOXICILLIN-POT CLAVULANATE 875-125 MG PO TABS
1.0000 | ORAL_TABLET | Freq: Once | ORAL | Status: AC
  Administered 2018-09-18: 1 via ORAL
  Filled 2018-09-18: qty 1

## 2018-09-18 MED ORDER — HYDROCODONE-ACETAMINOPHEN 5-325 MG PO TABS
1.0000 | ORAL_TABLET | Freq: Once | ORAL | Status: AC
  Administered 2018-09-18: 1 via ORAL
  Filled 2018-09-18: qty 1

## 2018-09-18 NOTE — ED Provider Notes (Signed)
[facility] Emergency Department Provider Note  ____________________________________________  Time seen: Approximately 4:42 PM  I have reviewed the triage vital signs and the nursing notes.   HISTORY  Chief Complaint Animal Bite   HPI Kathleen Espinoza is a 48 y.o. female who presents to the emergency department for treatment and evaluation after being bitten by her daughter's cat this morning.  Is reportedly up-to-date on the vaccinations.  Patient has pain and swelling in the left hand.  No alleviating measures attempted prior to arrival.   Past Medical History:  Diagnosis Date  . Abnormal pap    required freezing  . Hypercholesterolemia   . Hypertension   . Microscopic hematuria    s/p renal biopsy  . Thin basement membrane disease     Patient Active Problem List   Diagnosis Date Noted  . Stress 01/05/2018  . Right lower quadrant abdominal pain   . Basal cell carcinoma 09/01/2017  . Left tennis elbow 08/21/2015  . Facial lesion 04/09/2015  . Rib pain 04/09/2015  . Health care maintenance 04/09/2015  . SOB (shortness of breath) 01/24/2014  . BMI 40.0-44.9, adult ([place]) 01/24/2014  . Irregular menstrual cycle 09/20/2013  . Hyperglycemia 04/27/2013  . Hematuria 06/02/2012  . Hypercholesterolemia 06/02/2012  . Renal cyst 06/02/2012  . Hypertension 05/26/2012    Past Surgical History:  Procedure Laterality Date  . TONSILLECTOMY    . TUBAL LIGATION      Prior to Admission medications   Medication Sig Start Date End Date Taking? Authorizing Provider  amoxicillin-clavulanate (AUGMENTIN) 875-125 MG tablet Take 1 tablet by mouth 2 (two) times daily. 09/18/18   Kolby Schara, Johnette Abraham B, FNP  hydrochlorothiazide (MICROZIDE) 12.5 MG capsule TAKE 1 CAPSULE BY MOUTH  DAILY 08/19/18   Einar Pheasant, MD  HYDROcodone-acetaminophen (NORCO/VICODIN) 5-325 MG tablet Take 1 tablet by mouth every 6 (six) hours as needed for up to 3 days for severe pain. 09/18/18 09/21/18   Macklen Wilhoite, Johnette Abraham B, FNP  lisinopril (PRINIVIL,ZESTRIL) 40 MG tablet Take 1 tablet (40 mg total) by mouth daily. 09/17/18   Einar Pheasant, MD  naproxen (NAPROSYN) 500 MG tablet Take 1 tablet (500 mg total) by mouth 2 (two) times daily with a meal. 09/18/18   Shamyra Farias B, FNP  rosuvastatin (CRESTOR) 10 MG tablet Take 1 tablet (10 mg total) by mouth daily. 09/17/18   Einar Pheasant, MD    Allergies Norvasc [amlodipine besylate]; Betadine [povidone iodine]; and Iodine  Family History  Problem Relation Age of Onset  . Colon cancer Mother        died - age 2  . Hypertension Father   . Colon cancer Maternal Grandfather   . Breast cancer Maternal Aunt 60  . Breast cancer Paternal Aunt 56    Social History Social History   Tobacco Use  . Smoking status: Never Smoker  . Smokeless tobacco: Never Used  Substance Use Topics  . Alcohol use: No    Alcohol/week: 0.0 standard drinks  . Drug use: No    Review of Systems  Constitutional: Negative for fever. Respiratory: Negative for cough or shortness of breath.  Musculoskeletal: Negative for myalgias Skin: Positive for puncture wounds on left hand Neurological: Negative for numbness or paresthesias. ____________________________________________   PHYSICAL EXAM:  VITAL SIGNS: ED Triage Vitals  Enc Vitals Group     BP 09/18/18 1634 (!) 140/54     Pulse Rate 09/18/18 1634 85     Resp 09/18/18 1634 16     Temp  09/18/18 1634 98.5 F (36.9 C)     Temp Source 09/18/18 1634 Oral     SpO2 09/18/18 1634 100 %     Weight 09/18/18 1635 236 lb (107 kg)     Height 09/18/18 1635 5\' 2"  (1.575 m)     Head Circumference --      Peak Flow --      Pain Score 09/18/18 1634 8     Pain Loc --      Pain Edu? --      Excl. in [place]? --      Constitutional: Well appearing. Eyes: Conjunctivae are clear without discharge or drainage. Nose: No rhinorrhea noted. Mouth/Throat: Airway is patent.  Neck: No stridor. Unrestricted range of motion  observed. Cardiovascular: Capillary refill is <3 seconds.  Respiratory: Respirations are even and unlabored.. Musculoskeletal: Unrestricted range of motion observed. Neurologic: Awake, alert, and oriented x 4.  Skin:  Left hand diffusely swollen with puncture wounds on the dorsal and palmar aspect of the 5th metacarpal. Dorsal wound is oozing serosanguinous fluid. Palmar wound with dried blood.   ____________________________________________   LABS (all labs ordered are listed, but only abnormal results are displayed)  Labs Reviewed - No data to display ____________________________________________  EKG  Not indicated. ____________________________________________  RADIOLOGY  Image of the left hand is reassuring.  No retained foreign body or bony abnormality noted. ____________________________________________   PROCEDURES  Procedures ____________________________________________   INITIAL IMPRESSION / ASSESSMENT AND PLAN / ED COURSE  Kathleen Espinoza is a 48 y.o. female who presents to the ER for treatment and evaluation after being bitten by her daughter's cat this morning. Swelling and pain has increased throughout the day.   ----------------------------------------- 5:20 PM on 09/18/2018 ----------------------------------------- X-ray of the hand does not show concern of retained foreign body or bone injury.  Patient will be discharged home with prescription for Augmentin, Norco, and Naprosyn.  She will be given wound care instructions as well.  Prior to discharge, hand will be cleaned and Neosporin and dressing will be applied.  Medications  Tdap (BOOSTRIX) injection 0.5 mL (0.5 mLs Intramuscular Given 09/18/18 1705)  HYDROcodone-acetaminophen (NORCO/VICODIN) 5-325 MG per tablet 1 tablet (1 tablet Oral Given 09/18/18 1704)  amoxicillin-clavulanate (AUGMENTIN) 875-125 MG per tablet 1 tablet (1 tablet Oral Given 09/18/18 1704)  neomycin-bacitracin-polymyxin (NEOSPORIN) ointment  packet (1 application Topical Given 09/18/18 1731)     Pertinent labs & imaging results that were available during my care of the patient were reviewed by me and considered in my medical decision making (see chart for details).  ____________________________________________   FINAL CLINICAL IMPRESSION(S) / ED DIAGNOSES  Final diagnoses:  Cat bite, initial encounter    ED Discharge Orders         Ordered    amoxicillin-clavulanate (AUGMENTIN) 875-125 MG tablet  2 times daily     09/18/18 1722    HYDROcodone-acetaminophen (NORCO/VICODIN) 5-325 MG tablet  Every 6 hours PRN,   Status:  Discontinued     09/18/18 1722    naproxen (NAPROSYN) 500 MG tablet  2 times daily with meals     09/18/18 1722    HYDROcodone-acetaminophen (NORCO/VICODIN) 5-325 MG tablet  Every 6 hours PRN     09/18/18 1733           Note:  This document was prepared using Dragon voice recognition software and may include unintentional dictation errors.    Victorino Dike, FNP 09/18/18 2228    Merlyn Lot, MD 09/18/18 [phone]

## 2018-09-18 NOTE — ED Notes (Signed)
X-ray at bedside

## 2018-09-18 NOTE — Telephone Encounter (Signed)
Pt is on the way to walk in

## 2018-09-18 NOTE — ED Triage Notes (Signed)
Pt in via POV, reports cat bite this morning.  Cat is her daughters, states the "She got spooked and got me."  Cat is up to date on vaccines.  Pt reports worsening swelling and pain to left hand, states it is painful to use hand.  Vitals WDL, NAD noted at this time.

## 2018-09-18 NOTE — Telephone Encounter (Signed)
Given cat bite and open wound, feel needs to be seen.  Probably will need abx, etc.  Will need tetanus booster, etc.

## 2018-09-18 NOTE — ED Notes (Signed)
Bite cleaned and dressed

## 2018-09-19 ENCOUNTER — Emergency Department
Admission: EM | Admit: 2018-09-19 | Discharge: 2018-09-19 | Disposition: A | Discharge status: Home / Self Care | Payer: Managed Care, Other (non HMO) | Attending: Emergency Medicine | Admitting: Emergency Medicine

## 2018-09-19 ENCOUNTER — Encounter: Payer: Self-pay | Admitting: Emergency Medicine

## 2018-09-19 ENCOUNTER — Other Ambulatory Visit: Payer: Self-pay

## 2018-09-19 DIAGNOSIS — L03114 Cellulitis of left upper limb: Secondary | ICD-10-CM | POA: Diagnosis not present

## 2018-09-19 DIAGNOSIS — I10 Essential (primary) hypertension: Secondary | ICD-10-CM | POA: Insufficient documentation

## 2018-09-19 DIAGNOSIS — Z85828 Personal history of other malignant neoplasm of skin: Secondary | ICD-10-CM | POA: Diagnosis not present

## 2018-09-19 DIAGNOSIS — W5501XD Bitten by cat, subsequent encounter: Secondary | ICD-10-CM | POA: Diagnosis not present

## 2018-09-19 DIAGNOSIS — Z79899 Other long term (current) drug therapy: Secondary | ICD-10-CM | POA: Diagnosis not present

## 2018-09-19 DIAGNOSIS — S61452D Open bite of left hand, subsequent encounter: Secondary | ICD-10-CM | POA: Diagnosis not present

## 2018-09-19 DIAGNOSIS — R2232 Localized swelling, mass and lump, left upper limb: Secondary | ICD-10-CM | POA: Diagnosis present

## 2018-09-19 LAB — COMPREHENSIVE METABOLIC PANEL
ALBUMIN: 4.3 g/dL (ref 3.5–5.0)
ALT: 15 U/L (ref 0–44)
AST: 18 U/L (ref 15–41)
Alkaline Phosphatase: 79 U/L (ref 38–126)
Anion gap: 10 (ref 5–15)
BUN: 14 mg/dL (ref 6–20)
CO2: 25 mmol/L (ref 22–32)
Calcium: 8.9 mg/dL (ref 8.9–10.3)
Chloride: 102 mmol/L (ref 98–111)
Creatinine, Ser: 0.71 mg/dL (ref 0.44–1.00)
GFR calc Af Amer: >60 mL/min (ref >60)
GFR calc non Af Amer: >60 mL/min (ref >60)
Glucose, Bld: 139 mg/dL — ABNORMAL HIGH (ref 70–99)
Potassium: 3.3 mmol/L — ABNORMAL LOW (ref 3.5–5.1)
Sodium: 137 mmol/L (ref 135–145)
Total Bilirubin: 0.9 mg/dL (ref 0.3–1.2)
Total Protein: 7.5 g/dL (ref 6.5–8.1)

## 2018-09-19 LAB — CBC WITH DIFFERENTIAL/PLATELET
Abs Immature Granulocytes: 0.06 10*3/uL (ref 0.00–0.07)
Basophils Absolute: 0.1 10*3/uL (ref 0.0–0.1)
Basophils Relative: 1 %
Eosinophils Absolute: 0.3 10*3/uL (ref 0.0–0.5)
Eosinophils Relative: 2 %
HCT: 40.7 % (ref 36.0–46.0)
Hemoglobin: 13.3 g/dL (ref 12.0–15.0)
Immature Granulocytes: 1 %
Lymphocytes Relative: 16 %
Lymphs Abs: 2 10*3/uL (ref 0.7–4.0)
MCH: 27.8 pg (ref 26.0–34.0)
MCHC: 32.7 g/dL (ref 30.0–36.0)
MCV: 85.1 fL (ref 80.0–100.0)
MONO ABS: 1 10*3/uL (ref 0.1–1.0)
Monocytes Relative: 8 %
Neutro Abs: 9.1 10*3/uL — ABNORMAL HIGH (ref 1.7–7.7)
Neutrophils Relative %: 72 %
PLATELETS: 322 10*3/uL (ref 150–400)
RBC: 4.78 MIL/uL (ref 3.87–5.11)
RDW: 12.9 % (ref 11.5–15.5)
WBC: 12.5 10*3/uL — AB (ref 4.0–10.5)
nRBC: 0 % (ref 0.0–0.2)

## 2018-09-19 MED ORDER — KETOROLAC TROMETHAMINE 30 MG/ML IJ SOLN
15.0000 mg | Freq: Once | INTRAMUSCULAR | Status: AC
  Administered 2018-09-19: 15 mg via INTRAVENOUS
  Filled 2018-09-19: qty 1

## 2018-09-19 MED ORDER — CLINDAMYCIN PHOSPHATE 600 MG/50ML IV SOLN
600.0000 mg | Freq: Once | INTRAVENOUS | Status: AC
  Administered 2018-09-19: 600 mg via INTRAVENOUS
  Filled 2018-09-19: qty 50

## 2018-09-19 MED ORDER — SODIUM CHLORIDE 0.9 % IV SOLN
3.0000 g | Freq: Once | INTRAVENOUS | Status: AC
  Administered 2018-09-19: 3 g via INTRAVENOUS
  Filled 2018-09-19: qty 3

## 2018-09-19 MED ORDER — CLINDAMYCIN HCL 300 MG PO CAPS: 300.0000 mg | ORAL_CAPSULE | Freq: Three times a day (TID) | ORAL | 0 refills | Status: AC

## 2018-09-19 MED ORDER — ONDANSETRON HCL 4 MG/2ML IJ SOLN
4.0000 mg | Freq: Once | INTRAMUSCULAR | Status: AC
  Administered 2018-09-19: 4 mg via INTRAVENOUS
  Filled 2018-09-19: qty 2

## 2018-09-19 MED ORDER — ONDANSETRON 4 MG PO TBDP: 4.0000 mg | ORAL_TABLET | Freq: Three times a day (TID) | ORAL | 0 refills | Status: DC | PRN

## 2018-09-19 NOTE — ED Provider Notes (Signed)
[facility] Emergency Department Provider Note  ____________________________________________  Time seen: Approximately 3:01 PM  I have reviewed the triage vital signs and the nursing notes.   HISTORY  Chief Complaint hand swelling   HPI Kathleen Espinoza is a 48 y.o. female with a history of hypertension and hyperlipidemia who presents for evaluation of left hand swelling.  Patient yesterday was bitten by her daughter's cat.  Cat is vaccinated.  Patient was seen here yesterday with a negative x-ray showing no retained foreign bodies.  She was started on Augmentin.  The wound was washed thoroughly and she was given a tetanus booster.  The swelling and redness have become progressively worsened.  She is complained of 5 out of 10 throbbing sharp pain.  No fever chills but she has had some nausea.  She has noticed that the redness is spread to her forearm as well.  Past Medical History:  Diagnosis Date  . Abnormal pap    required freezing  . Hypercholesterolemia   . Hypertension   . Microscopic hematuria    s/p renal biopsy  . Thin basement membrane disease     Patient Active Problem List   Diagnosis Date Noted  . Stress 01/05/2018  . Right lower quadrant abdominal pain   . Basal cell carcinoma 09/01/2017  . Left tennis elbow 08/21/2015  . Facial lesion 04/09/2015  . Rib pain 04/09/2015  . Health care maintenance 04/09/2015  . SOB (shortness of breath) 01/24/2014  . BMI 40.0-44.9, adult ([place]) 01/24/2014  . Irregular menstrual cycle 09/20/2013  . Hyperglycemia 04/27/2013  . Hematuria 06/02/2012  . Hypercholesterolemia 06/02/2012  . Renal cyst 06/02/2012  . Hypertension 05/26/2012    Past Surgical History:  Procedure Laterality Date  . TONSILLECTOMY    . TUBAL LIGATION      Prior to Admission medications   Medication Sig Start Date End Date Taking? Authorizing Provider  amoxicillin-clavulanate (AUGMENTIN) 875-125 MG tablet Take 1 tablet by  mouth 2 (two) times daily. 09/18/18   Triplett, Johnette Abraham B, FNP  clindamycin (CLEOCIN) 300 MG capsule Take 1 capsule (300 mg total) by mouth 3 (three) times daily for 10 days. 09/19/18 09/29/18  Rudene Re, MD  hydrochlorothiazide (MICROZIDE) 12.5 MG capsule TAKE 1 CAPSULE BY MOUTH  DAILY 08/19/18   Einar Pheasant, MD  HYDROcodone-acetaminophen (NORCO/VICODIN) 5-325 MG tablet Take 1 tablet by mouth every 6 (six) hours as needed for up to 3 days for severe pain. 09/18/18 09/21/18  Triplett, Johnette Abraham B, FNP  lisinopril (PRINIVIL,ZESTRIL) 40 MG tablet Take 1 tablet (40 mg total) by mouth daily. 09/17/18   Einar Pheasant, MD  naproxen (NAPROSYN) 500 MG tablet Take 1 tablet (500 mg total) by mouth 2 (two) times daily with a meal. 09/18/18   Triplett, Cari B, FNP  rosuvastatin (CRESTOR) 10 MG tablet Take 1 tablet (10 mg total) by mouth daily. 09/17/18   Einar Pheasant, MD    Allergies Norvasc [amlodipine besylate]; Betadine [povidone iodine]; and Iodine  Family History  Problem Relation Age of Onset  . Colon cancer Mother        died - age 36  . Hypertension Father   . Colon cancer Maternal Grandfather   . Breast cancer Maternal Aunt 60  . Breast cancer Paternal Aunt 24    Social History Social History   Tobacco Use  . Smoking status: Never Smoker  . Smokeless tobacco: Never Used  Substance Use Topics  . Alcohol use: No    Alcohol/week: 0.0 standard  drinks  . Drug use: No    Review of Systems  Constitutional: Negative for fever. Eyes: Negative for visual changes. ENT: Negative for sore throat. Neck: No neck pain  Cardiovascular: Negative for chest pain. Respiratory: Negative for shortness of breath. Gastrointestinal: Negative for abdominal pain, vomiting or diarrhea. Genitourinary: Negative for dysuria. Musculoskeletal: Negative for back pain. + swelling and pain of the L hand Skin: Negative for rash. Neurological: Negative for headaches, weakness or numbness. Psych: No SI or  HI  ____________________________________________   PHYSICAL EXAM:  VITAL SIGNS: ED Triage Vitals  Enc Vitals Group     BP 09/19/18 1314 (!) 134/55     Pulse Rate 09/19/18 1314 77     Resp 09/19/18 1314 18     Temp 09/19/18 1314 97.7 F (36.5 C)     Temp Source 09/19/18 1314 Oral     SpO2 09/19/18 1314 100 %     Weight 09/19/18 1309 236 lb (107 kg)     Height 09/19/18 1309 5\' 2"  (1.575 m)     Head Circumference --      Peak Flow --      Pain Score 09/19/18 1309 5     Pain Loc --      Pain Edu? --      Excl. in [place]? --     Constitutional: Alert and oriented. Well appearing and in no apparent distress. HEENT:      Head: Normocephalic and atraumatic.         Eyes: Conjunctivae are normal. Sclera is non-icteric.       Mouth/Throat: Mucous membranes are moist.       Neck: Supple with no signs of meningismus. Cardiovascular: Regular rate and rhythm. No murmurs, gallops, or rubs. 2+ symmetrical distal pulses are present in all extremities. No JVD. Respiratory: Normal respiratory effort. Lungs are clear to auscultation bilaterally. No wheezes, crackles, or rhonchi.  Gastrointestinal: Soft, non tender, and non distended with positive bowel sounds. No rebound or guarding. Musculoskeletal: 2 puncture wounds seen on the dorsum of the left hand with surrounding swelling, erythema and warmth, erythema has spread to the forearm as well. Neurologic: Normal speech and language. Face is symmetric. Moving all extremities. No gross focal neurologic deficits are appreciated. Skin: Skin is warm, dry and intact. No rash noted. Psychiatric: Mood and affect are normal. Speech and behavior are normal.  ____________________________________________   LABS (all labs ordered are listed, but only abnormal results are displayed)  Labs Reviewed  CBC WITH DIFFERENTIAL/PLATELET - Abnormal; Notable for the following components:      Result Value   WBC 12.5 (*)    Neutro Abs 9.1 (*)    All other  components within normal limits  COMPREHENSIVE METABOLIC PANEL - Abnormal; Notable for the following components:   Potassium 3.3 (*)    Glucose, Bld 139 (*)    All other components within normal limits   ____________________________________________  EKG  none  ____________________________________________  RADIOLOGY  I have personally reviewed the images performed during this visit and I agree with the Radiologist's read.   Interpretation by Radiologist:  Dg Hand Complete Left  Result Date: 09/18/2018 CLINICAL DATA:  Cat bite to left hand and thumb with redness and swelling. EXAM: LEFT HAND - COMPLETE 3+ VIEW COMPARISON:  None. FINDINGS: There is no evidence of fracture or dislocation. There is no evidence of arthropathy or other focal bone abnormality. Soft tissues are unremarkable. IMPRESSION: Negative. Electronically Signed   By: Marin Olp  M.D.   On: 09/18/2018 17:09     ____________________________________________   PROCEDURES  Procedure(s) performed: None Procedures Critical Care performed:  None ____________________________________________   INITIAL IMPRESSION / ASSESSMENT AND PLAN / ED COURSE  48 y.o. female with a history of hypertension and hyperlipidemia who presents for evaluation of left hand swelling progressively worsened since she was seen by [place] yesterday after being bitten by her daughter's cat.  Vaccines are all up-to-date.  Her tetanus was renewed yesterday.  Patient does have progressively worsening erythema which is now extended to the forearm.  There are no signs of sepsis.  Patient has normal vital signs with a mildly elevated white count of 12.5.  Will give a dose of IV clindamycin and Unasyn.  We will continue to monitor for couple of hours in the emergency room.  If infection is not spreading patient will be discharged home continuing Augmentin plus clindamycin.  Otherwise will admit to the hospitalist.    _________________________ 4:38 PM on  09/19/2018 -----------------------------------------  Swelling and redness improved. Will dc home on augmentin and clindamycin. Discussed strict return precautions and close f/u for re-evaluation   As part of my medical decision making, I reviewed the following data within the [facility] notes reviewed and incorporated, Labs reviewed , Old chart reviewed, Radiograph reviewed , Notes from prior ED visits and [place] Controlled Substance Database    Pertinent labs & imaging results that were available during my care of the patient were reviewed by me and considered in my medical decision making (see chart for details).    ____________________________________________   FINAL CLINICAL IMPRESSION(S) / ED DIAGNOSES  Final diagnoses:  Cellulitis of left upper extremity  Cat bite of left hand, subsequent encounter      NEW MEDICATIONS STARTED DURING THIS VISIT:  ED Discharge Orders         Ordered    clindamycin (CLEOCIN) 300 MG capsule  3 times daily     09/19/18 1637           Note:  This document was prepared using Dragon voice recognition software and may include unintentional dictation errors.    Alfred Levins, [place], MD 09/19/18 [phone]

## 2018-09-19 NOTE — ED Notes (Signed)
NAD noted at time of D/C. Pt denies questions or concerns. Pt ambulatory to the lobby at this time.  

## 2018-09-19 NOTE — ED Notes (Signed)
Pt presents to ED with c/o hand swelling. Pt states was seen yesterday and prescribed Amoxicillin. Pt states cat bite to R hand with 2 puncture wounds, redness and swelling to medial hand at this time with purulent drainage per patient.

## 2018-09-19 NOTE — ED Triage Notes (Addendum)
Bitten by daughters cat yesterday on left hand.  Cat is up to date on all shots. Arrives today with left hand redness and swelling.  Seen through ED yesterday for same.  Treated and given amoxicillin.  Today, swelling worse and redness worse.

## 2018-09-20 ENCOUNTER — Encounter: Payer: Self-pay | Admitting: Internal Medicine

## 2018-09-20 DIAGNOSIS — K219 Gastro-esophageal reflux disease without esophagitis: Secondary | ICD-10-CM | POA: Insufficient documentation

## 2018-09-20 NOTE — Assessment & Plan Note (Signed)
Discussed calculated cholesterol risk.  Discussed low cholesterol diet and exercise.  Follow lipid panel and liver function tests.  Start crestor.

## 2018-09-20 NOTE — Assessment & Plan Note (Signed)
Question of acid reflux.  Start pepcid.

## 2018-09-20 NOTE — Assessment & Plan Note (Signed)
Blood pressure under good control.  Continue same medication regimen.  Follow pressures.  Follow metabolic panel.   

## 2018-09-20 NOTE — Assessment & Plan Note (Signed)
Discussed diet and exercise.  Follow.  

## 2018-09-20 NOTE — Assessment & Plan Note (Signed)
Discussed increased stress.  Overall she feels she is handling things relatively well.  Follow.

## 2018-09-20 NOTE — Assessment & Plan Note (Signed)
Physical today 09/17/18.  PAP 03/01/17 - negative with negative HPV.  Mammogram 09/17/17 - Birads I.  Schedule f/u mammogram.

## 2018-09-20 NOTE — Assessment & Plan Note (Signed)
Low carb diet and exercise.  Follow met b and a1c.   

## 2018-09-22 ENCOUNTER — Telehealth: Payer: Self-pay

## 2018-09-22 NOTE — Telephone Encounter (Signed)
Pt states she has been to the ED 2 x with this cat bite and this is a really bad cat bite. They were not even going to let her go home.  Pt states she really needs someone to look at it today, as it is still swollen. The dr at the ED advised her to have someone look at it today. Pt states it does not need to be Dr Nicki Reaper, anyone will do. If pt cannot be worked in, she is going to have to go to walk in clinic. Pt would like a call back.  Dr Nicki Reaper is not in the office today.

## 2018-09-22 NOTE — Telephone Encounter (Signed)
Patient is going to go to the walk in. Advised pt that Dr. Nicki Reaper and I are both out of the office today. Pt stated that she did not mind going to walk in clinic to have it looked at.

## 2018-09-22 NOTE — Telephone Encounter (Signed)
ED is recommending follow up with in two days. Since she was just seen, if doing okay, are you okay with waiting until abx complete to see her?

## 2018-09-22 NOTE — Telephone Encounter (Signed)
This was routed to me.  Please let pt know that I am not in the office today.  Needs to be seen today per note.  I routed to both of you

## 2018-09-22 NOTE — Telephone Encounter (Signed)
Copied from [place] [id number]. Topic: General - Other >> Sep 22, 2018  8:51 AM Judyann Munson wrote: Patient was seen in the ed on 09-19-2018 for a cat bite, she is calling to get schedule with Dr. Nicki Reaper  for a follow up within 2 days. Please advise

## 2018-09-29 ENCOUNTER — Ambulatory Visit
Admission: RE | Admit: 2018-09-29 | Discharge: 2018-09-29 | Disposition: A | Discharge status: Home / Self Care | Payer: Managed Care, Other (non HMO) | Source: Ambulatory Visit | Attending: Internal Medicine | Admitting: Internal Medicine

## 2018-09-29 DIAGNOSIS — Z1231 Encounter for screening mammogram for malignant neoplasm of breast: Secondary | ICD-10-CM | POA: Diagnosis not present

## 2018-10-01 ENCOUNTER — Encounter: Payer: Self-pay | Admitting: Internal Medicine

## 2018-10-01 NOTE — Telephone Encounter (Signed)
If having increase bloating, etc - needs to be evaluated.  If she wants to hold the cholesterol medication until all of this clears - ok.  I do not think the cholesterol medication is the cause.  Should be off abx now?  Again, if increased pain and bloating, needs to be evaluated.

## 2018-10-01 NOTE — Telephone Encounter (Signed)
Sent to PCP to advise 

## 2018-10-01 NOTE — Telephone Encounter (Signed)
Scheduled with lauren tomorrow. Patient says that she is off the abx and will continue the cholesterol medication

## 2018-10-02 ENCOUNTER — Encounter: Payer: Self-pay | Admitting: Family Medicine

## 2018-10-02 ENCOUNTER — Ambulatory Visit: Payer: Managed Care, Other (non HMO) | Admitting: Family Medicine

## 2018-10-02 VITALS — BP 110/80 | HR 73 | Temp 98.4°F | Resp 16 | Ht 62.0 in | Wt 237.6 lb

## 2018-10-02 DIAGNOSIS — K219 Gastro-esophageal reflux disease without esophagitis: Secondary | ICD-10-CM

## 2018-10-02 DIAGNOSIS — R140 Abdominal distension (gaseous): Secondary | ICD-10-CM | POA: Diagnosis not present

## 2018-10-02 DIAGNOSIS — R1013 Epigastric pain: Secondary | ICD-10-CM

## 2018-10-02 MED ORDER — FAMOTIDINE 40 MG PO TABS: 40.0000 mg | ORAL_TABLET | Freq: Every day | ORAL | 1 refills | Status: DC

## 2018-10-02 NOTE — Patient Instructions (Signed)
Use pepcid 40 mg daily for 4-8 weeks  After that drop down to 20 mg daily

## 2018-10-02 NOTE — Progress Notes (Signed)
Subjective:    [name], female    DOB: [birth year], 48 y.o.   MRN: [id number]  HPI   Patient presents to clinic complaining of abdominal pain, abdominal bloating and epigastric tenderness that began about a week ago.  Patient was treated with course of Augmentin and clindamycin due to cellulitis infection beginning on 09/16/2018.  Patient states taking both antibiotics was difficult on the stomach.  She has finished antibiotic course.  Patient also has begun taking a probiotic due to her recent antibiotic use.  Patient also recently started Crestor for hyperlipidemia, wonders if abdominal pain is related.  Upon further investigation in talking with patient, patient reports she was instructed to begin Pepcid daily to treat acid reflux, however did not do this because she was taking the antibiotics and was not sure if she could take Pepcid on top of everything.   Denies vomiting or diarrhea.  Denies fever or chills.  Patient Active Problem List   Diagnosis Date Noted  . Acid reflux 09/20/2018  . Stress 01/05/2018  . Right lower quadrant abdominal pain   . Basal cell carcinoma 09/01/2017  . Left tennis elbow 08/21/2015  . Facial lesion 04/09/2015  . Rib pain 04/09/2015  . Health care maintenance 04/09/2015  . SOB (shortness of breath) 01/24/2014  . BMI 40.0-44.9, adult ([place]) 01/24/2014  . Irregular menstrual cycle 09/20/2013  . Hyperglycemia 04/27/2013  . Hematuria 06/02/2012  . Hypercholesterolemia 06/02/2012  . Renal cyst 06/02/2012  . Hypertension 05/26/2012   Social History   Tobacco Use  . Smoking status: Never Smoker  . Smokeless tobacco: Never Used  Substance Use Topics  . Alcohol use: No    Alcohol/week: 0.0 standard drinks   Review of Systems  Constitutional: Negative for chills, fatigue and fever.  HENT: Negative for congestion, ear pain, sinus pain and sore throat.   Eyes: Negative.   Respiratory: Negative for cough, shortness of breath and  wheezing.   Cardiovascular: Negative for chest pain, palpitations and leg swelling.  Gastrointestinal: +upper ABD pain, nausea. No diarrhea or vomiting.  Genitourinary: Negative for dysuria, frequency and urgency.  Musculoskeletal: Negative for arthralgias and myalgias.  Skin: Negative for color change, pallor and rash.  Neurological: Negative for syncope, light-headedness and headaches.  Psychiatric/Behavioral: The patient is not nervous/anxious.       Objective:   Physical Exam Vitals signs and nursing note reviewed.  Constitutional:      General: She is not in acute distress.    Appearance: She is not toxic-appearing.  HENT:     Head: Normocephalic and atraumatic.     Mouth/Throat:     Mouth: Mucous membranes are moist.  Eyes:     Extraocular Movements: Extraocular movements intact.     Pupils: Pupils are equal, round, and reactive to light.  Cardiovascular:     Rate and Rhythm: Normal rate and regular rhythm.  Pulmonary:     Effort: Pulmonary effort is normal. No respiratory distress.     Breath sounds: Normal breath sounds. No wheezing, rhonchi or rales.  Abdominal:     General: Bowel sounds are normal. There is no distension.     Palpations: Abdomen is soft.     Tenderness: There is abdominal tenderness (mainly epigastric. some RUQ and LUQ pain.) in the epigastric area.  Skin:    General: Skin is warm and dry.     Coloration: Skin is not jaundiced or pale.  Neurological:     Mental Status: She is  alert and oriented to person, place, and time.  Psychiatric:        Mood and Affect: Mood normal.        Behavior: Behavior normal.    Vitals:   10/02/18 0815  BP: 110/80  Pulse: 73  Resp: 16  Temp: 98.4 F (36.9 C)  SpO2: 98%      Assessment & Plan:   Abdominal bloating, epigastric pain, acid reflux-suspect patient's pain and bloating is related to acid reflux and gas as a byproduct from acid.  Patient advised to begin Pepcid 40 mg daily for the next 4 to 8 weeks,  then reduce to 20 mg daily after 4 to 8 weeks.  We will get blood work in clinic today including CBC, CMP, amylase and lipase to rule out any anemia, electrolyte abnormalities, liver/kidney issues.  Advised patient that I do not feel her pain is related to the Crestor and to continue taking this medication as prescribed.  Patient also advised to continue taking probiotic daily for at least the next 30 days to help replace good gut bacteria.  We will monitor how patient is doing with addition of Pepcid and we will see what her lab results show.  Discussed possibility of getting ultrasound, we will hold off on ultrasound for now and wait for lab results and see how patient responds to Pepcid.  Patient will otherwise keep regular scheduled follow-up with PCP as planned and return to clinic sooner if any issues arise.

## 2018-10-03 LAB — HEPATIC FUNCTION PANEL
ALBUMIN: 3.9 g/dL (ref 3.8–4.8)
ALT: 16 IU/L (ref 0–32)
AST: 10 IU/L (ref 0–40)
Alkaline Phosphatase: 86 IU/L (ref 39–117)
Bilirubin Total: 0.4 mg/dL (ref 0.0–1.2)
Bilirubin, Direct: 0.12 mg/dL (ref 0.00–0.40)
Total Protein: 6.3 g/dL (ref 6.0–8.5)

## 2018-10-03 LAB — CBC WITH DIFFERENTIAL/PLATELET
Basophils Absolute: 0.1 10*3/uL (ref 0.0–0.2)
Basos: 1 %
EOS (ABSOLUTE): 0.4 10*3/uL (ref 0.0–0.4)
Eos: 3 %
Hematocrit: 37.3 % (ref 34.0–46.6)
Hemoglobin: 13.3 g/dL (ref 11.1–15.9)
Immature Grans (Abs): 0.1 10*3/uL (ref 0.0–0.1)
Immature Granulocytes: 1 %
Lymphocytes Absolute: 2.4 10*3/uL (ref 0.7–3.1)
Lymphs: 18 %
MCH: 28.8 pg (ref 26.6–33.0)
MCHC: 35.7 g/dL (ref 31.5–35.7)
MCV: 81 fL (ref 79–97)
MONOCYTES: 8 %
Monocytes Absolute: 1 10*3/uL — ABNORMAL HIGH (ref 0.1–0.9)
Neutrophils Absolute: 9.1 10*3/uL — ABNORMAL HIGH (ref 1.4–7.0)
Neutrophils: 69 %
Platelets: 330 10*3/uL (ref 150–450)
RBC: 4.62 x10E6/uL (ref 3.77–5.28)
RDW: 12.8 % (ref 11.7–15.4)
WBC: 13.1 10*3/uL — AB (ref 3.4–10.8)

## 2018-10-03 LAB — BASIC METABOLIC PANEL
BUN/Creatinine Ratio: 18 (ref 9–23)
BUN: 14 mg/dL (ref 6–24)
CO2: 24 mmol/L (ref 20–29)
Calcium: 8.5 mg/dL — ABNORMAL LOW (ref 8.7–10.2)
Chloride: 105 mmol/L (ref 96–106)
Creatinine, Ser: 0.8 mg/dL (ref 0.57–1.00)
GFR calc Af Amer: 102 mL/min/{1.73_m2} (ref >59)
GFR calc non Af Amer: 88 mL/min/{1.73_m2} (ref >59)
Glucose: 146 mg/dL — ABNORMAL HIGH (ref 65–99)
Potassium: 5 mmol/L (ref 3.5–5.2)
Sodium: 139 mmol/L (ref 134–144)

## 2018-10-03 LAB — LIPASE: Lipase: 33 U/L (ref 14–72)

## 2018-10-03 LAB — AMYLASE: Amylase: 32 U/L (ref 31–110)

## 2018-10-05 ENCOUNTER — Encounter: Payer: Self-pay | Admitting: Internal Medicine

## 2018-10-06 ENCOUNTER — Telehealth: Payer: Self-pay

## 2018-10-06 ENCOUNTER — Encounter: Payer: Self-pay | Admitting: Family Medicine

## 2018-10-06 NOTE — Telephone Encounter (Signed)
Copied from [place] [id number]. Topic: General - Call Back - No Documentation >> Oct 06, 2018 11:31 AM Yvette Rack wrote: Reason for CRM: Pt returned call to office from a missed call she had. Pt requests call back.

## 2018-10-06 NOTE — Telephone Encounter (Signed)
Spoke with pt and informed her that her message was sent to Dr. Derrel Nip to advise and I woyld call her back once I know anything.  Pt understood.  Gae Bon, CMA

## 2018-10-08 MED ORDER — FLUCONAZOLE 150 MG PO TABS: 150.0000 mg | ORAL_TABLET | Freq: Every day | ORAL | 0 refills | Status: DC

## 2018-10-08 NOTE — Telephone Encounter (Signed)
Mychart message sent.

## 2018-10-08 NOTE — Telephone Encounter (Signed)
Fluconazole x 2 days,  Sent in to local pharmacy

## 2018-10-08 NOTE — Addendum Note (Signed)
Addended by: Crecencio Mc on: 10/08/2018 09:48 AM   Modules accepted: Orders

## 2018-10-08 NOTE — Telephone Encounter (Signed)
Pt sent my chart message on 10/05/18 about having a yeast infection due to being on recent abx. She is allergic to OTC medication. I do not see where anything has been sent in. Ok to send in [facility] for her?

## 2018-10-14 ENCOUNTER — Encounter: Payer: Self-pay | Admitting: Internal Medicine

## 2018-10-15 ENCOUNTER — Other Ambulatory Visit: Payer: Self-pay

## 2018-10-15 ENCOUNTER — Encounter: Payer: Self-pay | Admitting: Internal Medicine

## 2018-10-15 ENCOUNTER — Ambulatory Visit: Payer: Managed Care, Other (non HMO) | Admitting: Internal Medicine

## 2018-10-15 VITALS — BP 126/78 | HR 82 | Temp 98.0°F | Resp 16 | Wt 236.8 lb

## 2018-10-15 DIAGNOSIS — N3001 Acute cystitis with hematuria: Secondary | ICD-10-CM | POA: Diagnosis not present

## 2018-10-15 DIAGNOSIS — R300 Dysuria: Secondary | ICD-10-CM

## 2018-10-15 LAB — POCT URINALYSIS DIPSTICK
Bilirubin, UA: NEGATIVE
Glucose, UA: NEGATIVE
Ketones, UA: NEGATIVE
Leukocytes, UA: NEGATIVE
Nitrite, UA: NEGATIVE
Protein, UA: POSITIVE — AB
Spec Grav, UA: 1.025 (ref 1.010–1.025)
Urobilinogen, UA: 0.2 E.U./dL
pH, UA: 6 (ref 5.0–8.0)

## 2018-10-15 MED ORDER — NITROFURANTOIN MONOHYD MACRO 100 MG PO CAPS: 100.0000 mg | ORAL_CAPSULE | Freq: Two times a day (BID) | ORAL | 0 refills | Status: DC

## 2018-10-15 NOTE — Progress Notes (Signed)
[name], female   DOB: Jun 22, 1971, 49 y.o.   MRN: [id number]   Subjective:    [name], female    DOB: 21-Jul-1971, 48 y.o.   MRN: [id number]  HPI  Patient here as a work in with concerns regarding a possible urinary tract infection.  States symptoms started 4-5 days ago.  Noticed increased urinary odor and dysuria.  No abdominal pain.  No low back pain.  No fever.  Eating.  No nausea or vomiting.  No diarrhea.  Feels similar to previous urinary tract infection.     Past Medical History:  Diagnosis Date  . Abnormal pap    required freezing  . Hypercholesterolemia   . Hypertension   . Microscopic hematuria    s/p renal biopsy  . Thin basement membrane disease    Past Surgical History:  Procedure Laterality Date  . TONSILLECTOMY    . TUBAL LIGATION     Family History  Problem Relation Age of Onset  . Colon cancer Mother        died - age 63  . Hypertension Father   . Colon cancer Maternal Grandfather   . Breast cancer Maternal Aunt 60  . Breast cancer Paternal Aunt 84   Social History   Socioeconomic History  . Marital status: Married    Spouse name: Not on file  . Number of children: 2  . Years of education: Not on file  . Highest education level: Not on file  Occupational History    Employer: [employer]  . Financial resource strain: Not on file  . Food insecurity:    Worry: Not on file    Inability: Not on file  . Transportation needs:    Medical: Not on file    Non-medical: Not on file  Tobacco Use  . Smoking status: Never Smoker  . Smokeless tobacco: Never Used  Substance and Sexual Activity  . Alcohol use: No    Alcohol/week: 0.0 standard drinks  . Drug use: No  . Sexual activity: Yes    Birth control/protection: Surgical  Lifestyle  . Physical activity:    Days per week: Not on file    Minutes per session: Not on file  . Stress: Not on file  Relationships  . Social connections:    Talks on phone: Not  on file    Gets together: Not on file    Attends religious service: Not on file    Active member of club or organization: Not on file    Attends meetings of clubs or organizations: Not on file    Relationship status: Not on file  Other Topics Concern  . Not on file  Social History Narrative  . Not on file    Outpatient Encounter Medications as of 10/15/2018  Medication Sig  . famotidine (PEPCID) 40 MG tablet Take 1 tablet (40 mg total) by mouth at bedtime.  . hydrochlorothiazide (MICROZIDE) 12.5 MG capsule TAKE 1 CAPSULE BY MOUTH  DAILY  . lisinopril (PRINIVIL,ZESTRIL) 40 MG tablet Take 1 tablet (40 mg total) by mouth daily.  . nitrofurantoin, macrocrystal-monohydrate, (MACROBID) 100 MG capsule Take 1 capsule (100 mg total) by mouth 2 (two) times daily.  . rosuvastatin (CRESTOR) 10 MG tablet Take 1 tablet (10 mg total) by mouth daily.  . [DISCONTINUED] amoxicillin-clavulanate (AUGMENTIN) 875-125 MG tablet Take 1 tablet by mouth 2 (two) times daily.  . [DISCONTINUED] fluconazole (DIFLUCAN) 150 MG tablet Take 1 tablet (150  mg total) by mouth daily.  . [DISCONTINUED] naproxen (NAPROSYN) 500 MG tablet Take 1 tablet (500 mg total) by mouth 2 (two) times daily with a meal.  . [DISCONTINUED] ondansetron (ZOFRAN ODT) 4 MG disintegrating tablet Take 1 tablet (4 mg total) by mouth every 8 (eight) hours as needed.   No facility-administered encounter medications on file as of 10/15/2018.     Review of Systems  Constitutional: Negative for appetite change and fever.  Gastrointestinal: Negative for abdominal pain, diarrhea, nausea and vomiting.  Genitourinary: Positive for dysuria. Negative for vaginal discharge and vaginal pain.       Urinary odor.    Musculoskeletal: Negative for myalgias.  Skin: Negative for color change and rash.  Psychiatric/Behavioral: Negative for agitation and decreased concentration.       Objective:    Physical Exam Constitutional:      General: She is not in  acute distress.    Appearance: Normal appearance.  Neck:     Thyroid: No thyromegaly.  Cardiovascular:     Rate and Rhythm: Normal rate and regular rhythm.  Pulmonary:     Effort: No respiratory distress.     Breath sounds: Normal breath sounds. No wheezing.  Abdominal:     General: Bowel sounds are normal.     Palpations: Abdomen is soft.     Tenderness: There is no abdominal tenderness.  Musculoskeletal:     Comments: No CVA tenderness.    Skin:    Findings: No erythema or rash.  Neurological:     Mental Status: She is alert.  Psychiatric:        Mood and Affect: Mood normal.        Behavior: Behavior normal.     BP 126/78   Pulse 82   Temp 98 F (36.7 C) (Oral)   Resp 16   Wt 236 lb 12.8 oz (107.4 kg)   SpO2 97%   BMI 43.31 kg/m  Wt Readings from Last 3 Encounters:  10/15/18 236 lb 12.8 oz (107.4 kg)  10/02/18 237 lb 9.6 oz (107.8 kg)  09/19/18 236 lb (107 kg)     Lab Results  Component Value Date   WBC 13.1 (H) 10/02/2018   HGB 13.3 10/02/2018   HCT 37.3 10/02/2018   PLT 330 10/02/2018   GLUCOSE 146 (H) 10/02/2018   CHOL 223 (H) 09/15/2018   TRIG 252 (H) 09/15/2018   HDL 44 09/15/2018   LDLCALC 129 (H) 09/15/2018   ALT 16 10/02/2018   AST 10 10/02/2018   NA 139 10/02/2018   K 5.0 10/02/2018   CL 105 10/02/2018   CREATININE 0.80 10/02/2018   BUN 14 10/02/2018   CO2 24 10/02/2018   TSH 2.620 09/15/2018   HGBA1C 6.5 (H) 09/15/2018    Mm 3d Screen Breast Bilateral  Result Date: 09/29/2018 CLINICAL DATA:  Screening. EXAM: DIGITAL SCREENING BILATERAL MAMMOGRAM WITH TOMO AND CAD COMPARISON:  Previous exam(s). ACR Breast Density Category b: There are scattered areas of fibroglandular density. FINDINGS: There are no findings suspicious for malignancy. Images were processed with CAD. IMPRESSION: No mammographic evidence of malignancy. A result letter of this screening mammogram will be mailed directly to the patient. RECOMMENDATION: Screening mammogram in  one year. (Code:SM-B-01Y) BI-RADS CATEGORY  1: Negative. Electronically Signed   By: Fidela Salisbury M.D.   On: 09/29/2018 12:53       Assessment & Plan:   Problem List Items Addressed This Visit    None    Visit Diagnoses  Dysuria    -  Primary   Relevant Orders   POCT urinalysis dipstick (Completed)   Urine Culture (Completed)   Urine Microscopic (Completed)   Acute cystitis with hematuria       Symptoms c/w uti.  No vaginal symptoms.  Urine dip with blood.  Send culture.  Treat with macrobid.         Einar Pheasant, MD

## 2018-10-16 ENCOUNTER — Encounter: Payer: Self-pay | Admitting: Internal Medicine

## 2018-10-16 LAB — URINALYSIS, MICROSCOPIC ONLY
Casts: NONE SEEN /lpf
RBC, UA: >30 /hpf — AB (ref 0–2)
WBC, UA: >30 /hpf — AB (ref 0–5)

## 2018-10-17 LAB — URINE CULTURE

## 2018-10-18 ENCOUNTER — Encounter: Payer: Self-pay | Admitting: Internal Medicine

## 2018-11-20 ENCOUNTER — Other Ambulatory Visit: Payer: Self-pay

## 2018-11-20 ENCOUNTER — Encounter: Payer: Self-pay | Admitting: Internal Medicine

## 2018-11-20 ENCOUNTER — Ambulatory Visit: Payer: Managed Care, Other (non HMO) | Admitting: Internal Medicine

## 2018-11-20 ENCOUNTER — Ambulatory Visit (INDEPENDENT_AMBULATORY_CARE_PROVIDER_SITE_OTHER): Payer: Managed Care, Other (non HMO) | Admitting: Internal Medicine

## 2018-11-20 DIAGNOSIS — I10 Essential (primary) hypertension: Secondary | ICD-10-CM

## 2018-11-20 DIAGNOSIS — R739 Hyperglycemia, unspecified: Secondary | ICD-10-CM | POA: Diagnosis not present

## 2018-11-20 DIAGNOSIS — H938X2 Other specified disorders of left ear: Secondary | ICD-10-CM

## 2018-11-20 DIAGNOSIS — E7800 Pure hypercholesterolemia, unspecified: Secondary | ICD-10-CM | POA: Diagnosis not present

## 2018-11-20 DIAGNOSIS — F439 Reaction to severe stress, unspecified: Secondary | ICD-10-CM

## 2018-11-20 NOTE — Progress Notes (Addendum)
[name], female   DOB: 02/15/1971, 48 y.o.   MRN: [id number] Virtual Visit via Video Note  This visit type was conducted due to national recommendations for restrictions regarding the COVID-19 pandemic (e.g. social distancing).  This format is felt to be most appropriate for this patient at this time.  All issues noted in this document were discussed and addressed.  No physical exam was performed (except for noted visual exam findings with Video Visits).   I connected with Kathleen Espinoza by a video enabled telemedicine application and verified that I am speaking with the correct person using two identifiers. Location patient: car at her work Location provider: work Persons participating in the virtual visit: patient, provider  I discussed the limitations, risks, security and privacy concerns of performing an evaluation and management service by video and the availability of in person appointments. The patient expressed understanding and agreed to proceed.   Reason for visit: scheduled follow up.   HPI: She reports increased stress.  They have moved out of their house.  Living with her daughter until their house is complete.  Discussed with her today.  Overall she feels she is handling things relatively well.  Is trying to stay in as much as possible.  Is still working.  Wearing mask.  No fever. No cough, congestion or sob.  No acid reflux.  No abdominal pain.  No chest pain.  Previous cat bite.  No residual problems.  On crestor.  Tolerating.  Discussed low carb diet and exercise.  Did notice some fullness in her left ear.  Discussed taking antihistamine and using nasal spray.  No hearing change.  No pain.  No significant sinus issues.  Some minimal headache.  Tylenol helped.  No persistent headache.     ROS: See pertinent positives and negatives per HPI.  Past Medical History:  Diagnosis Date  . Abnormal pap    required freezing  . Hypercholesterolemia   . Hypertension   .  Microscopic hematuria    s/p renal biopsy  . Thin basement membrane disease     Past Surgical History:  Procedure Laterality Date  . TONSILLECTOMY    . TUBAL LIGATION      Family History  Problem Relation Age of Onset  . Colon cancer Mother        died - age 32  . Hypertension Father   . Colon cancer Maternal Grandfather   . Breast cancer Maternal Aunt 60  . Breast cancer Paternal Aunt 54    SOCIAL HX: reviewed.    Current Outpatient Medications:  .  famotidine (PEPCID) 40 MG tablet, Take 1 tablet (40 mg total) by mouth at bedtime., Disp: 30 tablet, Rfl: 1 .  hydrochlorothiazide (MICROZIDE) 12.5 MG capsule, TAKE 1 CAPSULE BY MOUTH  DAILY, Disp: 90 capsule, Rfl: 1 .  lisinopril (PRINIVIL,ZESTRIL) 40 MG tablet, Take 1 tablet (40 mg total) by mouth daily., Disp: 90 tablet, Rfl: 1 .  rosuvastatin (CRESTOR) 10 MG tablet, Take 1 tablet (10 mg total) by mouth daily., Disp: 30 tablet, Rfl: 2  EXAM:  GENERAL: alert, oriented, appears well and in no acute distress  HEENT: atraumatic, conjunttiva clear, no obvious abnormalities on inspection of external nose and ears  NECK: normal movements of the head and neck  LUNGS: on inspection no signs of respiratory distress, breathing rate appears normal, no obvious gross SOB, gasping or wheezing  CV: no obvious cyanosis  PSYCH/NEURO: pleasant and cooperative, no obvious depression or anxiety, speech  and thought processing grossly intact  ASSESSMENT AND PLAN:  Discussed the following assessment and plan:  Ear fullness, left - No pain.  Trial of antihistamine.  Steroid nasal spray.  Follow.  Call with update.   Hypercholesterolemia  Hyperglycemia  Essential hypertension  Stress  Hypercholesterolemia On crestor.  Tolerating.  Follow lipid panel and liver function tests.    Hyperglycemia Low carb diet and exercise.  Follow met b and a1c.    Hypertension Have her spot check her pressure.  Send in readings.  Same medication.   Follow pressures.  Follow metabolic panel.    Stress Discussed increased stress.  She does not feel needs anything more at this time.  Follow.      I discussed the assessment and treatment plan with the patient. The patient was provided an opportunity to ask questions and all were answered. The patient agreed with the plan and demonstrated an understanding of the instructions.   The patient was advised to call back or seek an in-person evaluation if the symptoms worsen or if the condition fails to improve as anticipated.    Einar Pheasant, MD

## 2018-11-23 ENCOUNTER — Encounter: Payer: Self-pay | Admitting: Internal Medicine

## 2018-11-23 NOTE — Assessment & Plan Note (Signed)
On crestor.  Tolerating.  Follow lipid panel and liver function tests.

## 2018-11-23 NOTE — Assessment & Plan Note (Signed)
Low carb diet and exercise.  Follow met b and a1c.   

## 2018-11-23 NOTE — Assessment & Plan Note (Signed)
Have her spot check her pressure.  Send in readings.  Same medication.  Follow pressures.  Follow metabolic panel.

## 2018-11-23 NOTE — Assessment & Plan Note (Signed)
Discussed increased stress.  She does not feel needs anything more at this time.  Follow.

## 2018-11-27 ENCOUNTER — Encounter: Payer: Self-pay | Admitting: Internal Medicine

## 2018-11-27 ENCOUNTER — Other Ambulatory Visit: Payer: Self-pay

## 2018-11-27 MED ORDER — ROSUVASTATIN CALCIUM 10 MG PO TABS: 10.0000 mg | ORAL_TABLET | Freq: Every day | ORAL | 2 refills | Status: DC

## 2018-12-03 ENCOUNTER — Encounter: Payer: Self-pay | Admitting: Internal Medicine

## 2018-12-03 DIAGNOSIS — H938X2 Other specified disorders of left ear: Secondary | ICD-10-CM

## 2018-12-04 NOTE — Telephone Encounter (Signed)
They are seeing patients. I let them know that you would be ordering referral

## 2018-12-04 NOTE — Telephone Encounter (Signed)
Please call pt and see if she is agreeable to referral to ENT since what we have done is not helping. If so, let me know and I will place the order for the referral.

## 2018-12-04 NOTE — Telephone Encounter (Signed)
Please call and see if ENT is seeing anyone in the office.  She is not responding to our treatment.  I can place order for referral.

## 2018-12-04 NOTE — Telephone Encounter (Signed)
LMTCB

## 2018-12-05 NOTE — Telephone Encounter (Signed)
Order for ENT referral placed.

## 2018-12-05 NOTE — Telephone Encounter (Signed)
Pt is agreeable to ENT referral. Advised that you would place order and they will contact her to set up appt

## 2018-12-12 NOTE — Telephone Encounter (Signed)
error 

## 2018-12-18 ENCOUNTER — Telehealth: Payer: Self-pay | Admitting: Otolaryngology

## 2018-12-18 ENCOUNTER — Other Ambulatory Visit: Payer: Managed Care, Other (non HMO)

## 2018-12-18 DIAGNOSIS — Z20822 Contact with and (suspected) exposure to covid-19: Secondary | ICD-10-CM

## 2018-12-18 NOTE — Telephone Encounter (Signed)
Dr Carmin Richmond  Of [facility].called to have patient scheduled for COVID-19 testing. Pt was with the doctor No outbound call necessary. Office Number [phone]

## 2018-12-21 LAB — NOVEL CORONAVIRUS, NAA: SARS-CoV-2, NAA: NOT DETECTED

## 2018-12-25 ENCOUNTER — Encounter: Payer: Self-pay | Admitting: *Deleted

## 2019-01-29 ENCOUNTER — Ambulatory Visit (INDEPENDENT_AMBULATORY_CARE_PROVIDER_SITE_OTHER): Payer: Managed Care, Other (non HMO) | Admitting: Internal Medicine

## 2019-01-29 ENCOUNTER — Other Ambulatory Visit: Payer: Self-pay

## 2019-01-29 DIAGNOSIS — R739 Hyperglycemia, unspecified: Secondary | ICD-10-CM | POA: Diagnosis not present

## 2019-01-29 DIAGNOSIS — I10 Essential (primary) hypertension: Secondary | ICD-10-CM | POA: Diagnosis not present

## 2019-01-29 DIAGNOSIS — E7800 Pure hypercholesterolemia, unspecified: Secondary | ICD-10-CM

## 2019-01-29 DIAGNOSIS — K219 Gastro-esophageal reflux disease without esophagitis: Secondary | ICD-10-CM

## 2019-01-29 DIAGNOSIS — F439 Reaction to severe stress, unspecified: Secondary | ICD-10-CM

## 2019-01-29 NOTE — Progress Notes (Signed)
[name], female   DOB: Dec 04, 1970, 48 y.o.   MRN: [id number]   Virtual Visit via telephone Note  This visit type was conducted due to national recommendations for restrictions regarding the COVID-19 pandemic (e.g. social distancing).  This format is felt to be most appropriate for this patient at this time.  All issues noted in this document were discussed and addressed.  No physical exam was performed (except for noted visual exam findings with Video Visits).   I connected with Fredderick Severance by telephone and verified that I am speaking with the correct person using two identifiers. Location patient: home Location provider: work  Persons participating in the telephone visit: patient, provider  I discussed the limitations, risks, security and privacy concerns of performing an evaluation and management service by telephone and the availability of in person appointments. The patient expressed understanding and agreed to proceed.   Reason for visit: scheduled follow up.   HPI: She reports she is doing relatively well.  Working.  Increased hours.  Still waiting to move into her house.  Living with her daughter.  Increased stress with the above, but overall she feels she is handling things relatively well.  No chest pain. No sob.  No abdominal pain.  Bowels moving.   Tolerating crestor.  Has not been checking her blood pressure.  Discussed importance of spot checking her pressure.  Discussed using nasal spray for allergy symptoms.  Discussed omeprazole.     ROS: See pertinent positives and negatives per HPI.  Past Medical History:  Diagnosis Date  . Abnormal pap    required freezing  . Hypercholesterolemia   . Hypertension   . Microscopic hematuria    s/p renal biopsy  . Thin basement membrane disease     Past Surgical History:  Procedure Laterality Date  . TONSILLECTOMY    . TUBAL LIGATION      Family History  Problem Relation Age of Onset  . Colon cancer  Mother        died - age 23  . Hypertension Father   . Colon cancer Maternal Grandfather   . Breast cancer Maternal Aunt 60  . Breast cancer Paternal Aunt 27    SOCIAL HX: reviewed.    Current Outpatient Medications:  .  hydrochlorothiazide (MICROZIDE) 12.5 MG capsule, TAKE 1 CAPSULE BY MOUTH  DAILY, Disp: 90 capsule, Rfl: 1 .  lisinopril (PRINIVIL,ZESTRIL) 40 MG tablet, Take 1 tablet (40 mg total) by mouth daily., Disp: 90 tablet, Rfl: 1 .  omeprazole (PRILOSEC) 20 MG capsule, Take 1 capsule (20 mg total) by mouth daily., Disp: 30 capsule, Rfl: 2 .  rosuvastatin (CRESTOR) 10 MG tablet, Take 1 tablet (10 mg total) by mouth daily., Disp: 30 tablet, Rfl: 2  EXAM:  GENERAL: alert.  Sounds to be in no acute distress.  Answering questions appropriately.    PSYCH/NEURO: pleasant and cooperative, no obvious depression or anxiety, speech and thought processing grossly intact  ASSESSMENT AND PLAN:  Discussed the following assessment and plan:  Acid reflux Omeprazole.  Follow.    Hypercholesterolemia On crestor.  Low cholesterol diet and exercise.  Follow lipid panel and liver function tests.    Hyperglycemia Discussed diet and exercise.  Low carb diet.  Follow met b and a1c.    Hypertension Have her spot check her pressure.  Follow pressures.  Follow metabolic panel.    Stress Increased stress as outlined.  Discussed with her today.  She does not feel needs any  further intervention.  Follow.      I discussed the assessment and treatment plan with the patient. The patient was provided an opportunity to ask questions and all were answered. The patient agreed with the plan and demonstrated an understanding of the instructions.   The patient was advised to call back or seek an in-person evaluation if the symptoms worsen or if the condition fails to improve as anticipated.  I provided 15 minutes of non-face-to-face time during this encounter.   Einar Pheasant, MD

## 2019-02-01 ENCOUNTER — Encounter: Payer: Self-pay | Admitting: Internal Medicine

## 2019-02-01 MED ORDER — OMEPRAZOLE 20 MG PO CPDR: 20.0000 mg | DELAYED_RELEASE_CAPSULE | Freq: Every day | ORAL | 2 refills | Status: AC

## 2019-02-01 NOTE — Assessment & Plan Note (Signed)
Increased stress as outlined.  Discussed with her today.  She does not feel needs any further intervention.  Follow.   

## 2019-02-01 NOTE — Assessment & Plan Note (Signed)
Discussed diet and exercise.  Low carb diet.  Follow met b and a1c.   

## 2019-02-01 NOTE — Assessment & Plan Note (Signed)
Have her spot check her pressure.  Follow pressures.  Follow metabolic panel.

## 2019-02-01 NOTE — Assessment & Plan Note (Signed)
Omeprazole.  Follow.

## 2019-02-01 NOTE — Assessment & Plan Note (Signed)
On crestor.  Low cholesterol diet and exercise.  Follow lipid panel and liver function tests.   

## 2019-02-05 ENCOUNTER — Other Ambulatory Visit: Payer: Self-pay | Admitting: Internal Medicine

## 2019-02-15 ENCOUNTER — Other Ambulatory Visit: Payer: Self-pay | Admitting: Internal Medicine

## 2019-03-09 ENCOUNTER — Encounter: Payer: Self-pay | Admitting: Internal Medicine

## 2019-03-10 NOTE — Telephone Encounter (Signed)
Pt given info for [facility].

## 2019-04-21 ENCOUNTER — Encounter: Payer: Self-pay | Admitting: Internal Medicine

## 2019-04-22 ENCOUNTER — Other Ambulatory Visit: Payer: Self-pay

## 2019-04-22 MED ORDER — ROSUVASTATIN CALCIUM 10 MG PO TABS: 10.0000 mg | ORAL_TABLET | Freq: Every day | ORAL | 2 refills | Status: DC

## 2019-06-12 ENCOUNTER — Ambulatory Visit: Payer: Managed Care, Other (non HMO) | Admitting: Internal Medicine

## 2019-07-03 ENCOUNTER — Ambulatory Visit: Payer: Managed Care, Other (non HMO) | Admitting: Internal Medicine

## 2019-07-21 ENCOUNTER — Other Ambulatory Visit: Payer: Self-pay | Admitting: Internal Medicine

## 2019-08-01 ENCOUNTER — Other Ambulatory Visit: Payer: Self-pay | Admitting: Internal Medicine

## 2019-08-11 ENCOUNTER — Other Ambulatory Visit: Payer: Self-pay

## 2019-08-11 ENCOUNTER — Encounter: Payer: Self-pay | Admitting: Internal Medicine

## 2019-08-11 MED ORDER — ROSUVASTATIN CALCIUM 10 MG PO TABS: 10.0000 mg | ORAL_TABLET | Freq: Every day | ORAL | 2 refills | Status: DC

## 2019-09-01 ENCOUNTER — Other Ambulatory Visit: Payer: Self-pay | Admitting: Internal Medicine

## 2019-11-12 ENCOUNTER — Other Ambulatory Visit: Payer: Self-pay | Admitting: Internal Medicine

## 2019-12-08 ENCOUNTER — Encounter: Payer: Self-pay | Admitting: Internal Medicine

## 2019-12-08 ENCOUNTER — Telehealth (INDEPENDENT_AMBULATORY_CARE_PROVIDER_SITE_OTHER): Payer: Managed Care, Other (non HMO) | Admitting: Internal Medicine

## 2019-12-08 VITALS — Ht 62.0 in | Wt 237.0 lb

## 2019-12-08 DIAGNOSIS — B3731 Acute candidiasis of vulva and vagina: Secondary | ICD-10-CM

## 2019-12-08 DIAGNOSIS — B373 Candidiasis of vulva and vagina: Secondary | ICD-10-CM | POA: Diagnosis not present

## 2019-12-08 DIAGNOSIS — J329 Chronic sinusitis, unspecified: Secondary | ICD-10-CM | POA: Diagnosis not present

## 2019-12-08 MED ORDER — SALINE SPRAY 0.65 % NA SOLN: 2.0000 | Freq: Every day | NASAL | 2 refills | Status: AC | PRN

## 2019-12-08 MED ORDER — FLUCONAZOLE 150 MG PO TABS: 150.0000 mg | ORAL_TABLET | Freq: Once | ORAL | 0 refills | Status: AC

## 2019-12-08 MED ORDER — AZITHROMYCIN 250 MG PO TABS: ORAL_TABLET | ORAL | 0 refills | Status: DC

## 2019-12-08 MED ORDER — LORATADINE 10 MG PO TABS: 10.0000 mg | ORAL_TABLET | Freq: Every day | ORAL | 3 refills | Status: AC | PRN

## 2019-12-08 MED ORDER — FLUTICASONE PROPIONATE 50 MCG/ACT NA SUSP: 2.0000 | Freq: Every day | NASAL | 2 refills | Status: AC | PRN

## 2019-12-08 NOTE — Progress Notes (Signed)
Virtual Visit via Video Note  I connected with Kathleen Espinoza  on 12/08/19 at 11:50 AM EDT by a video enabled telemedicine application and verified that I am speaking with the correct person using two identifiers.  Location patient: home Location provider:work or home office Persons participating in the virtual visit: patient, provider  I discussed the limitations of evaluation and management by telemedicine and the availability of in person appointments. The patient expressed understanding and agreed to proceed.   HPI: 1. 11/28/19 she had sinus pressure h/a, sinus pain, and cheek pain mild hurting her teeth covid test pcr 12/03/19 negative at [facility] husband also sick  Denies fever, she has had slight cough with some phelgm She tried dayquil, alkaseltzer plus and afrin w/o relief. She has been out of work all last week and these 2 days this week and boss does not want her back at work until she is better Denies h/o allergies/previous sinus issues  ROS: See pertinent positives and negatives per HPI.  Past Medical History:  Diagnosis Date  . Abnormal pap    required freezing  . Hypercholesterolemia   . Hypertension   . Microscopic hematuria    s/p renal biopsy  . Thin basement membrane disease     Past Surgical History:  Procedure Laterality Date  . TONSILLECTOMY    . TUBAL LIGATION      Family History  Problem Relation Age of Onset  . Colon cancer Mother        died - age 84  . Hypertension Father   . Colon cancer Maternal Grandfather   . Breast cancer Maternal Aunt 60  . Breast cancer Paternal Aunt 61    SOCIAL HX: married   Current Outpatient Medications:  .  hydrochlorothiazide (MICROZIDE) 12.5 MG capsule, TAKE 1 CAPSULE BY MOUTH  DAILY, Disp: 90 capsule, Rfl: 3 .  lisinopril (ZESTRIL) 40 MG tablet, TAKE 1 TABLET BY MOUTH  DAILY, Disp: 90 tablet, Rfl: 3 .  omeprazole (PRILOSEC) 20 MG capsule, Take 1 capsule (20 mg total) by mouth daily., Disp: 30 capsule, Rfl:  2 .  rosuvastatin (CRESTOR) 10 MG tablet, TAKE 1 TABLET(10 MG) BY MOUTH DAILY, Disp: 30 tablet, Rfl: 2 .  azithromycin (ZITHROMAX) 250 MG tablet, 2 pills day 1 and 1 pill day 2-5, Disp: 6 tablet, Rfl: 0 .  fluconazole (DIFLUCAN) 150 MG tablet, Take 1 tablet (150 mg total) by mouth once for 1 dose., Disp: 1 tablet, Rfl: 0 .  fluticasone (FLONASE) 50 MCG/ACT nasal spray, Place 2 sprays into both nostrils daily as needed for allergies or rhinitis. After nasal saline, Disp: 16 g, Rfl: 2 .  loratadine (CLARITIN) 10 MG tablet, Take 1 tablet (10 mg total) by mouth daily as needed for allergies., Disp: 90 tablet, Rfl: 3 .  sodium chloride (OCEAN) 0.65 % SOLN nasal spray, Place 2 sprays into both nostrils daily as needed for congestion., Disp: 30 mL, Rfl: 2  EXAM:  VITALS per patient if applicable:  GENERAL: alert, oriented, appears well and in no acute distress  HEENT: atraumatic, conjunttiva clear, no obvious abnormalities on inspection of external nose and ears  NECK: normal movements of the head and neck  LUNGS: on inspection no signs of respiratory distress, breathing rate appears normal, no obvious gross SOB, gasping or wheezing  CV: no obvious cyanosis  MS: moves all visible extremities without noticeable abnormality  PSYCH/NEURO: pleasant and cooperative, no obvious depression or anxiety, speech and thought processing grossly intact  ASSESSMENT AND PLAN:  Discussed the following assessment and plan:  Sinusitis, unspecified chronicity, unspecified location - Plan: sodium chloride (OCEAN) 0.65 % SOLN nasal spray, fluticasone (FLONASE) 50 MCG/ACT nasal spray, azithromycin (ZITHROMAX) 250 MG tablet, loratadine (CLARITIN) 10 MG tablet Note for work to return 12/14/19   Yeast vaginitis - Plan: fluconazole (DIFLUCAN) 150 MG tablet  -we discussed possible serious and likely etiologies, options for evaluation and workup, limitations of telemedicine visit vs in person visit, treatment,  treatment risks and precautions. Pt prefers to treat via telemedicine empirically rather then risking or undertaking an in person visit at this moment. Patient agrees to seek prompt in person care if worsening, new symptoms arise, or if is not improving with treatment.   I discussed the assessment and treatment plan with the patient. The patient was provided an opportunity to ask questions and all were answered. The patient agreed with the plan and demonstrated an understanding of the instructions.   The patient was advised to call back or seek an in-person evaluation if the symptoms worsen or if the condition fails to improve as anticipated.  Time 20 minutes  Delorise Jackson, MD

## 2020-02-14 IMAGING — CT CT ABD-PELV W/ CM
2 of 5 series · 16 of 46 positions shown, 18 images · IV contrast (APPLIED)
Comparison: Abdomen radiograph from 05/21/2017

CLINICAL DATA: Right lower quadrant pain x3 days

EXAM:
CT ABDOMEN AND PELVIS WITH CONTRAST
TECHNIQUE: Multidetector CT imaging of the abdomen and pelvis was performed
using the standard protocol following bolus administration of
intravenous contrast.
CONTRAST:  100mL [id number] IOPAMIDOL ([id number]) INJECTION 61%

[Series 2: routine abd/pel with · axial · 0.84mm/px · z∈[-492,-82]mm · 13 of 94 slices shown, 15 images]
[im 6/94  soft-tissue]
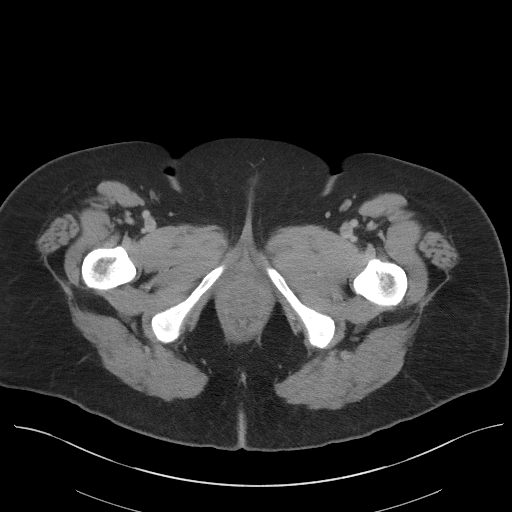
[im 6/94  bone]
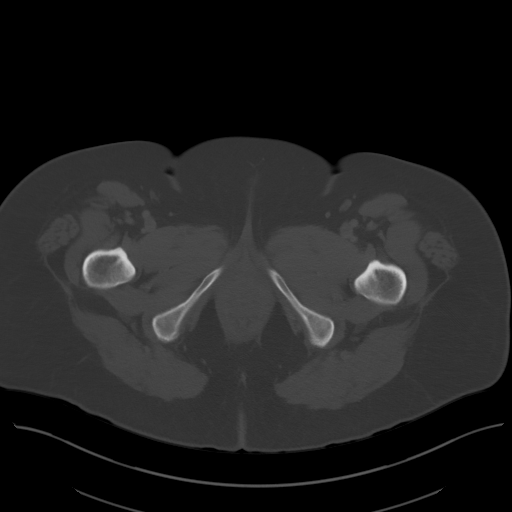
[im 11/94  soft-tissue]
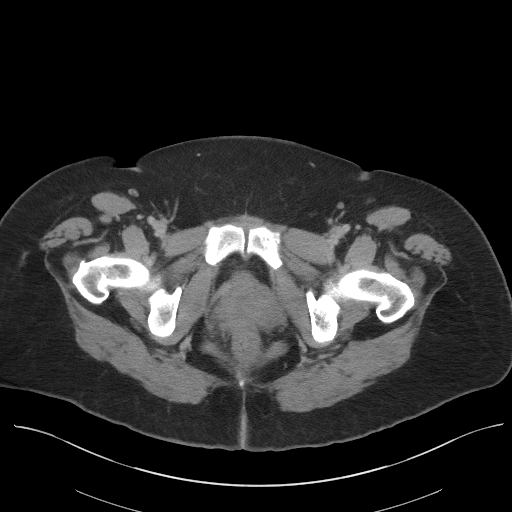
[im 21/94  soft-tissue]
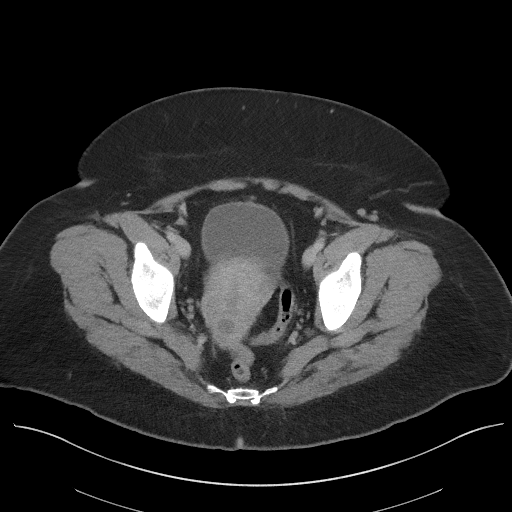
[im 26/94  soft-tissue]
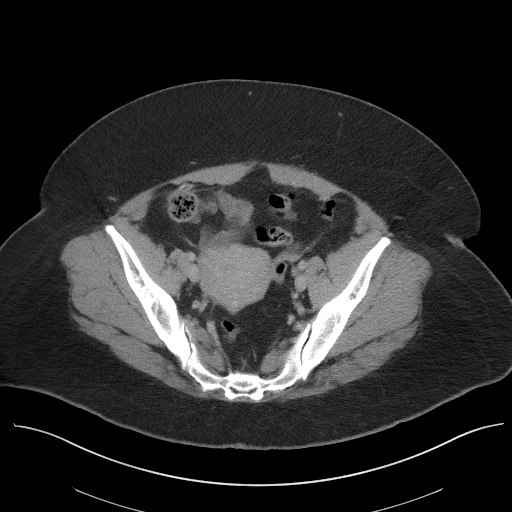
[im 32/94  soft-tissue]
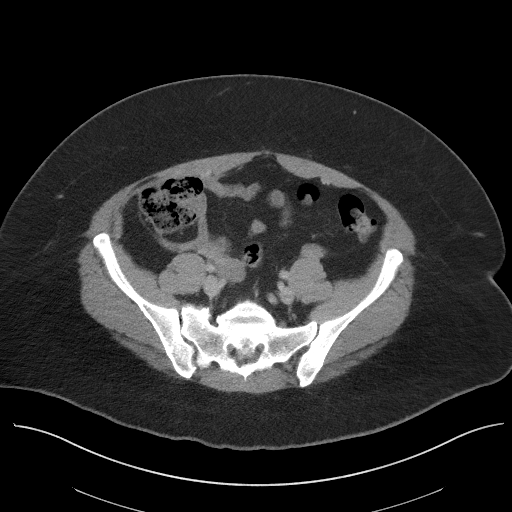
[im 42/94  soft-tissue]
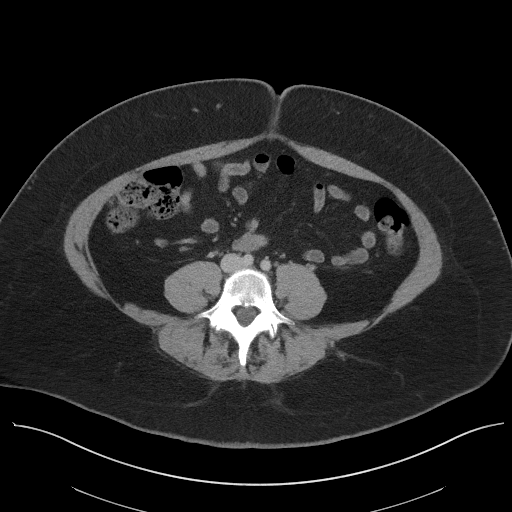
[im 47/94  soft-tissue]
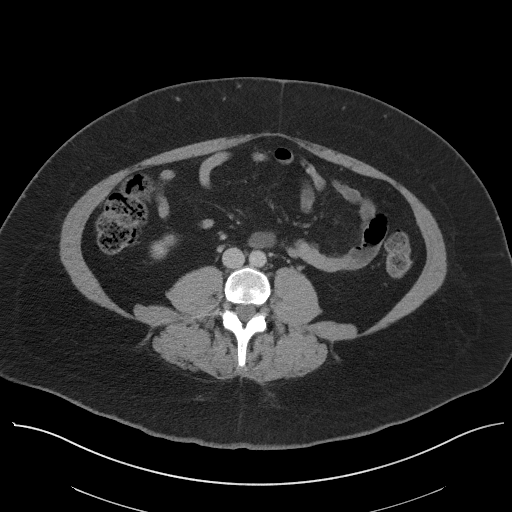
[im 52/94  soft-tissue]
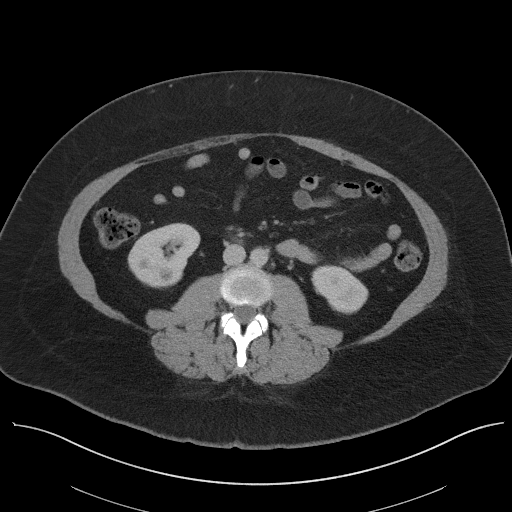
[im 63/94  soft-tissue]
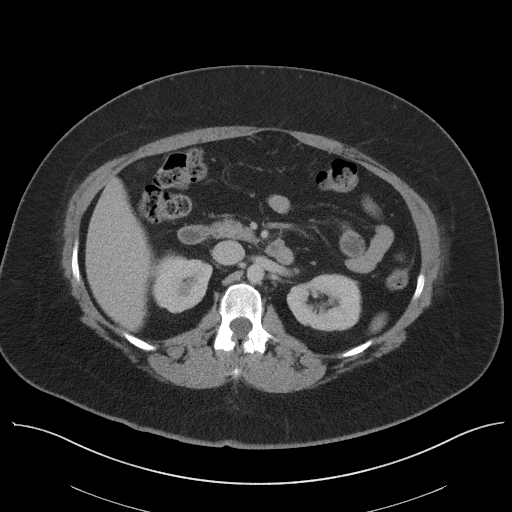
[im 63/94  bone]
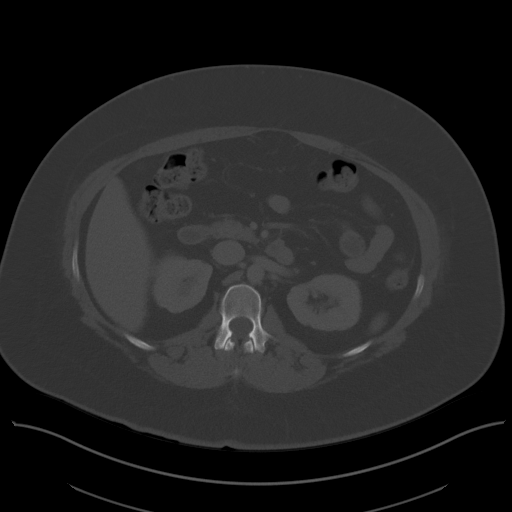
[im 68/94  soft-tissue]
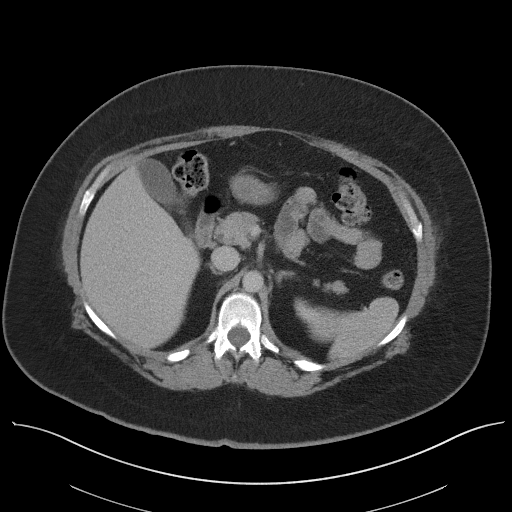
[im 73/94  soft-tissue]
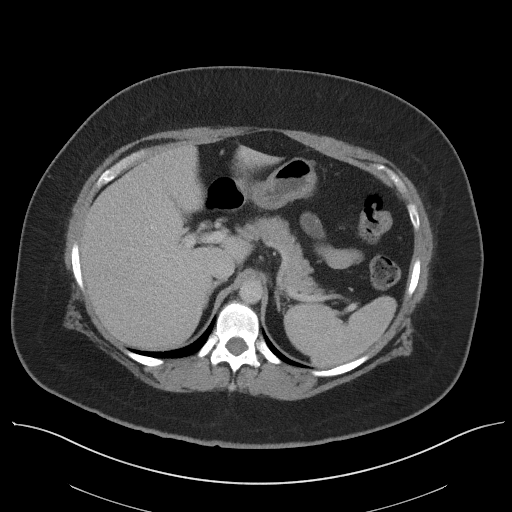
[im 83/94  soft-tissue]
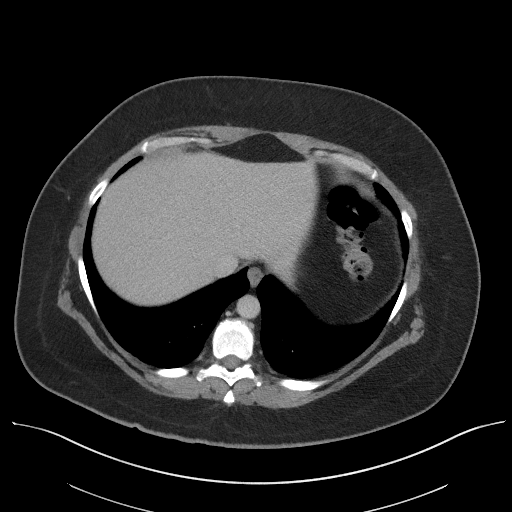
[im 88/94  soft-tissue]
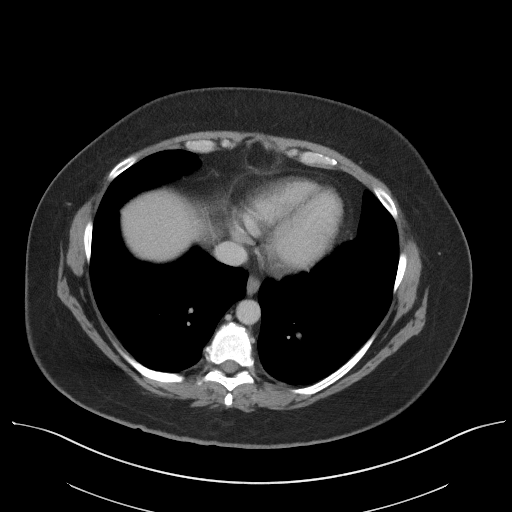

[Series 5: coronal st · coronal · 0.85mm/px · 3 of 102 slices shown]
[im 34/102  soft-tissue]
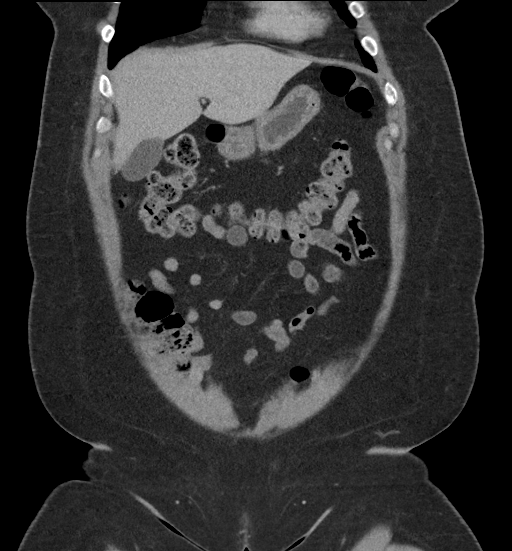
[im 45/102  soft-tissue]
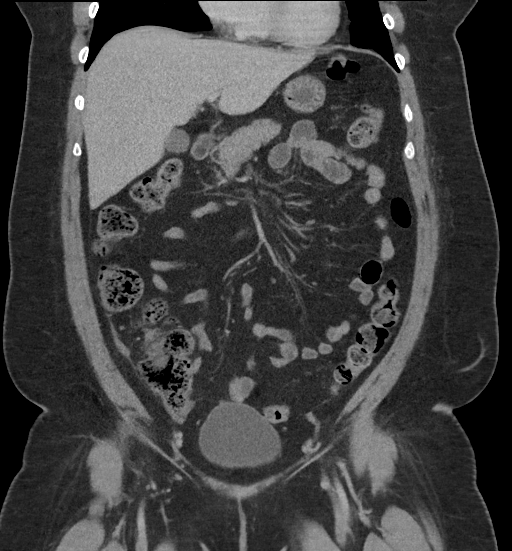
[im 57/102  soft-tissue]
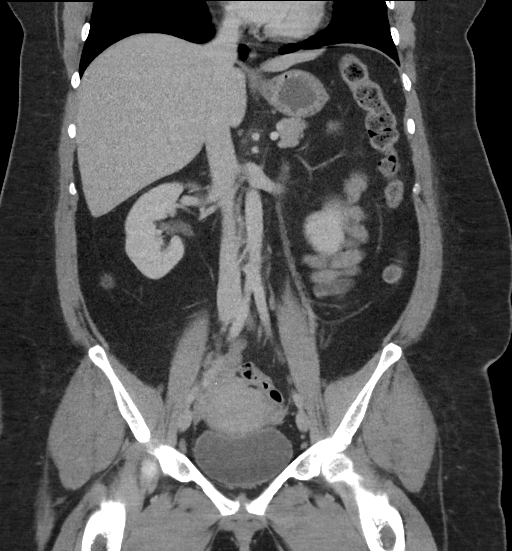

[16 of 46 positions shown; findings below may reference images not displayed]

FINDINGS: Lower chest: Normal size heart. Minimal atelectasis at the lung
bases.

Hepatobiliary: No focal liver abnormality is seen. No gallstones,
gallbladder wall thickening, or biliary dilatation.

Pancreas: Normal

Spleen: Normal

Adrenals/Urinary Tract: Normal bilateral adrenal glands. Symmetric
enhancement of both kidneys. Small cysts noted in the lower pole of
the right kidney, the largest approximately 1 cm. No obstructive
uropathy. Nonobstructing left lower pole renal calculus measuring up
to 5 mm. Normal bladder.

Stomach/Bowel: The stomach is nondistended. Normal small bowel
rotation and ligament of Treitz position. Colon is unremarkable. The
distal and terminal ileum insert along the posterior aspect of the
cecum, series 2, image 62, simulating an appendix. No evidence of
acute appendicitis. No abscess or perforation.

Vascular/Lymphatic: No significant vascular findings are present. No
enlarged abdominal or pelvic lymph nodes.

Reproductive: A few nabothian cysts are noted at the cervix. The
uterus and adnexa are otherwise within normal limits.

Other: No free air nor free fluid.

Musculoskeletal: Assimilation joint on the right at L5-S1. No acute
osseous abnormality.
IMPRESSION: 1. Right lower pole renal cysts, the largest measuring approximately
10 mm.
2. 5 mm nonobstructing left lower pole renal calculus.
3. The appendix is not confidently identified however no pericecal
inflammation is seen.
4. Assimilation joint at L5-S1 on the right.

## 2020-02-20 ENCOUNTER — Other Ambulatory Visit: Payer: Self-pay | Admitting: Internal Medicine

## 2020-03-07 ENCOUNTER — Inpatient Hospital Stay
Admission: EM | Admit: 2020-03-07 | Discharge: 2020-03-30 | DRG: 208 | Disposition: E | Payer: Managed Care, Other (non HMO) | Attending: Internal Medicine | Admitting: Internal Medicine

## 2020-03-07 ENCOUNTER — Other Ambulatory Visit: Payer: Self-pay

## 2020-03-07 ENCOUNTER — Encounter: Payer: Self-pay | Admitting: Emergency Medicine

## 2020-03-07 ENCOUNTER — Emergency Department: Payer: Managed Care, Other (non HMO)

## 2020-03-07 DIAGNOSIS — Z9851 Tubal ligation status: Secondary | ICD-10-CM

## 2020-03-07 DIAGNOSIS — Z66 Do not resuscitate: Secondary | ICD-10-CM | POA: Diagnosis not present

## 2020-03-07 DIAGNOSIS — J9602 Acute respiratory failure with hypercapnia: Secondary | ICD-10-CM | POA: Diagnosis present

## 2020-03-07 DIAGNOSIS — N179 Acute kidney failure, unspecified: Secondary | ICD-10-CM | POA: Diagnosis not present

## 2020-03-07 DIAGNOSIS — Z9289 Personal history of other medical treatment: Secondary | ICD-10-CM

## 2020-03-07 DIAGNOSIS — E7800 Pure hypercholesterolemia, unspecified: Secondary | ICD-10-CM | POA: Diagnosis present

## 2020-03-07 DIAGNOSIS — R7989 Other specified abnormal findings of blood chemistry: Secondary | ICD-10-CM | POA: Diagnosis present

## 2020-03-07 DIAGNOSIS — E870 Hyperosmolality and hypernatremia: Secondary | ICD-10-CM | POA: Diagnosis not present

## 2020-03-07 DIAGNOSIS — J9600 Acute respiratory failure, unspecified whether with hypoxia or hypercapnia: Secondary | ICD-10-CM

## 2020-03-07 DIAGNOSIS — J80 Acute respiratory distress syndrome: Secondary | ICD-10-CM | POA: Diagnosis present

## 2020-03-07 DIAGNOSIS — Z0189 Encounter for other specified special examinations: Secondary | ICD-10-CM

## 2020-03-07 DIAGNOSIS — D6859 Other primary thrombophilia: Secondary | ICD-10-CM | POA: Diagnosis present

## 2020-03-07 DIAGNOSIS — I110 Hypertensive heart disease with heart failure: Secondary | ICD-10-CM | POA: Diagnosis not present

## 2020-03-07 DIAGNOSIS — K819 Cholecystitis, unspecified: Secondary | ICD-10-CM

## 2020-03-07 DIAGNOSIS — Z800 Family history of malignant neoplasm of digestive organs: Secondary | ICD-10-CM | POA: Diagnosis not present

## 2020-03-07 DIAGNOSIS — R6521 Severe sepsis with septic shock: Secondary | ICD-10-CM | POA: Diagnosis not present

## 2020-03-07 DIAGNOSIS — Z4659 Encounter for fitting and adjustment of other gastrointestinal appliance and device: Secondary | ICD-10-CM

## 2020-03-07 DIAGNOSIS — Z992 Dependence on renal dialysis: Secondary | ICD-10-CM

## 2020-03-07 DIAGNOSIS — U071 COVID-19: Secondary | ICD-10-CM | POA: Diagnosis not present

## 2020-03-07 DIAGNOSIS — D649 Anemia, unspecified: Secondary | ICD-10-CM | POA: Diagnosis present

## 2020-03-07 DIAGNOSIS — J9601 Acute respiratory failure with hypoxia: Secondary | ICD-10-CM | POA: Diagnosis not present

## 2020-03-07 DIAGNOSIS — Z8249 Family history of ischemic heart disease and other diseases of the circulatory system: Secondary | ICD-10-CM | POA: Diagnosis not present

## 2020-03-07 DIAGNOSIS — I255 Ischemic cardiomyopathy: Secondary | ICD-10-CM | POA: Diagnosis not present

## 2020-03-07 DIAGNOSIS — Z79899 Other long term (current) drug therapy: Secondary | ICD-10-CM

## 2020-03-07 DIAGNOSIS — Z6841 Body Mass Index (BMI) 40.0 and over, adult: Secondary | ICD-10-CM | POA: Diagnosis not present

## 2020-03-07 DIAGNOSIS — E1165 Type 2 diabetes mellitus with hyperglycemia: Secondary | ICD-10-CM | POA: Diagnosis present

## 2020-03-07 DIAGNOSIS — I5031 Acute diastolic (congestive) heart failure: Secondary | ICD-10-CM | POA: Diagnosis not present

## 2020-03-07 DIAGNOSIS — R34 Anuria and oliguria: Secondary | ICD-10-CM | POA: Diagnosis not present

## 2020-03-07 DIAGNOSIS — E875 Hyperkalemia: Secondary | ICD-10-CM | POA: Diagnosis not present

## 2020-03-07 DIAGNOSIS — Z803 Family history of malignant neoplasm of breast: Secondary | ICD-10-CM | POA: Diagnosis not present

## 2020-03-07 DIAGNOSIS — Z978 Presence of other specified devices: Secondary | ICD-10-CM | POA: Diagnosis not present

## 2020-03-07 DIAGNOSIS — Z888 Allergy status to other drugs, medicaments and biological substances status: Secondary | ICD-10-CM | POA: Diagnosis not present

## 2020-03-07 DIAGNOSIS — G92 Toxic encephalopathy: Secondary | ICD-10-CM | POA: Diagnosis not present

## 2020-03-07 DIAGNOSIS — E872 Acidosis: Secondary | ICD-10-CM | POA: Diagnosis not present

## 2020-03-07 DIAGNOSIS — A4189 Other specified sepsis: Secondary | ICD-10-CM | POA: Diagnosis not present

## 2020-03-07 DIAGNOSIS — Z515 Encounter for palliative care: Secondary | ICD-10-CM | POA: Diagnosis not present

## 2020-03-07 DIAGNOSIS — J189 Pneumonia, unspecified organism: Secondary | ICD-10-CM

## 2020-03-07 DIAGNOSIS — Z7189 Other specified counseling: Secondary | ICD-10-CM | POA: Diagnosis not present

## 2020-03-07 DIAGNOSIS — R7401 Elevation of levels of liver transaminase levels: Secondary | ICD-10-CM | POA: Diagnosis present

## 2020-03-07 DIAGNOSIS — J1282 Pneumonia due to coronavirus disease 2019: Secondary | ICD-10-CM | POA: Diagnosis present

## 2020-03-07 DIAGNOSIS — R571 Hypovolemic shock: Secondary | ICD-10-CM | POA: Diagnosis not present

## 2020-03-07 DIAGNOSIS — E785 Hyperlipidemia, unspecified: Secondary | ICD-10-CM | POA: Diagnosis present

## 2020-03-07 DIAGNOSIS — G4733 Obstructive sleep apnea (adult) (pediatric): Secondary | ICD-10-CM | POA: Diagnosis present

## 2020-03-07 LAB — HEPATIC FUNCTION PANEL
ALT: 50 U/L — ABNORMAL HIGH (ref 0–44)
AST: 68 U/L — ABNORMAL HIGH (ref 15–41)
Albumin: 3 g/dL — ABNORMAL LOW (ref 3.5–5.0)
Alkaline Phosphatase: 78 U/L (ref 38–126)
Bilirubin, Direct: <0.1 mg/dL (ref 0.0–0.2)
Total Bilirubin: 0.7 mg/dL (ref 0.3–1.2)
Total Protein: 7 g/dL (ref 6.5–8.1)

## 2020-03-07 LAB — BASIC METABOLIC PANEL
Anion gap: 13 (ref 5–15)
BUN: 7 mg/dL (ref 6–20)
CO2: 24 mmol/L (ref 22–32)
Calcium: 7.9 mg/dL — ABNORMAL LOW (ref 8.9–10.3)
Chloride: 101 mmol/L (ref 98–111)
Creatinine, Ser: 0.81 mg/dL (ref 0.44–1.00)
GFR calc Af Amer: >60 mL/min (ref >60)
GFR calc non Af Amer: >60 mL/min (ref >60)
Glucose, Bld: 170 mg/dL — ABNORMAL HIGH (ref 70–99)
Potassium: 3.6 mmol/L (ref 3.5–5.1)
Sodium: 138 mmol/L (ref 135–145)

## 2020-03-07 LAB — HEMOGLOBIN A1C
Hgb A1c MFr Bld: 7.3 % — ABNORMAL HIGH (ref 4.8–5.6)
Mean Plasma Glucose: 162.81 mg/dL

## 2020-03-07 LAB — CBC
HCT: 36.6 % (ref 36.0–46.0)
Hemoglobin: 12.1 g/dL (ref 12.0–15.0)
MCH: 25.6 pg — ABNORMAL LOW (ref 26.0–34.0)
MCHC: 33.1 g/dL (ref 30.0–36.0)
MCV: 77.4 fL — ABNORMAL LOW (ref 80.0–100.0)
Platelets: 207 10*3/uL (ref 150–400)
RBC: 4.73 MIL/uL (ref 3.87–5.11)
RDW: 14.7 % (ref 11.5–15.5)
WBC: 5.7 10*3/uL (ref 4.0–10.5)
nRBC: 0 % (ref 0.0–0.2)

## 2020-03-07 LAB — GLUCOSE, CAPILLARY
Glucose-Capillary: 200 mg/dL — ABNORMAL HIGH (ref 70–99)
Glucose-Capillary: 202 mg/dL — ABNORMAL HIGH (ref 70–99)
Glucose-Capillary: 253 mg/dL — ABNORMAL HIGH (ref 70–99)

## 2020-03-07 LAB — FERRITIN: Ferritin: 443 ng/mL — ABNORMAL HIGH (ref 11–307)

## 2020-03-07 LAB — LACTATE DEHYDROGENASE: LDH: 546 U/L — ABNORMAL HIGH (ref 98–192)

## 2020-03-07 LAB — PROCALCITONIN: Procalcitonin: 0.13 ng/mL

## 2020-03-07 LAB — TROPONIN I (HIGH SENSITIVITY): Troponin I (High Sensitivity): 17 ng/L (ref <18)

## 2020-03-07 LAB — SARS CORONAVIRUS 2 BY RT PCR (HOSPITAL ORDER, PERFORMED IN ~~LOC~~ HOSPITAL LAB): SARS Coronavirus 2: POSITIVE — AB

## 2020-03-07 LAB — FIBRINOGEN: Fibrinogen: >750 mg/dL — ABNORMAL HIGH (ref 210–475)

## 2020-03-07 LAB — FIBRIN DERIVATIVES D-DIMER (ARMC ONLY): Fibrin derivatives D-dimer (ARMC): 1512.85 ng/mL (FEU) — ABNORMAL HIGH (ref 0.00–499.00)

## 2020-03-07 MED ORDER — INSULIN ASPART 100 UNIT/ML ~~LOC~~ SOLN
0.0000 [IU] | Freq: Three times a day (TID) | SUBCUTANEOUS | Status: DC
  Administered 2020-03-07: 5 [IU] via SUBCUTANEOUS
  Administered 2020-03-08 (×3): 3 [IU] via SUBCUTANEOUS
  Administered 2020-03-09 (×3): 8 [IU] via SUBCUTANEOUS
  Administered 2020-03-10: 15 [IU] via SUBCUTANEOUS
  Administered 2020-03-10 – 2020-03-11 (×4): 5 [IU] via SUBCUTANEOUS
  Administered 2020-03-11: 3 [IU] via SUBCUTANEOUS
  Filled 2020-03-07 (×13): qty 1

## 2020-03-07 MED ORDER — SODIUM CHLORIDE 0.9% FLUSH
3.0000 mL | Freq: Two times a day (BID) | INTRAVENOUS | Status: DC
  Administered 2020-03-08 – 2020-03-16 (×18): 3 mL via INTRAVENOUS

## 2020-03-07 MED ORDER — LACTATED RINGERS IV SOLN: INTRAVENOUS | Status: AC

## 2020-03-07 MED ORDER — ASCORBIC ACID 500 MG PO TABS
500.0000 mg | ORAL_TABLET | Freq: Every day | ORAL | Status: DC
  Administered 2020-03-07 – 2020-03-16 (×9): 500 mg via ORAL
  Filled 2020-03-07 (×9): qty 1

## 2020-03-07 MED ORDER — SODIUM CHLORIDE 0.9 % IV SOLN
200.0000 mg | Freq: Once | INTRAVENOUS | Status: AC
  Administered 2020-03-07: 200 mg via INTRAVENOUS
  Filled 2020-03-07: qty 200

## 2020-03-07 MED ORDER — IPRATROPIUM-ALBUTEROL 20-100 MCG/ACT IN AERS
1.0000 | INHALATION_SPRAY | Freq: Four times a day (QID) | RESPIRATORY_TRACT | Status: DC
  Administered 2020-03-07 – 2020-03-14 (×21): 1 via RESPIRATORY_TRACT
  Filled 2020-03-07 (×3): qty 4

## 2020-03-07 MED ORDER — SODIUM CHLORIDE 0.9 % IV BOLUS
1000.0000 mL | Freq: Once | INTRAVENOUS | Status: AC
  Administered 2020-03-07: 1000 mL via INTRAVENOUS

## 2020-03-07 MED ORDER — HYDROCOD POLST-CPM POLST ER 10-8 MG/5ML PO SUER
5.0000 mL | Freq: Once | ORAL | Status: AC
  Administered 2020-03-07: 5 mL via ORAL
  Filled 2020-03-07: qty 5

## 2020-03-07 MED ORDER — SODIUM CHLORIDE 0.9 % IV SOLN
100.0000 mg | Freq: Every day | INTRAVENOUS | Status: AC
  Administered 2020-03-08 – 2020-03-11 (×4): 100 mg via INTRAVENOUS
  Filled 2020-03-07: qty 100
  Filled 2020-03-07: qty 20
  Filled 2020-03-07: qty 100
  Filled 2020-03-07: qty 20

## 2020-03-07 MED ORDER — HYDROCOD POLST-CPM POLST ER 10-8 MG/5ML PO SUER
5.0000 mL | Freq: Two times a day (BID) | ORAL | Status: DC | PRN
  Administered 2020-03-11 – 2020-03-12 (×2): 5 mL via ORAL
  Filled 2020-03-07 (×2): qty 5

## 2020-03-07 MED ORDER — INSULIN ASPART 100 UNIT/ML ~~LOC~~ SOLN
0.0000 [IU] | Freq: Every day | SUBCUTANEOUS | Status: DC
  Administered 2020-03-09 – 2020-03-10 (×2): 2 [IU] via SUBCUTANEOUS
  Filled 2020-03-07 (×2): qty 1

## 2020-03-07 MED ORDER — PANTOPRAZOLE SODIUM 40 MG PO TBEC
40.0000 mg | DELAYED_RELEASE_TABLET | Freq: Every day | ORAL | Status: DC
  Administered 2020-03-07 – 2020-03-08 (×2): 40 mg via ORAL
  Filled 2020-03-07 (×2): qty 1

## 2020-03-07 MED ORDER — POLYETHYLENE GLYCOL 3350 17 G PO PACK: 17.0000 g | PACK | Freq: Every day | ORAL | Status: DC | PRN

## 2020-03-07 MED ORDER — DEXAMETHASONE SODIUM PHOSPHATE 10 MG/ML IJ SOLN
6.0000 mg | INTRAMUSCULAR | Status: DC
  Filled 2020-03-07: qty 1

## 2020-03-07 MED ORDER — GUAIFENESIN-DM 100-10 MG/5ML PO SYRP
10.0000 mL | ORAL_SOLUTION | ORAL | Status: DC | PRN
  Administered 2020-03-08 (×2): 10 mL via ORAL
  Filled 2020-03-07 (×3): qty 10

## 2020-03-07 MED ORDER — ZINC SULFATE 220 (50 ZN) MG PO CAPS
220.0000 mg | ORAL_CAPSULE | Freq: Every day | ORAL | Status: DC
  Administered 2020-03-07 – 2020-03-16 (×8): 220 mg via ORAL
  Filled 2020-03-07 (×9): qty 1

## 2020-03-07 MED ORDER — LORATADINE 10 MG PO TABS: 10.0000 mg | ORAL_TABLET | Freq: Every day | ORAL | Status: DC | PRN

## 2020-03-07 MED ORDER — ONDANSETRON HCL 4 MG PO TABS: 4.0000 mg | ORAL_TABLET | Freq: Four times a day (QID) | ORAL | Status: DC | PRN

## 2020-03-07 MED ORDER — ONDANSETRON HCL 4 MG/2ML IJ SOLN
4.0000 mg | Freq: Four times a day (QID) | INTRAMUSCULAR | Status: DC | PRN
  Administered 2020-03-10 – 2020-03-12 (×3): 4 mg via INTRAVENOUS
  Filled 2020-03-07 (×3): qty 2

## 2020-03-07 MED ORDER — ACETAMINOPHEN 325 MG PO TABS
650.0000 mg | ORAL_TABLET | Freq: Four times a day (QID) | ORAL | Status: DC | PRN
  Administered 2020-03-07 – 2020-03-16 (×3): 650 mg via ORAL
  Filled 2020-03-07: qty 2

## 2020-03-07 MED ORDER — ENOXAPARIN SODIUM 40 MG/0.4ML ~~LOC~~ SOLN
40.0000 mg | Freq: Two times a day (BID) | SUBCUTANEOUS | Status: DC
  Administered 2020-03-07 – 2020-03-08 (×2): 40 mg via SUBCUTANEOUS
  Filled 2020-03-07: qty 0.4

## 2020-03-07 MED ORDER — ONDANSETRON HCL 4 MG/2ML IJ SOLN
4.0000 mg | Freq: Once | INTRAMUSCULAR | Status: AC
  Administered 2020-03-07: 4 mg via INTRAVENOUS
  Filled 2020-03-07: qty 2

## 2020-03-07 MED ORDER — DEXAMETHASONE SODIUM PHOSPHATE 10 MG/ML IJ SOLN
10.0000 mg | Freq: Once | INTRAMUSCULAR | Status: AC
  Administered 2020-03-07: 10 mg via INTRAVENOUS
  Filled 2020-03-07: qty 1

## 2020-03-07 NOTE — ED Triage Notes (Signed)
Patient presents to the ED with shortness of breath and weakness.  Patient states, "I can't even stand up."  Patient was diagnosed with Covid19 11 days ago.  Patient had low oxygen levels on room air by EMS.  EMS placed patient on 6L.  This RN turned patient's oxygen down to 4L and oxygen level remained 94% or above.  Patient is speaking in full sentences at this time.

## 2020-03-07 NOTE — Progress Notes (Signed)
Remdesivir - Pharmacy Brief Note   Patient COVID+ last week per report.  O:  CXR: Diffuse patchy multifocal pneumonia consistent with the given clinical history of COVID-19 positivity. SpO2: 88% on 6 L [place]   A/P:  Remdesivir 200 mg IVPB once followed by 100 mg IVPB daily x 4 days.   Dorena Bodo, PharmD 03/25/2020 3:42 PM

## 2020-03-07 NOTE — H&P (Addendum)
History and Physical    KAMEAH RAWL BOF:[id number] DOB: [birth year] DOA: 03/02/2020  PCP: Einar Pheasant, MD   Patient coming from: Home  I have personally briefly reviewed patient's old medical records in [place]  Chief Complaint: Shortness of breath and generalized weakness.  HPI: Kathleen Espinoza is a 49 y.o. female with medical history significant of hypertension and dyslipidemia came to ED with complaint of worsening generalized weakness and shortness of breath. Patient started cough and mild exertional dyspnea approximately 11 to 12 days ago, due to persistent symptoms and some worsening she was tested for Covid at [place], no documentation 7-8 days ago and it was positive.  Husband with a similar symptoms.  For the past couple of days worsening shortness of breath and generalized weakness to the point that she was unable to perform her ADLs, prompted her to come to ED.  Denies any fever or chills.  No chest pain or orthopnea.  No nausea, vomiting or diarrhea.  No appetite.  No recent change in her bowel habits.  No urinary symptoms. Patient is unvaccinated.  ED Course: On arrival she was afebrile, mildly tachypneic and hypoxic in the mid 80s on room air.  Initially requiring 4 L, oxygen requirement went up to 6 L as she was desaturating with minor exertion.  Chest x-ray with bilateral infiltrate consistent with COVID-19 and pneumonia, D-dimer at 1512, AST of 68, ALT of 50, blood glucose of 170, rest of the inflammatory markers and repeat Covid 19 PCR pending. Started on remdesivir and steroid.  Review of Systems: As per HPI otherwise 10 point review of systems negative.   Past Medical History:  Diagnosis Date  . Abnormal pap    required freezing  . Hypercholesterolemia   . Hypertension   . Microscopic hematuria    s/p renal biopsy  . Thin basement membrane disease     Past Surgical History:  Procedure Laterality Date  . TONSILLECTOMY    . TUBAL LIGATION        reports that she has never smoked. She has never used smokeless tobacco. She reports that she does not drink alcohol and does not use drugs.  Allergies  Allergen Reactions  . Norvasc [Amlodipine Besylate] Swelling  . Betadine [Povidone Iodine] Swelling  . Iodine Swelling    Family History  Problem Relation Age of Onset  . Colon cancer Mother        died - age 64  . Hypertension Father   . Colon cancer Maternal Grandfather   . Breast cancer Maternal Aunt 60  . Breast cancer Paternal Aunt 72    Prior to Admission medications   Medication Sig Start Date End Date Taking? Authorizing Provider  fluticasone (FLONASE) 50 MCG/ACT nasal spray Place 2 sprays into both nostrils daily as needed for allergies or rhinitis. After nasal saline 12/08/19  Yes McLean-Scocuzza, Nino Glow, MD  hydrochlorothiazide (MICROZIDE) 12.5 MG capsule TAKE 1 CAPSULE BY MOUTH  DAILY 07/22/19  Yes Einar Pheasant, MD  lisinopril (ZESTRIL) 40 MG tablet TAKE 1 TABLET BY MOUTH  DAILY 09/01/19  Yes Einar Pheasant, MD  loratadine (CLARITIN) 10 MG tablet Take 1 tablet (10 mg total) by mouth daily as needed for allergies. 12/08/19  Yes McLean-Scocuzza, Nino Glow, MD  omeprazole (PRILOSEC) 20 MG capsule Take 1 capsule (20 mg total) by mouth daily. 02/01/19  Yes Einar Pheasant, MD  rosuvastatin (CRESTOR) 10 MG tablet TAKE 1 TABLET(10 MG) BY MOUTH DAILY 02/22/20  Yes Einar Pheasant, MD  sodium chloride (OCEAN) 0.65 % SOLN nasal spray Place 2 sprays into both nostrils daily as needed for congestion. 12/08/19  Yes McLean-Scocuzza, Nino Glow, MD    Physical Exam: Vitals:   03/09/2020 1456 03/22/2020 1500 03/26/2020 1530 03/06/2020 1600  BP: (!) 111/41 (!) 109/41 (!) 101/56 114/72  Pulse: 96 98 97 92  Resp: (!) 29 (!) 32 (!) 33 (!) 33  Temp:      TempSrc:      SpO2: 90% 90% (!) 86% 92%  Weight:      Height:        General: Vital signs reviewed.  Patient is well-developed and well-nourished, in no acute distress and cooperative with exam.   Head: Normocephalic and atraumatic. Eyes: EOMI, conjunctivae normal, no scleral icterus.  ENMT: Mucous membranes are moist.  Neck: Supple, trachea midline, normal ROM, no JVD, masses, thyromegaly, or carotid bruit present.  Cardiovascular: RRR, S1 normal, S2 normal, no murmurs, gallops, or rubs. Pulmonary/Chest: Clear to auscultation bilaterally, no wheezes, rales, or rhonchi. Abdominal: Soft, non-tender, non-distended, BS +, Extremities: No lower extremity edema bilaterally,  pulses symmetric and intact bilaterally. No cyanosis or clubbing. Neurological: A&O x3, Strength is normal and symmetric bilaterally, cranial nerve II-XII are grossly intact, no focal motor deficit, sensory intact to light touch bilaterally.  Skin: Warm, dry and intact. No rashes or erythema. Psychiatric: Normal mood and affect. speech and behavior is normal. Cognition and memory are normal.   Labs on Admission: I have personally reviewed following labs and imaging studies  CBC: Recent Labs  Lab 03/15/2020 0821  WBC 5.7  HGB 12.1  HCT 36.6  MCV 77.4*  PLT 338   Basic Metabolic Panel: Recent Labs  Lab 02/29/2020 0821  NA 138  K 3.6  CL 101  CO2 24  GLUCOSE 170*  BUN 7  CREATININE 0.81  CALCIUM 7.9*   GFR: Estimated Creatinine Clearance: 96.4 mL/min (by C-G formula based on SCr of 0.81 mg/dL). Liver Function Tests: Recent Labs  Lab 03/21/2020 0821  AST 68*  ALT 50*  ALKPHOS 78  BILITOT 0.7  PROT 7.0  ALBUMIN 3.0*   No results for input(s): LIPASE, AMYLASE in the last 168 hours. No results for input(s): AMMONIA in the last 168 hours. Coagulation Profile: No results for input(s): INR, PROTIME in the last 168 hours. Cardiac Enzymes: No results for input(s): CKTOTAL, CKMB, CKMBINDEX, TROPONINI in the last 168 hours. BNP (last 3 results) No results for input(s): PROBNP in the last 8760 hours. HbA1C: No results for input(s): HGBA1C in the last 72 hours. CBG: No results for input(s): GLUCAP in  the last 168 hours. Lipid Profile: No results for input(s): CHOL, HDL, LDLCALC, TRIG, CHOLHDL, LDLDIRECT in the last 72 hours. Thyroid Function Tests: No results for input(s): TSH, T4TOTAL, FREET4, T3FREE, THYROIDAB in the last 72 hours. Anemia Panel: No results for input(s): VITAMINB12, FOLATE, FERRITIN, TIBC, IRON, RETICCTPCT in the last 72 hours. Urine analysis:    Component Value Date/Time   COLORURINE STRAW (A) 09/14/2017 1326   APPEARANCEUR Clear 09/15/2018 1325   LABSPEC 1.012 09/14/2017 1326   PHURINE 6.0 09/14/2017 1326   GLUCOSEU Negative 09/15/2018 1325   HGBUR SMALL (A) 09/14/2017 1326   BILIRUBINUR negative 10/15/2018 0809   BILIRUBINUR Negative 09/15/2018 1325   KETONESUR NEGATIVE 09/14/2017 1326   PROTEINUR Positive (A) 10/15/2018 0809   PROTEINUR Trace 09/15/2018 1325   PROTEINUR NEGATIVE 09/14/2017 1326   UROBILINOGEN 0.2 10/15/2018 0809   NITRITE negative 10/15/2018 0809   NITRITE  Negative 09/15/2018 1325   NITRITE NEGATIVE 09/14/2017 1326   LEUKOCYTESUR Negative 10/15/2018 0809   LEUKOCYTESUR Negative 09/15/2018 1325    Radiological Exams on Admission: DG Chest 2 View  Result Date: 03/29/2020 CLINICAL DATA:  Shortness of breath and weakness, history of COVID-19 positivity EXAM: CHEST - 2 VIEW COMPARISON:  10/06/2015 FINDINGS: Cardiac shadow is within normal limits. The overall inspiratory effort is poor. Patchy airspace opacities are identified throughout both lungs. No sizable effusion is noted. No bony abnormality is seen. IMPRESSION: Diffuse patchy multifocal pneumonia consistent with the given clinical history of COVID-19 positivity. Electronically Signed   By: Inez Catalina M.D.   On: 03/18/2020 08:47    EKG: Independently reviewed.  Normal sinus rhythm.  Assessment/Plan Active Problems:   Pneumonia due to COVID-19 virus   Acute hypoxic respiratory failure secondary to COVID-19 pneumonia. Clinical symptoms, labs and imaging is consistent with COVID-19  pneumonia.  Elevated inflammatory markers.  Borderline elevation of AST and ALT.discussed with patient about Actemra which can be given if needed. Blood cultures taken in ED-pending results. Patient was started on remdesivir and Decadron-day 1. O2 supplement to keep saturation above 90%-currently at 4 L Monitor inflammatory markers. Supportive care. Prone as tolerated Actemra can be considered if CRP is markedly elevated or worsening oxygen requirement.  Hyperglycemia.  No prior diagnosis of diabetes. -We will check A1c. -Add SSI as patient is on steroid.  Transaminitis.  Mildly elevated AST and ALT.no prior history of hep B or C. -Checking for hep B and C. -Continue to monitor as patient is on remdesivir. -Give her some IV fluid.  Hypertension.  Currently normotensive, initially some softer blood pressure on presentation.  She takes lisinopril and HCTZ at home. -Holding home dose of lisinopril and HCTZ-can be restarted if blood pressure goes up.  Hyperlipidemia. -Holding home dose of statin due to mildly elevated transaminitis.   DVT prophylaxis: Lovenox Code Status: Full code Family Communication: No family member at bedside Disposition Plan: Home Consults called: None Admission status: Inpatient.   Lorella Nimrod MD [facility]  If 7PM-7AM, please contact night-coverage [email or web address]  03/01/2020, 4:42 PM   This record has been created using Systems analyst. Errors have been sought and corrected,but may not always be located. Such creation errors do not reflect on the standard of care.

## 2020-03-07 NOTE — ED Notes (Signed)
Pt stood and pivot to hospital bed, upon lying in bed, pt noted to have O2 sats in high 70s.  O2 was maintained for entire move.  Pt states she feels like the O2 was turned down, this RN verified 15 L per .  Pt on recovered to low to mid 80s, RT notified to re-evaluate pt at this time.  Pt also encouraged to self prone as comfortable.  Will continue to monitor.

## 2020-03-07 NOTE — ED Provider Notes (Signed)
[facility] Emergency Department Provider Note  Time seen: 3:28 PM  I have reviewed the triage vital signs and the nursing notes.   HISTORY  Chief Complaint COVID  HPI Kathleen Espinoza is a 49 y.o. female with a past medical history of hypertension, hyperlipidemia, presents to the emergency department for shortness of breath, Covid positive.  According to the patient she has been sick now for 11 days with upper respiratory symptoms cough congestion nausea fever.  Patient states she was tested last week and tested positive for Covid.  Patient symptoms worsened over the weekend and today she has been feeling significantly short of breath so she came to the emergency department for evaluation.  Patient found to be satting 84% on 4 L, 88% on 6 L no baseline O2 requirement.   Past Medical History:  Diagnosis Date  . Abnormal pap    required freezing  . Hypercholesterolemia   . Hypertension   . Microscopic hematuria    s/p renal biopsy  . Thin basement membrane disease     Patient Active Problem List   Diagnosis Date Noted  . Acid reflux 09/20/2018  . Stress 01/05/2018  . Right lower quadrant abdominal pain   . Basal cell carcinoma 09/01/2017  . Left tennis elbow 08/21/2015  . Facial lesion 04/09/2015  . Rib pain 04/09/2015  . Health care maintenance 04/09/2015  . SOB (shortness of breath) 01/24/2014  . BMI 40.0-44.9, adult ([place]) 01/24/2014  . Irregular menstrual cycle 09/20/2013  . Hyperglycemia 04/27/2013  . Hematuria 06/02/2012  . Hypercholesterolemia 06/02/2012  . Renal cyst 06/02/2012  . Hypertension 05/26/2012    Past Surgical History:  Procedure Laterality Date  . TONSILLECTOMY    . TUBAL LIGATION      Prior to Admission medications   Medication Sig Start Date End Date Taking? Authorizing Provider  fluticasone (FLONASE) 50 MCG/ACT nasal spray Place 2 sprays into both nostrils daily as needed for allergies or rhinitis. After nasal saline  12/08/19  Yes McLean-Scocuzza, Nino Glow, MD  hydrochlorothiazide (MICROZIDE) 12.5 MG capsule TAKE 1 CAPSULE BY MOUTH  DAILY 07/22/19  Yes Einar Pheasant, MD  lisinopril (ZESTRIL) 40 MG tablet TAKE 1 TABLET BY MOUTH  DAILY 09/01/19  Yes Einar Pheasant, MD  loratadine (CLARITIN) 10 MG tablet Take 1 tablet (10 mg total) by mouth daily as needed for allergies. 12/08/19  Yes McLean-Scocuzza, Nino Glow, MD  omeprazole (PRILOSEC) 20 MG capsule Take 1 capsule (20 mg total) by mouth daily. 02/01/19  Yes Einar Pheasant, MD  rosuvastatin (CRESTOR) 10 MG tablet TAKE 1 TABLET(10 MG) BY MOUTH DAILY 02/22/20  Yes Einar Pheasant, MD  sodium chloride (OCEAN) 0.65 % SOLN nasal spray Place 2 sprays into both nostrils daily as needed for congestion. 12/08/19  Yes McLean-Scocuzza, Nino Glow, MD    Allergies  Allergen Reactions  . Norvasc [Amlodipine Besylate] Swelling  . Betadine [Povidone Iodine] Swelling  . Iodine Swelling    Family History  Problem Relation Age of Onset  . Colon cancer Mother        died - age 95  . Hypertension Father   . Colon cancer Maternal Grandfather   . Breast cancer Maternal Aunt 60  . Breast cancer Paternal Aunt 67    Social History Social History   Tobacco Use  . Smoking status: Never Smoker  . Smokeless tobacco: Never Used  Vaping Use  . Vaping Use: Never used  Substance Use Topics  . Alcohol use: No  Alcohol/week: 0.0 standard drinks  . Drug use: No    Review of Systems Constitutional: Positive for fevers Cardiovascular: Negative for chest pain. Respiratory: Positive for shortness of breath.  Positive cough. Gastrointestinal: Negative for abdominal pain.  Positive for nausea. Genitourinary: Negative for urinary compaints Musculoskeletal: Negative for musculoskeletal complaints Neurological: Negative for headache All other ROS negative  ____________________________________________   PHYSICAL EXAM:  VITAL SIGNS: ED Triage Vitals  Enc Vitals Group     BP  03/23/2020 0811 (!) 134/57     Pulse Rate 03/24/2020 0811 99     Resp 03/29/2020 0811 (!) 24     Temp 03/06/2020 0811 99.6 F (37.6 C)     Temp Source 02/28/2020 0811 Oral     SpO2 03/11/2020 0811 94 %     Weight 03/13/2020 0812 238 lb (108 kg)     Height 03/18/2020 0812 5\' 1"  (1.549 m)     Head Circumference --      Peak Flow --      Pain Score 03/02/2020 0812 0     Pain Loc --      Pain Edu? --      Excl. in Gross? --     Constitutional: Alert and oriented. Well appearing and in no distress. Eyes: Normal exam ENT      Head: Normocephalic and atraumatic.      Mouth/Throat: Mucous membranes are moist. Cardiovascular: Normal rate, regular rhythm around 100 bpm. Respiratory: Normal respiratory effort with moderate tachypnea.  Patient has frequent cough during examination but no obvious rhonchi. Gastrointestinal: Soft and nontender. No distention. Musculoskeletal: Nontender with normal range of motion in all extremities.  Neurologic:  Normal speech and language. No gross focal neurologic deficits  Skin:  Skin is warm, dry and intact.  Psychiatric: Mood and affect are normal.   ____________________________________________    EKG  EKG viewed and interpreted by myself shows a normal sinus rhythm at 97 bpm with a narrow QRS, normal axis, normal intervals, no concerning ST changes.  ____________________________________________    RADIOLOGY  Diffuse patchy infiltrates consistent multifocal pneumonia.  ____________________________________________   INITIAL IMPRESSION / ASSESSMENT AND PLAN / ED COURSE  Pertinent labs & imaging results that were available during my care of the patient were reviewed by me and considered in my medical decision making (see chart for details).   Patient presents to the emergency department for worsening cough congestion fever nausea and shortness of breath.  Patient found to be Covid positive last week.  This was done at an outside facility however we will repeat the  test in the emergency department.  Patient's labs are largely within normal limits including a normal white blood cell count.  Chest x-ray shows diffuse patchy opacities consistent with Covid pneumonia.  Patient satting 88% currently on 6 L of oxygen.  We will start the patient on high flow nasal cannula.  We will discuss with the hospitalist for admission.  I have ordered IV Decadron and IV remdesivir for the patient.  Patient also receiving IV fluids Intestinex.  Kathleen Espinoza was evaluated in Emergency Department on 03/23/2020 for the symptoms described in the history of present illness. She was evaluated in the context of the global COVID-19 pandemic, which necessitated consideration that the patient might be at risk for infection with the SARS-CoV-2 virus that causes COVID-19. Institutional protocols and algorithms that pertain to the evaluation of patients at risk for COVID-19 are in a state of rapid change based on information released by  regulatory bodies including the CDC and federal and state organizations. These policies and algorithms were followed during the patient's care in the ED.  CRITICAL CARE Performed by: Harvest Dark   Total critical care time: 30 minutes  Critical care time was exclusive of separately billable procedures and treating other patients.  Critical care was necessary to treat or prevent imminent or life-threatening deterioration.  Critical care was time spent personally by me on the following activities: development of treatment plan with patient and/or surrogate as well as nursing, discussions with consultants, evaluation of patient's response to treatment, examination of patient, obtaining history from patient or surrogate, ordering and performing treatments and interventions, ordering and review of laboratory studies, ordering and review of radiographic studies, pulse oximetry and re-evaluation of patient's  condition.   ____________________________________________   FINAL CLINICAL IMPRESSION(S) / ED DIAGNOSES  COVID-19 Hypoxia   Harvest Dark, MD 03/16/2020 1537

## 2020-03-07 NOTE — ED Triage Notes (Signed)
Presents vis EMS with SOB  Positive COVID 11 days ago  Per EMS o2 sats were 82 5 on RA  Placed on 6l  Now o2 sats 93%

## 2020-03-07 NOTE — Progress Notes (Signed)
PHARMACIST - PHYSICIAN COMMUNICATION  CONCERNING:  Enoxaparin (Lovenox) for DVT Prophylaxis    RECOMMENDATION: Patient was prescribed enoxaparin 40mg  q24 hours for VTE prophylaxis.   Filed Weights   03/08/2020 0812  Weight: 108 kg (238 lb)    Body mass index is 44.97 kg/m.  Estimated Creatinine Clearance: 96.4 mL/min (by C-G formula based on SCr of 0.81 mg/dL).   Based on [place] patient is candidate for enoxaparin 40mg  every 12 hour dosing due to BMI being >40.   DESCRIPTION: Pharmacy has adjusted enoxaparin dose per [facility] policy.  Patient is now receiving enoxaparin 40mg  every 12 hours.    Tawnya Crook, PharmD Clinical Pharmacist  02/29/2020 6:21 PM

## 2020-03-08 ENCOUNTER — Inpatient Hospital Stay: Payer: Managed Care, Other (non HMO)

## 2020-03-08 DIAGNOSIS — J1282 Pneumonia due to coronavirus disease 2019: Secondary | ICD-10-CM

## 2020-03-08 DIAGNOSIS — U071 COVID-19: Principal | ICD-10-CM

## 2020-03-08 LAB — COMPREHENSIVE METABOLIC PANEL
ALT: 46 U/L — ABNORMAL HIGH (ref 0–44)
AST: 57 U/L — ABNORMAL HIGH (ref 15–41)
Albumin: 2.6 g/dL — ABNORMAL LOW (ref 3.5–5.0)
Alkaline Phosphatase: 63 U/L (ref 38–126)
Anion gap: 12 (ref 5–15)
BUN: 12 mg/dL (ref 6–20)
CO2: 23 mmol/L (ref 22–32)
Calcium: 7.8 mg/dL — ABNORMAL LOW (ref 8.9–10.3)
Chloride: 104 mmol/L (ref 98–111)
Creatinine, Ser: 0.66 mg/dL (ref 0.44–1.00)
GFR calc Af Amer: >60 mL/min (ref >60)
GFR calc non Af Amer: >60 mL/min (ref >60)
Glucose, Bld: 218 mg/dL — ABNORMAL HIGH (ref 70–99)
Potassium: 3.5 mmol/L (ref 3.5–5.1)
Sodium: 139 mmol/L (ref 135–145)
Total Bilirubin: 0.5 mg/dL (ref 0.3–1.2)
Total Protein: 6.5 g/dL (ref 6.5–8.1)

## 2020-03-08 LAB — CBC WITH DIFFERENTIAL/PLATELET
Abs Immature Granulocytes: 0.05 10*3/uL (ref 0.00–0.07)
Basophils Absolute: 0 10*3/uL (ref 0.0–0.1)
Basophils Relative: 0 %
Eosinophils Absolute: 0 10*3/uL (ref 0.0–0.5)
Eosinophils Relative: 0 %
HCT: 31.9 % — ABNORMAL LOW (ref 36.0–46.0)
Hemoglobin: 10.1 g/dL — ABNORMAL LOW (ref 12.0–15.0)
Immature Granulocytes: 1 %
Lymphocytes Relative: 16 %
Lymphs Abs: 0.6 10*3/uL — ABNORMAL LOW (ref 0.7–4.0)
MCH: 25.3 pg — ABNORMAL LOW (ref 26.0–34.0)
MCHC: 31.7 g/dL (ref 30.0–36.0)
MCV: 79.9 fL — ABNORMAL LOW (ref 80.0–100.0)
Monocytes Absolute: 0.3 10*3/uL (ref 0.1–1.0)
Monocytes Relative: 9 %
Neutro Abs: 2.6 10*3/uL (ref 1.7–7.7)
Neutrophils Relative %: 74 %
Platelets: 211 10*3/uL (ref 150–400)
RBC: 3.99 MIL/uL (ref 3.87–5.11)
RDW: 14.6 % (ref 11.5–15.5)
Smear Review: NORMAL
WBC: 3.6 10*3/uL — ABNORMAL LOW (ref 4.0–10.5)
nRBC: 0 % (ref 0.0–0.2)

## 2020-03-08 LAB — BLOOD GAS, ARTERIAL
Acid-Base Excess: 3.2 mmol/L — ABNORMAL HIGH (ref 0.0–2.0)
Bicarbonate: 26.9 mmol/L (ref 20.0–28.0)
FIO2: 0.94
O2 Saturation: 94.9 %
Patient temperature: 37
pCO2 arterial: 37 mmHg (ref 32.0–48.0)
pH, Arterial: 7.47 — ABNORMAL HIGH (ref 7.350–7.450)
pO2, Arterial: 70 mmHg — ABNORMAL LOW (ref 83.0–108.0)

## 2020-03-08 LAB — ABO/RH: ABO/RH(D): A POS

## 2020-03-08 LAB — HIV ANTIBODY (ROUTINE TESTING W REFLEX): HIV Screen 4th Generation wRfx: NONREACTIVE

## 2020-03-08 LAB — D-DIMER, QUANTITATIVE: D-Dimer, Quant: 1.37 ug/mL-FEU — ABNORMAL HIGH (ref 0.00–0.50)

## 2020-03-08 LAB — MAGNESIUM: Magnesium: 2 mg/dL (ref 1.7–2.4)

## 2020-03-08 LAB — GLUCOSE, CAPILLARY
Glucose-Capillary: 185 mg/dL — ABNORMAL HIGH (ref 70–99)
Glucose-Capillary: 195 mg/dL — ABNORMAL HIGH (ref 70–99)
Glucose-Capillary: 182 mg/dL — ABNORMAL HIGH (ref 70–99)
Glucose-Capillary: 186 mg/dL — ABNORMAL HIGH (ref 70–99)

## 2020-03-08 LAB — PHOSPHORUS: Phosphorus: 2 mg/dL — ABNORMAL LOW (ref 2.5–4.6)

## 2020-03-08 LAB — HEPATITIS C ANTIBODY: HCV Ab: NONREACTIVE

## 2020-03-08 LAB — HEPATITIS B SURFACE ANTIGEN: Hepatitis B Surface Ag: NONREACTIVE

## 2020-03-08 LAB — C-REACTIVE PROTEIN: CRP: 18.4 mg/dL — ABNORMAL HIGH (ref <1.0)

## 2020-03-08 LAB — PROCALCITONIN: Procalcitonin: <0.1 ng/mL

## 2020-03-08 LAB — TROPONIN I (HIGH SENSITIVITY): Troponin I (High Sensitivity): 12 ng/L (ref <18)

## 2020-03-08 LAB — FERRITIN: Ferritin: 347 ng/mL — ABNORMAL HIGH (ref 11–307)

## 2020-03-08 MED ORDER — METHYLPREDNISOLONE SODIUM SUCC 125 MG IJ SOLR
60.0000 mg | Freq: Two times a day (BID) | INTRAMUSCULAR | Status: DC
  Administered 2020-03-08 – 2020-03-11 (×6): 60 mg via INTRAVENOUS
  Filled 2020-03-08 (×6): qty 2

## 2020-03-08 MED ORDER — NEPRO/CARBSTEADY PO LIQD
237.0000 mL | Freq: Three times a day (TID) | ORAL | Status: DC
  Administered 2020-03-09 – 2020-03-12 (×9): 237 mL via ORAL

## 2020-03-08 MED ORDER — TOCILIZUMAB 400 MG/20ML IV SOLN
800.0000 mg | Freq: Once | INTRAVENOUS | Status: AC
  Administered 2020-03-08: 800 mg via INTRAVENOUS
  Filled 2020-03-08: qty 40

## 2020-03-08 MED ORDER — ADULT MULTIVITAMIN W/MINERALS CH
1.0000 | ORAL_TABLET | Freq: Every day | ORAL | Status: DC
  Administered 2020-03-09 – 2020-03-16 (×7): 1 via ORAL
  Filled 2020-03-08 (×7): qty 1

## 2020-03-08 MED ORDER — ENOXAPARIN SODIUM 60 MG/0.6ML ~~LOC~~ SOLN
55.0000 mg | SUBCUTANEOUS | Status: DC
  Filled 2020-03-08: qty 0.6

## 2020-03-08 MED ORDER — IOHEXOL 350 MG/ML SOLN
75.0000 mL | Freq: Once | INTRAVENOUS | Status: AC | PRN
  Administered 2020-03-08: 75 mL via INTRAVENOUS

## 2020-03-08 MED ORDER — HYDROCORTISONE NA SUCCINATE PF 250 MG IJ SOLR
200.0000 mg | Freq: Once | INTRAMUSCULAR | Status: AC
  Administered 2020-03-08: 09:00:00 200 mg via INTRAVENOUS
  Filled 2020-03-08: qty 200

## 2020-03-08 MED ORDER — FUROSEMIDE 10 MG/ML IJ SOLN
40.0000 mg | Freq: Every day | INTRAMUSCULAR | Status: DC
  Administered 2020-03-08 – 2020-03-10 (×3): 40 mg via INTRAVENOUS
  Filled 2020-03-08 (×3): qty 4

## 2020-03-08 MED ORDER — IVERMECTIN 3 MG PO TABS
150.0000 ug/kg | ORAL_TABLET | Freq: Once | ORAL | Status: AC
  Administered 2020-03-08: 16500 ug via ORAL
  Filled 2020-03-08: qty 6

## 2020-03-08 MED ORDER — PANTOPRAZOLE SODIUM 40 MG IV SOLR
40.0000 mg | INTRAVENOUS | Status: DC
  Administered 2020-03-09 – 2020-03-17 (×9): 40 mg via INTRAVENOUS
  Filled 2020-03-08 (×9): qty 40

## 2020-03-08 MED ORDER — ORAL CARE MOUTH RINSE
15.0000 mL | Freq: Two times a day (BID) | OROMUCOSAL | Status: DC
  Administered 2020-03-08 – 2020-03-16 (×12): 15 mL via OROMUCOSAL

## 2020-03-08 MED ORDER — CHLORHEXIDINE GLUCONATE CLOTH 2 % EX PADS
6.0000 | MEDICATED_PAD | Freq: Every day | CUTANEOUS | Status: DC
  Administered 2020-03-09 – 2020-03-16 (×7): 6 via TOPICAL

## 2020-03-08 MED ORDER — DIPHENHYDRAMINE HCL 50 MG/ML IJ SOLN
50.0000 mg | Freq: Once | INTRAMUSCULAR | Status: AC
  Administered 2020-03-08: 12:00:00 50 mg via INTRAVENOUS
  Filled 2020-03-08: qty 1

## 2020-03-08 MED ORDER — DIPHENHYDRAMINE HCL 25 MG PO CAPS: 50.0000 mg | ORAL_CAPSULE | Freq: Once | ORAL | Status: AC

## 2020-03-08 NOTE — Consult Note (Signed)
Name: Kathleen Espinoza MRN: [id number] DOB: Oct 23, 1970    ADMISSION DATE:  03/02/2020 CONSULTATION DATE: 03/08/2020  REFERRING MD : Dr. Reesa Chew   CHIEF COMPLAINT: Shortness of Breath   BRIEF PATIENT DESCRIPTION:  49 yo female admitted with COVID-19 pneumonia requiring Bipap   SIGNIFICANT EVENTS/STUDIES:  08/9: Pt admitted to the telemetry unit with COVID-19 pneumonia  08/10: Pt transferred to ICU with worsening hypoxia requiring Bipap   HISTORY OF PRESENT ILLNESS:   This is a 49 yo female with a PMH of HTN, Hypercholesterolemia, Microscopic Hematuria s/p Renal Biopsy, and Thin Basement Membrane Disease.  She presented to [facility] ER on 08/9 via EMS with worsening shortness of breath.  Per ER notes pt tested positive for COVID-19 11 days at an outside facility prior to ER visit.  Per EMS upon their arrival at pts home her O2 sats were 82% on RA , therefore she was placed on 6L O2 via nasal canula with O2 sats increasing to 93%.  In the ER lab results revealed glucose 170, ferritin 443, LDH 546, pct 0.13, fibrinogen >750, d-dimer 1,512.85, and hemoglobin A1c 7.3.  COVID-19 positive and CXR concerning for diffuse patchy multifocal pneumonia consistent with COVID-19. She was started on iv decadron and remdesivir.  She was subsequently admitted to the telemetry unit per hospitalist team for additional workup and treatment.  On 08/10 pt developed worsening hypoxia requiring continuous Bipap and transfer to ICU.  PCCM team consulted to assist with management.     PAST MEDICAL HISTORY :   has a past medical history of Abnormal pap, Hypercholesterolemia, Hypertension, Microscopic hematuria, and Thin basement membrane disease.  has a past surgical history that includes Tonsillectomy and Tubal ligation. Prior to Admission medications   Medication Sig Start Date End Date Taking? Authorizing Provider  fluticasone (FLONASE) 50 MCG/ACT nasal spray Place 2 sprays into both nostrils daily as needed for allergies or  rhinitis. After nasal saline 12/08/19  Yes McLean-Scocuzza, Nino Glow, MD  hydrochlorothiazide (MICROZIDE) 12.5 MG capsule TAKE 1 CAPSULE BY MOUTH  DAILY 07/22/19  Yes Einar Pheasant, MD  lisinopril (ZESTRIL) 40 MG tablet TAKE 1 TABLET BY MOUTH  DAILY 09/01/19  Yes Einar Pheasant, MD  loratadine (CLARITIN) 10 MG tablet Take 1 tablet (10 mg total) by mouth daily as needed for allergies. 12/08/19  Yes McLean-Scocuzza, Nino Glow, MD  omeprazole (PRILOSEC) 20 MG capsule Take 1 capsule (20 mg total) by mouth daily. 02/01/19  Yes Einar Pheasant, MD  rosuvastatin (CRESTOR) 10 MG tablet TAKE 1 TABLET(10 MG) BY MOUTH DAILY 02/22/20  Yes Einar Pheasant, MD  sodium chloride (OCEAN) 0.65 % SOLN nasal spray Place 2 sprays into both nostrils daily as needed for congestion. 12/08/19  Yes McLean-Scocuzza, Nino Glow, MD   Allergies  Allergen Reactions  . Norvasc [Amlodipine Besylate] Swelling  . Betadine [Povidone Iodine] Swelling  . Iodine Swelling    FAMILY HISTORY:  family history includes Breast cancer (age of onset: 79) in her maternal aunt; Breast cancer (age of onset: 71) in her paternal aunt; Colon cancer in her maternal grandfather and mother; Hypertension in her father. SOCIAL HISTORY:  reports that she has never smoked. She has never used smokeless tobacco. She reports that she does not drink alcohol and does not use drugs.  REVIEW OF SYSTEMS:   Unable to assess pt on continuous Bipap   SUBJECTIVE:  Pt nods yes when asked if her breathing has improved since she was placed on Bipap   VITAL SIGNS: Temp:  [  97.5 F (36.4 C)-98.5 F (36.9 C)] 98.3 F (36.8 C) (08/10 1600) Pulse Rate:  [68-94] 68 (08/10 1800) Resp:  [20-33] 31 (08/10 1800) BP: (114-145)/(61-83) 121/62 (08/10 1600) SpO2:  [87 %-97 %] 91 % (08/10 1800) FiO2 (%):  [94 %] 94 % (08/10 1403) Weight:  [111.1 kg] 111.1 kg (08/09 2343)  PHYSICAL EXAMINATION: General: acutely ill appearing female, NAD on Bipap  Neuro: alert, follows  commands HEENT: supple, no JVD  Cardiovascular: nsr, rrr, no R/G  Lungs: course/crackles throughout, even, non labored  Abdomen: +BS x4, obese, soft, non tender,non distended  Musculoskeletal: normal bulk and no tone, no edema  Skin: intact no rashes or lesions present   Recent Labs  Lab 03/28/2020 0821 03/08/20 0719  NA 138 139  K 3.6 3.5  CL 101 104  CO2 24 23  BUN 7 12  CREATININE 0.81 0.66  GLUCOSE 170* 218*   Recent Labs  Lab 03/16/2020 0821 03/08/20 0719  HGB 12.1 10.1*  HCT 36.6 31.9*  WBC 5.7 3.6*  PLT 207 211   DG Chest 2 View  Result Date: 03/11/2020 CLINICAL DATA:  Shortness of breath and weakness, history of COVID-19 positivity EXAM: CHEST - 2 VIEW COMPARISON:  10/06/2015 FINDINGS: Cardiac shadow is within normal limits. The overall inspiratory effort is poor. Patchy airspace opacities are identified throughout both lungs. No sizable effusion is noted. No bony abnormality is seen. IMPRESSION: Diffuse patchy multifocal pneumonia consistent with the given clinical history of COVID-19 positivity. Electronically Signed   By: Inez Catalina M.D.   On: 03/28/2020 08:47   CT ANGIO CHEST PE W OR WO CONTRAST  Result Date: 03/08/2020 CLINICAL DATA:  Shortness of breath with positive D-dimer study. EXAM: CT ANGIOGRAPHY CHEST WITH CONTRAST TECHNIQUE: Multidetector CT imaging of the chest was performed using the standard protocol during bolus administration of intravenous contrast. Multiplanar CT image reconstructions and MIPs were obtained to evaluate the vascular anatomy. CONTRAST:  75mL OMNIPAQUE IOHEXOL 350 MG/ML SOLN COMPARISON:  March 07, 2020 FINDINGS: Cardiovascular: There is no demonstrable pulmonary embolus. There is no thoracic aortic aneurysm or dissection. The visualized great vessels appear unremarkable. The right innominate and left common carotid arteries arise as a common trunk, an anatomic variant. No pericardial effusion or pericardial thickening evident.  Mediastinum/Nodes: There is an 8 mm nodular opacity in the right lobe of the thyroid which per consensus guidelines does not warrant additional imaging surveillance. There are occasional subcentimeter mediastinal lymph nodes. There is a lymph node to the right of the distal trachea measuring 1.2 x 1.1 cm. There is a lymph node in the aortopulmonary window region measuring 1.1 x 1.1 cm. No esophageal lesions are evident. Lungs/Pleura: There is widespread airspace opacity throughout the lungs diffusely involving to varying degrees all lobes and segments. No pleural effusions evident. Upper Abdomen: Visualized upper abdominal structures appear unremarkable. Musculoskeletal: No blastic or lytic bone lesions. No evident chest wall lesions. Review of the MIP images confirms the above findings. IMPRESSION: 1. No demonstrable pulmonary embolus. No thoracic aortic aneurysm or dissection. 2. Widespread airspace opacity bilaterally consistent with multifocal pneumonia throughout the lungs. Suspect atypical organism pneumonia. 3. Mildly prominent right pretracheal and aortopulmonary window lymph nodes, likely of reactive etiology given the extensive parenchymal lung abnormalities. Electronically Signed   By: Lowella Grip III M.D.   On: 03/08/2020 14:11    ASSESSMENT / PLAN:  Acute hypoxic respiratory failure secondary to COVID-19 pneumonia  Prn Bipap or supplemental O2 for dyspnea and/or hypoxia  Instruct patient to: prone for 2-3 hours every 12 hours x 2 days Continue iv steroids, vitamins, and remdesivir  Trend inflammatory markers  Continue iv lasix  Trend BMP  Monitor UOP Scheduled and prn bronchodilator therapy  Aggressive pulmonary hygiene   Anemia without obvious acute blood loss VTE px: will consult pharmacy to adjust subq lovenox dosing due to COVID-19 dx  Trend CBC  Monitor for s/sx of bleeding and transfuse for hgb <7  Newly diagnosed type II diabetes mellitis  CBG's ac/hs SSI  Diabetes  coordinator consulted appreciate input   Best Practice: VTE px: subq lovenox SUP px: iv protonix  Code status: Full Code Updated pt regarding plan of care   Marda Stalker, [place] Pager [phone] (please enter 7 digits) PCCM Consult Pager [phone] (please enter 7 digits)

## 2020-03-08 NOTE — Progress Notes (Signed)
Inpatient Diabetes Program Recommendations  AACE/ADA: New Consensus Statement on Inpatient Glycemic Control   Target Ranges:  Prepandial:   less than 140 mg/dL      Peak postprandial:   less than 180 mg/dL (1-2 hours)      Critically ill patients:  140 - 180 mg/dL  Results for Kathleen Espinoza, Kathleen Espinoza (MRN [id number]) as of 03/08/2020 11:00  Ref. Range 03/16/2020 20:03 02/28/2020 21:43 03/05/2020 23:52 03/08/2020 07:54  Glucose-Capillary Latest Ref Range: 70 - 99 mg/dL 202 (H) 253 (H) 200 (H) 185 (H)   Results for Kathleen Espinoza, Kathleen Espinoza (MRN [id number]) as of 03/08/2020 11:00  Ref. Range 03/11/2020 08:21 03/08/2020 07:19  Glucose Latest Ref Range: 70 - 99 mg/dL 170 (H) 218 (H)   Results for Kathleen Espinoza, Kathleen Espinoza (MRN [id number]) as of 03/08/2020 11:00  Ref. Range 02/28/2017 15:30 09/15/2018 13:25 03/03/2020 20:14  Hemoglobin A1C Latest Ref Range: 4.8 - 5.6 % 6.0 (H) 6.5 (H) 7.3 (H)   Review of Glycemic Control  Diabetes history: No Outpatient Diabetes medications: NA Current orders for Inpatient glycemic control: Novolog 0-15 units TID with meals, Novolog 0-5 units QHS; Decadron 6 mg Q24H  Inpatient Diabetes Program Recommendations:    HbgA1C:  A1C 7.3% on 03/05/2020 indicating an average glucose of 163 mg/dl over the past 2-3 months. Per ADA, if A1C 6.5% or greater than criteria meet to dx with DM. MD, please indicate in chart if patient will be newly dx with DM2 this admission. If so, please inform patient and nursing staff so patient can be educated.  NOTE: Per chart review, noted patient's A1C has been elevated for several years (in prediabetes range). Noted A1C of 6.5% on 09/15/18 and office note by Dr. Nicki Reaper on 09/17/18 notes hyperglycemia history. Current A1C 7.3% on 03/04/2020. Made note above to ask if MD plans to dx with DM2 this admission. Noted patient had change in status this morning due to respiratory desaturation. If patient will be newly dx with DM2, please order consult for diabetes coordinator to follow up with patient  when appropriate.  Thanks, Barnie Alderman, RN, MSN, CDE Diabetes Coordinator Inpatient Diabetes Program [phone] (Team Pager from 8am to 5pm)

## 2020-03-08 NOTE — Plan of Care (Signed)
  Problem: Clinical Measurements: Goal: Ability to maintain clinical measurements within normal limits will improve Outcome: Progressing Goal: Will remain free from infection Outcome: Progressing   Problem: Activity: Goal: Risk for activity intolerance will decrease Outcome: Progressing   Problem: Nutrition: Goal: Adequate nutrition will be maintained Outcome: Progressing   Problem: Coping: Goal: Level of anxiety will decrease Outcome: Progressing   

## 2020-03-08 NOTE — Progress Notes (Signed)
PROGRESS NOTE    Kathleen Espinoza  BTD:[id number] DOB: September 02, 1970 DOA: 03/24/2020 PCP: Einar Pheasant, MD   Brief Narrative:   Kathleen Espinoza is a 49 y.o. female with medical history significant of hypertension and dyslipidemia came to ED with complaint of worsening generalized weakness and shortness of breath. Patient started cough and mild exertional dyspnea approximately 11 to 12 days ago, due to persistent symptoms and some worsening she was tested for Covid at [place], no documentation 7-8 days ago and it was positive.  Husband with a similar symptoms and also admitted last night. She was hypoxic on arrival, chest x-ray with bilateral infiltrate consistent with COVID-19 pneumonia, elevated inflammatory markers.  Initially requiring 4 to 6 L.  Deteriorated overnight, now on heated HFNC at 45L.  Subjective: Patient was feeling very lethargic and short of breath when seen today.  Saturating in high 80s on HF [place] and nonbreather.  Assessment & Plan:   Active Problems:   Pneumonia due to COVID-19 virus  Acute hypoxic respiratory failure secondary to COVID-19 pneumonia. Clinical symptoms, labs and imaging is consistent with COVID-19 pneumonia.  Elevated inflammatory markers.  Borderline elevation of AST and ALT.worsening hypoxia.  She was started on remdesivir and Decadron. -Give her one-time dose of Actemra. -Switch steroid with Solu-Medrol 60 mg twice daily. -Transfer her to stepdown for heated high flow-she was transferred to progressive unit as there was no bed availability in stepdown. -Continue to monitor inflammatory markers. -Prone patient as tolerated. -Continue with supportive care. -Recheck procalcitonin as it was barely positive.  Received a call from progressive unit that patient continued to desaturate on maximum setting of heated HFNC.  Case discussed with Dr. Laural Roes from pulmonology who advised ICU evaluation. -Check ABG -Place her on BiPAP -Diuresed her with Lasix 40  mg daily. -Give her 1 dose of ivermectin. -Called ICU for evaluation as she might need higher level of care.  New diagnosis of diabetes.  A1c elevated at 7.3.  CBG elevated. -Consult to diabetes coordinator. -Continue with SSI  Transaminitis.  Mildly elevated AST and ALT.no prior history of hep B or C. Mild improvement as compared to yesterday.  Hep C and hep B screening was negative. -Continue to monitor as patient is on remdesivir and also received a dose of Actemra.  Hypertension.  Blood pressure mildly elevated today. -Continue to monitor without home antihypertensives at this time as patient might deteriorate.  Hyperlipidemia. -Holding home dose of statin due to mildly elevated transaminitis.  Objective: Vitals:   03/08/20 0601 03/08/20 0757 03/08/20 1212 03/08/20 1403  BP: 124/73 134/67 (!) 145/70 114/61  Pulse: 71 74 94 77  Resp: 20 20 (!) 22 (!) 26  Temp: (!) 97.5 F (36.4 C) 97.8 F (36.6 C) 98 F (36.7 C) 98.5 F (36.9 C)  TempSrc: Oral Oral Axillary Oral  SpO2: 90% 90% (!) 88% 90%  Weight:      Height:        Intake/Output Summary (Last 24 hours) at 03/08/2020 1620 Last data filed at 03/08/2020 0300 Gross per 24 hour  Intake 1774.91 ml  Output --  Net 1774.91 ml   Filed Weights   03/28/2020 0812 03/26/2020 2343  Weight: 108 kg 111.1 kg    Examination:  General exam: Morbidly obese lady, mildly tachypneic and appears little anxious. Respiratory system: Mildly decreased air entry bilaterally.  No wheezing. Cardiovascular system: S1 & S2 heard, RRR. No JVD, murmurs, Gastrointestinal system: Soft, nontender, nondistended, bowel sounds positive. Central nervous system: Alert  and oriented. No focal neurological deficits. Extremities: No edema, no cyanosis, pulses intact and symmetrical. Skin: No rashes, lesions or ulcers Psychiatry: Judgement and insight appear normal. Mood & affect appropriate.    DVT prophylaxis: Lovenox Code Status: Full Family  Communication: Updated the husband as he is also admitted with COVID-19 pneumonia. Disposition Plan:  Status is: Inpatient  Remains inpatient appropriate because:Inpatient level of care appropriate due to severity of illness   Dispo: The patient is from: Home              Anticipated d/c is to: Home              Anticipated d/c date is: 3 days              Patient currently is not medically stable to d/c.  Consultants:   Pulmonary  Procedures:  Antimicrobials:   Data Reviewed: I have personally reviewed following labs and imaging studies  CBC: Recent Labs  Lab 03/18/2020 0821 03/08/20 0719  WBC 5.7 3.6*  NEUTROABS  --  2.6  HGB 12.1 10.1*  HCT 36.6 31.9*  MCV 77.4* 79.9*  PLT 207 354   Basic Metabolic Panel: Recent Labs  Lab 03/29/2020 0821 03/08/20 0719  NA 138 139  K 3.6 3.5  CL 101 104  CO2 24 23  GLUCOSE 170* 218*  BUN 7 12  CREATININE 0.81 0.66  CALCIUM 7.9* 7.8*  MG  --  2.0  PHOS  --  2.0*   GFR: Estimated Creatinine Clearance: 99.2 mL/min (by C-G formula based on SCr of 0.66 mg/dL). Liver Function Tests: Recent Labs  Lab 02/28/2020 0821 03/08/20 0719  AST 68* 57*  ALT 50* 46*  ALKPHOS 78 63  BILITOT 0.7 0.5  PROT 7.0 6.5  ALBUMIN 3.0* 2.6*   No results for input(s): LIPASE, AMYLASE in the last 168 hours. No results for input(s): AMMONIA in the last 168 hours. Coagulation Profile: No results for input(s): INR, PROTIME in the last 168 hours. Cardiac Enzymes: No results for input(s): CKTOTAL, CKMB, CKMBINDEX, TROPONINI in the last 168 hours. BNP (last 3 results) No results for input(s): PROBNP in the last 8760 hours. HbA1C: Recent Labs    03/14/2020 2014  HGBA1C 7.3*   CBG: Recent Labs  Lab 03/10/2020 2003 03/11/2020 2143 03/06/2020 2352 03/08/20 0754 03/08/20 1408  GLUCAP 202* 253* 200* 185* 195*   Lipid Profile: No results for input(s): CHOL, HDL, LDLCALC, TRIG, CHOLHDL, LDLDIRECT in the last 72 hours. Thyroid Function Tests: No  results for input(s): TSH, T4TOTAL, FREET4, T3FREE, THYROIDAB in the last 72 hours. Anemia Panel: Recent Labs    03/12/2020 1530 03/08/20 0719  FERRITIN 443* 347*   Sepsis Labs: Recent Labs  Lab 03/15/2020 2014  PROCALCITON 0.13    Recent Results (from the past 240 hour(s))  SARS Coronavirus 2 by RT PCR (hospital order, performed in [facility] hospital lab) Nasopharyngeal Nasopharyngeal Swab     Status: Abnormal   Collection Time: 03/23/2020  3:30 PM   Specimen: Nasopharyngeal Swab  Result Value Ref Range Status   SARS Coronavirus 2 POSITIVE (A) NEGATIVE Final    Comment: RESULT CALLED TO, READ BACK BY AND VERIFIED WITH: JENNIFER WHITLEY RN AT [id number] ON 03/08/2020 SNG (NOTE) SARS-CoV-2 target nucleic acids are DETECTED  SARS-CoV-2 RNA is generally detectable in upper respiratory specimens  during the acute phase of infection.  Positive results are indicative  of the presence of the identified virus, but do not rule out bacterial infection or  co-infection with other pathogens not detected by the test.  Clinical correlation with patient history and  other diagnostic information is necessary to determine patient infection status.  The expected result is negative.  Fact Sheet for Patients:   StrictlyIdeas.no   Fact Sheet for Healthcare Providers:   BankingDealers.co.za    This test is not yet approved or cleared by the [place] FDA and  has been authorized for detection and/or diagnosis of SARS-CoV-2 by FDA under an Emergency Use Authorization (EUA).  This EUA will remain in effect (meaning  this test can be used) for the duration of  the COVID-19 declaration under Section 564(b)(1) of the Act, 21 U.S.C. section 360-bbb-3(b)(1), unless the authorization is terminated or revoked sooner.  Performed at [facility], [address] [place], [place] [address]   Blood culture (routine x 2)     Status: None (Preliminary  result)   Collection Time: 03/05/2020  3:30 PM   Specimen: BLOOD  Result Value Ref Range Status   Specimen Description BLOOD BLOOD RIGHT ARM  Final   Special Requests   Final    BOTTLES DRAWN AEROBIC AND ANAEROBIC Blood Culture adequate volume   Culture   Final    NO GROWTH < 24 HOURS Performed at [facility], [address] [place], [place] [address]    Report Status PENDING  Incomplete  Blood culture (routine x 2)     Status: None (Preliminary result)   Collection Time: 03/23/2020  3:30 PM   Specimen: BLOOD  Result Value Ref Range Status   Specimen Description BLOOD BLOOD LEFT ARM  Final   Special Requests   Final    BOTTLES DRAWN AEROBIC AND ANAEROBIC Blood Culture adequate volume   Culture   Final    NO GROWTH < 24 HOURS Performed at [facility], [address] [place], [place] [address]    Report Status PENDING  Incomplete     Radiology Studies: DG Chest 2 View  Result Date: 03/05/2020 CLINICAL DATA:  Shortness of breath and weakness, history of COVID-19 positivity EXAM: CHEST - 2 VIEW COMPARISON:  10/06/2015 FINDINGS: Cardiac shadow is within normal limits. The overall inspiratory effort is poor. Patchy airspace opacities are identified throughout both lungs. No sizable effusion is noted. No bony abnormality is seen. IMPRESSION: Diffuse patchy multifocal pneumonia consistent with the given clinical history of COVID-19 positivity. Electronically Signed   By: Inez Catalina M.D.   On: 03/08/2020 08:47   CT ANGIO CHEST PE W OR WO CONTRAST  Result Date: 03/08/2020 CLINICAL DATA:  Shortness of breath with positive D-dimer study. EXAM: CT ANGIOGRAPHY CHEST WITH CONTRAST TECHNIQUE: Multidetector CT imaging of the chest was performed using the standard protocol during bolus administration of intravenous contrast. Multiplanar CT image reconstructions and MIPs were obtained to evaluate the vascular anatomy. CONTRAST:  4mL OMNIPAQUE IOHEXOL 350 MG/ML SOLN COMPARISON:   March 07, 2020 FINDINGS: Cardiovascular: There is no demonstrable pulmonary embolus. There is no thoracic aortic aneurysm or dissection. The visualized great vessels appear unremarkable. The right innominate and left common carotid arteries arise as a common trunk, an anatomic variant. No pericardial effusion or pericardial thickening evident. Mediastinum/Nodes: There is an 8 mm nodular opacity in the right lobe of the thyroid which per consensus guidelines does not warrant additional imaging surveillance. There are occasional subcentimeter mediastinal lymph nodes. There is a lymph node to the right of the distal trachea measuring 1.2 x 1.1 cm. There is a lymph node in  the aortopulmonary window region measuring 1.1 x 1.1 cm. No esophageal lesions are evident. Lungs/Pleura: There is widespread airspace opacity throughout the lungs diffusely involving to varying degrees all lobes and segments. No pleural effusions evident. Upper Abdomen: Visualized upper abdominal structures appear unremarkable. Musculoskeletal: No blastic or lytic bone lesions. No evident chest wall lesions. Review of the MIP images confirms the above findings. IMPRESSION: 1. No demonstrable pulmonary embolus. No thoracic aortic aneurysm or dissection. 2. Widespread airspace opacity bilaterally consistent with multifocal pneumonia throughout the lungs. Suspect atypical organism pneumonia. 3. Mildly prominent right pretracheal and aortopulmonary window lymph nodes, likely of reactive etiology given the extensive parenchymal lung abnormalities. Electronically Signed   By: Lowella Grip III M.D.   On: 03/08/2020 14:11    Scheduled Meds: . vitamin C  500 mg Oral Daily  . enoxaparin (LOVENOX) injection  40 mg Subcutaneous Q12H  . feeding supplement (NEPRO CARB STEADY)  237 mL Oral TID BM  . insulin aspart  0-15 Units Subcutaneous TID WC  . insulin aspart  0-5 Units Subcutaneous QHS  . Ipratropium-Albuterol  1 puff Inhalation Q6H  . mouth  rinse  15 mL Mouth Rinse BID  . methylPREDNISolone (SOLU-MEDROL) injection  60 mg Intravenous Q12H  . [START ON 03/09/2020] multivitamin with minerals  1 tablet Oral Daily  . pantoprazole  40 mg Oral Daily  . sodium chloride flush  3 mL Intravenous Q12H  . zinc sulfate  220 mg Oral Daily   Continuous Infusions: . remdesivir 100 mg in NS 100 mL 100 mg (03/08/20 0848)     LOS: 1 day   Time spent: 50 minutes.  Lorella Nimrod, MD [facility]  If 7PM-7AM, please contact night-coverage [email or web address]  03/08/2020, 4:20 PM   This record has been created using Systems analyst. Errors have been sought and corrected,but may not always be located. Such creation errors do not reflect on the standard of care.

## 2020-03-08 NOTE — Progress Notes (Signed)
ANTICOAGULATION CONSULT NOTE - Initial Consult  Pharmacy Consult for Lovenox  Indication: DVT prophylaxis in COVID pt  Allergies  Allergen Reactions  . Norvasc [Amlodipine Besylate] Swelling  . Betadine [Povidone Iodine] Swelling  . Iodine Swelling    Patient Measurements: Height: 5\' 1"  (154.9 cm) Weight: 111.1 kg (245 lb) IBW/kg (Calculated) : 47.8 Heparin Dosing Weight:   Vital Signs: Temp: 100.3 F (37.9 C) (08/10 2016) Temp Source: Axillary (08/10 2016) BP: 120/65 (08/10 2016) Pulse Rate: 69 (08/10 2016)  Labs: Recent Labs    03/25/2020 0821 03/06/2020 2014 03/29/2020 2314 03/08/20 0719  HGB 12.1  --   --  10.1*  HCT 36.6  --   --  31.9*  PLT 207  --   --  211  CREATININE 0.81  --   --  0.66  TROPONINIHS  --  17 12  --     Estimated Creatinine Clearance: 99.2 mL/min (by C-G formula based on SCr of 0.66 mg/dL).   Medical History: Past Medical History:  Diagnosis Date  . Abnormal pap    required freezing  . Hypercholesterolemia   . Hypertension   . Microscopic hematuria    s/p renal biopsy  . Thin basement membrane disease     Medications:  Medications Prior to Admission  Medication Sig Dispense Refill Last Dose  . fluticasone (FLONASE) 50 MCG/ACT nasal spray Place 2 sprays into both nostrils daily as needed for allergies or rhinitis. After nasal saline 16 g 2 PRN at PRN  . hydrochlorothiazide (MICROZIDE) 12.5 MG capsule TAKE 1 CAPSULE BY MOUTH  DAILY 90 capsule 3 03/06/2020 at Unknown time  . lisinopril (ZESTRIL) 40 MG tablet TAKE 1 TABLET BY MOUTH  DAILY 90 tablet 3 03/06/2020 at Unknown time  . loratadine (CLARITIN) 10 MG tablet Take 1 tablet (10 mg total) by mouth daily as needed for allergies. 90 tablet 3 03/06/2020 at Unknown time  . omeprazole (PRILOSEC) 20 MG capsule Take 1 capsule (20 mg total) by mouth daily. 30 capsule 2 03/06/2020 at Unknown time  . rosuvastatin (CRESTOR) 10 MG tablet TAKE 1 TABLET(10 MG) BY MOUTH DAILY 30 tablet 2 03/06/2020 at Unknown  time  . sodium chloride (OCEAN) 0.65 % SOLN nasal spray Place 2 sprays into both nostrils daily as needed for congestion. 30 mL 2 Past Week at PRN    Assessment: Pharmacy consulted to dose lovenox for DVT prophylaxis  in this 49 year old female with COVID.   Pt is being admitted to CCU. CrCl = 99.2 ml/min,  BMI = 46.3   Goal of Therapy:  prevention of DVT    Plan:  Pt received lovenox 40 mg SQ on 8/10 @ 0848 Will begin lovenox 55 mg (0.5mg /kg) SQ Q24H on 8/11 @ 1000  Kathleen Espinoza D 03/08/2020,8:50 PM

## 2020-03-08 NOTE — Progress Notes (Signed)
Pt. Transported to CCU on BIPAP.

## 2020-03-08 NOTE — Progress Notes (Addendum)
3870 Sats 65-75% on 15 L HF. applied NRB and prone patient called RT and notified charge nurse and MD amin. RT to bedside.MD placed transfer orders. Currently pt sats 89%. Educated pt on importance of keeping bag inflated on NRB. Pt verbalized understanding.

## 2020-03-08 NOTE — Progress Notes (Signed)
Pt arrived from room 233 transported by RN x3 and RT via bed for closer monitoring r/t worsening resp status throughout day. Pt on bipap 70% on arrival. Pt alert and oriented. VS taken, skin assessed. Assessment per flowsheet.

## 2020-03-08 NOTE — Progress Notes (Addendum)
Patient currently on heated high flow, oxygen saturation ranging anywhere from 80%-90%. More frequently in the lower 80s. Respiratory therapist and Dr. Reesa Chew made aware. Patient laying on her side and seems to do better like that, no signs of apparent distress except with exertion, she gets very short of breath. MD put in order for contious BiPAP and RT made aware.   Update 1756: MD made aware of patient not being able to take anything PO since she is currently on the BiPAP. Not safe to take the patient off of BiPAP as her sat's drop. So MD made aware I am holding that one time dose of PO Ivermectin.   Update 1816: MD put in orders to transfer patient to the ICU.  Spoke with daughter Herschel [place] and updated over the phone.   Update 1858: Spoke with daughter again and updated. No bed available yet in ICU.

## 2020-03-08 NOTE — Progress Notes (Signed)
Initial Nutrition Assessment  DOCUMENTATION CODES:   Morbid obesity  INTERVENTION:   Nepro Shake po TID, each supplement provides 425 kcal and 19 grams protein  MVI daily   Carbohydrate modified diet   Pt at high refeed risk; recommend monitor K, Mg and P labs daily as oral intake improves  NUTRITION DIAGNOSIS:   Increased nutrient needs related to catabolic illness (COVID 19) as evidenced by increased estimated needs.  GOAL:   Patient will meet greater than or equal to 90% of their needs  MONITOR:   PO intake, Supplement acceptance, Labs, Weight trends, Skin, I & O's  REASON FOR ASSESSMENT:   Consult Assessment of nutrition requirement/status  ASSESSMENT:   49 y.o. female with a past medical history of hypertension and hyperlipidemia who is admitted with COVID 19  RD working remotely.  Unable to reach pt by phone after multiple attempts. Per chart review, pt with poor appetite and oral intake. RD suspects pt with poor oral intake over the past 2 weeks r/t COVID 19. Per chart, pt appears fairly weight stable at baseline. RD will add supplements and MVI to help pt meet her estimated needs. Suspect pt is at refeed risk. RD will change pt over to a carbohydrate controlled diet as pt with hyperglycemia and elevated AIC.   Medications reviewed and include: vitamin C, lovenox, dexamethasone, insulin, protonix, zinc    Labs reviewed: K 3.5 wnl, P 2.0(L), Mg 2.0 wnl Hgb 10.1(L), Hct 31.9(L), MCV 79.9(L), MCH 25.3(L) cbgs- 202, 253, 200, 185 x 24 hrs AIC- 7.3(H)- 8/9  NUTRITION - FOCUSED PHYSICAL EXAM: Unable to perform at this time   Diet Order:   Diet Order            Diet Heart Room service appropriate? Yes; Fluid consistency: Thin  Diet effective now                EDUCATION NEEDS:   No education needs have been identified at this time  Skin:  Skin Assessment: Reviewed RN Assessment  Last BM:  PTA  Height:   Ht Readings from Last 1 Encounters:   03/16/2020 5\' 1"  (1.549 m)    Weight:   Wt Readings from Last 1 Encounters:  03/08/2020 111.1 kg    Ideal Body Weight:  47.7 kg  BMI:  Body mass index is 46.29 kg/m.  Estimated Nutritional Needs:   Kcal:  2300-2600kcal/day  Protein:  110-120g/day  Fluid:  1.5-1.7L/day  Koleen Distance MS, RD, LDN Please refer to [facility] for RD and/or RD on-call/weekend/after hours pager

## 2020-03-08 NOTE — Progress Notes (Signed)
Report called to [facility] in ICU

## 2020-03-09 DIAGNOSIS — J9601 Acute respiratory failure with hypoxia: Secondary | ICD-10-CM | POA: Diagnosis not present

## 2020-03-09 DIAGNOSIS — U071 COVID-19: Secondary | ICD-10-CM | POA: Diagnosis not present

## 2020-03-09 DIAGNOSIS — J1282 Pneumonia due to Coronavirus disease 2019: Secondary | ICD-10-CM | POA: Diagnosis not present

## 2020-03-09 LAB — CBC WITH DIFFERENTIAL/PLATELET
Abs Immature Granulocytes: 0.11 10*3/uL — ABNORMAL HIGH (ref 0.00–0.07)
Basophils Absolute: 0 10*3/uL (ref 0.0–0.1)
Basophils Relative: 0 %
Eosinophils Absolute: 0 10*3/uL (ref 0.0–0.5)
Eosinophils Relative: 0 %
HCT: 34.3 % — ABNORMAL LOW (ref 36.0–46.0)
Hemoglobin: 10.9 g/dL — ABNORMAL LOW (ref 12.0–15.0)
Immature Granulocytes: 1 %
Lymphocytes Relative: 8 %
Lymphs Abs: 0.8 10*3/uL (ref 0.7–4.0)
MCH: 25.6 pg — ABNORMAL LOW (ref 26.0–34.0)
MCHC: 31.8 g/dL (ref 30.0–36.0)
MCV: 80.5 fL (ref 80.0–100.0)
Monocytes Absolute: 0.8 10*3/uL (ref 0.1–1.0)
Monocytes Relative: 8 %
Neutro Abs: 7.8 10*3/uL — ABNORMAL HIGH (ref 1.7–7.7)
Neutrophils Relative %: 83 %
Platelets: 309 10*3/uL (ref 150–400)
RBC: 4.26 MIL/uL (ref 3.87–5.11)
RDW: 14.5 % (ref 11.5–15.5)
WBC: 9.5 10*3/uL (ref 4.0–10.5)
nRBC: 0 % (ref 0.0–0.2)

## 2020-03-09 LAB — COMPREHENSIVE METABOLIC PANEL
ALT: 50 U/L — ABNORMAL HIGH (ref 0–44)
AST: 55 U/L — ABNORMAL HIGH (ref 15–41)
Albumin: 2.9 g/dL — ABNORMAL LOW (ref 3.5–5.0)
Alkaline Phosphatase: 73 U/L (ref 38–126)
Anion gap: 13 (ref 5–15)
BUN: 22 mg/dL — ABNORMAL HIGH (ref 6–20)
CO2: 26 mmol/L (ref 22–32)
Calcium: 8.1 mg/dL — ABNORMAL LOW (ref 8.9–10.3)
Chloride: 106 mmol/L (ref 98–111)
Creatinine, Ser: 0.86 mg/dL (ref 0.44–1.00)
GFR calc Af Amer: >60 mL/min (ref >60)
GFR calc non Af Amer: >60 mL/min (ref >60)
Glucose, Bld: 252 mg/dL — ABNORMAL HIGH (ref 70–99)
Potassium: 3.5 mmol/L (ref 3.5–5.1)
Sodium: 145 mmol/L (ref 135–145)
Total Bilirubin: 0.5 mg/dL (ref 0.3–1.2)
Total Protein: 7 g/dL (ref 6.5–8.1)

## 2020-03-09 LAB — FERRITIN: Ferritin: 258 ng/mL (ref 11–307)

## 2020-03-09 LAB — GLUCOSE, CAPILLARY
Glucose-Capillary: 257 mg/dL — ABNORMAL HIGH (ref 70–99)
Glucose-Capillary: 251 mg/dL — ABNORMAL HIGH (ref 70–99)
Glucose-Capillary: 285 mg/dL — ABNORMAL HIGH (ref 70–99)
Glucose-Capillary: 216 mg/dL — ABNORMAL HIGH (ref 70–99)

## 2020-03-09 LAB — MRSA PCR SCREENING: MRSA by PCR: NEGATIVE

## 2020-03-09 LAB — C-REACTIVE PROTEIN: CRP: 9.1 mg/dL — ABNORMAL HIGH (ref <1.0)

## 2020-03-09 LAB — FIBRIN DERIVATIVES D-DIMER (ARMC ONLY): Fibrin derivatives D-dimer (ARMC): 1684.93 ng/mL (FEU) — ABNORMAL HIGH (ref 0.00–499.00)

## 2020-03-09 LAB — MAGNESIUM: Magnesium: 2 mg/dL (ref 1.7–2.4)

## 2020-03-09 LAB — PHOSPHORUS: Phosphorus: 2.9 mg/dL (ref 2.5–4.6)

## 2020-03-09 LAB — PROCALCITONIN: Procalcitonin: <0.1 ng/mL

## 2020-03-09 MED ORDER — LIVING WELL WITH DIABETES BOOK
Freq: Once | Status: AC
  Filled 2020-03-09: qty 1

## 2020-03-09 MED ORDER — IVERMECTIN 3 MG PO TABS
150.0000 ug/kg | ORAL_TABLET | Freq: Once | ORAL | Status: AC
  Administered 2020-03-11: 16500 ug via ORAL
  Filled 2020-03-09: qty 6

## 2020-03-09 MED ORDER — ENOXAPARIN SODIUM 60 MG/0.6ML ~~LOC~~ SOLN
55.0000 mg | Freq: Two times a day (BID) | SUBCUTANEOUS | Status: DC
  Administered 2020-03-09 – 2020-03-10 (×4): 55 mg via SUBCUTANEOUS
  Filled 2020-03-09 (×5): qty 0.6

## 2020-03-09 MED ORDER — POTASSIUM CHLORIDE CRYS ER 20 MEQ PO TBCR
40.0000 meq | EXTENDED_RELEASE_TABLET | Freq: Once | ORAL | Status: AC
  Administered 2020-03-09: 40 meq via ORAL
  Filled 2020-03-09: qty 2

## 2020-03-09 MED ORDER — LACTATED RINGERS IV SOLN: INTRAVENOUS | Status: DC

## 2020-03-09 MED ORDER — INSULIN DETEMIR 100 UNIT/ML ~~LOC~~ SOLN
6.0000 [IU] | Freq: Every day | SUBCUTANEOUS | Status: DC
  Administered 2020-03-09: 6 [IU] via SUBCUTANEOUS
  Filled 2020-03-09 (×2): qty 0.06

## 2020-03-09 NOTE — Progress Notes (Signed)
PHARMACY NOTE:  ANTICOAGULATION RENAL DOSAGE ADJUSTMENT  Current anticoagulation regimen includes a mismatch between anticoagulation dosage and estimated renal function.  As per policy approved by the Pharmacy & Therapeutics and Medical Executive Committees, the anticoagulation dosage will be adjusted accordingly.  Current anticoagulation dosage:  lovenox 55 mg daily   Indication: VTE prophylaxis in setting of COVID positive  Renal Function:  Estimated Creatinine Clearance: 92.3 mL/min (by C-G formula based on SCr of 0.86 mg/dL). []      On intermittent HD, scheduled: []      On CRRT    Anticoagulation dosage has been changed to:  lovenox 55 mg bid  Additional comments:   Thank you for allowing pharmacy to be a part of this patient's care.  Tobie Lords, PharmD, BCPS Clinical Pharmacist 03/09/2020 5:40 AM

## 2020-03-09 NOTE — Progress Notes (Signed)
Inpatient Diabetes Program Recommendations  AACE/ADA: New Consensus Statement on Inpatient Glycemic Control   Target Ranges:  Prepandial:   less than 140 mg/dL      Peak postprandial:   less than 180 mg/dL (1-2 hours)      Critically ill patients:  140 - 180 mg/dL  Results for Kathleen Espinoza, Kathleen Espinoza (MRN [id number]) as of 03/09/2020 07:44  Ref. Range 03/09/2020 04:39  Glucose Latest Ref Range: 70 - 99 mg/dL 252 (H)   Results for Kathleen Espinoza, Kathleen Espinoza (MRN [id number]) as of 03/09/2020 07:44  Ref. Range 03/08/2020 07:54 03/08/2020 14:08 03/08/2020 16:36 03/08/2020 21:00  Glucose-Capillary Latest Ref Range: 70 - 99 mg/dL 185 (H) 195 (H) 182 (H) 186 (H)  Results for Kathleen Espinoza, Kathleen Espinoza (MRN [id number]) as of 03/09/2020 07:44  Ref. Range 09/15/2018 13:25 03/29/2020 20:14  Hemoglobin A1C Latest Ref Range: 4.8 - 5.6 % 6.5 (H) 7.3 (H)   Review of Glycemic Control  Diabetes history: No Outpatient Diabetes medications: NA Current orders for Inpatient glycemic control: Novolog 0-15 units TID with meals, Novolog 0-5 units QHS; Solumedrol 60 mg Q12H Inpatient Diabetes Program Recommendations:    Insulin-If steroids are continued, please consider ordering Levemir 6 units Q24H.  HbgA1C:  A1C 7.3% on 03/12/2020 indicating an average glucose of 163 mg/dl over the past 2-3 months. MD notes patient being newly dx with DM this admission.  NOTE: Per chart review, noted patient's A1C has been elevated for several years (in prediabetes range). Noted A1C of 6.5% on 09/15/18 and office note by Dr. Nicki Reaper on 09/17/18 notes hyperglycemia history. Current A1C 7.3% on 03/08/2020 and patient being newly dx with DM this admission. Will order Living Well with DM book, RD consult, and patient education by bedside RNs.  Diabetes Coordinator will plan to talk with patient when appropriate.  Thanks, Barnie Alderman, RN, MSN, CDE Diabetes Coordinator Inpatient Diabetes Program [phone] (Team Pager from 8am to 5pm)

## 2020-03-09 NOTE — Progress Notes (Signed)
Patient remains AOx4.  UOP is large with blood tinge as patient is menstruating. Her B/P and HR remain WDL.  Her oxygen saturation is markedly well after patient switched form BiPAP to prone with HHFNC.  She is receiving 69ml LR.  She received her dose of viral medicine around 1000.  Spoke with the daughter who brought by some blankets and private affects.  Patient remains prone with saturations in the 99%.

## 2020-03-09 NOTE — Progress Notes (Signed)
Name: Kathleen Espinoza MRN: [id number] DOB: 06-Jul-1971     CONSULTATION DATE: 03/06/2020  Subjective and objective: Remains on a BiPAP, desaturates easily once off BiPAP.  No major events last night  PAST MEDICAL HISTORY :   has a past medical history of Abnormal pap, Hypercholesterolemia, Hypertension, Microscopic hematuria, and Thin basement membrane disease.  has a past surgical history that includes Tonsillectomy and Tubal ligation. Prior to Admission medications   Medication Sig Start Date End Date Taking? Authorizing Provider  fluticasone (FLONASE) 50 MCG/ACT nasal spray Place 2 sprays into both nostrils daily as needed for allergies or rhinitis. After nasal saline 12/08/19  Yes McLean-Scocuzza, Nino Glow, MD  hydrochlorothiazide (MICROZIDE) 12.5 MG capsule TAKE 1 CAPSULE BY MOUTH  DAILY 07/22/19  Yes Einar Pheasant, MD  lisinopril (ZESTRIL) 40 MG tablet TAKE 1 TABLET BY MOUTH  DAILY 09/01/19  Yes Einar Pheasant, MD  loratadine (CLARITIN) 10 MG tablet Take 1 tablet (10 mg total) by mouth daily as needed for allergies. 12/08/19  Yes McLean-Scocuzza, Nino Glow, MD  omeprazole (PRILOSEC) 20 MG capsule Take 1 capsule (20 mg total) by mouth daily. 02/01/19  Yes Einar Pheasant, MD  rosuvastatin (CRESTOR) 10 MG tablet TAKE 1 TABLET(10 MG) BY MOUTH DAILY 02/22/20  Yes Einar Pheasant, MD  sodium chloride (OCEAN) 0.65 % SOLN nasal spray Place 2 sprays into both nostrils daily as needed for congestion. 12/08/19  Yes McLean-Scocuzza, Nino Glow, MD   Allergies  Allergen Reactions  . Norvasc [Amlodipine Besylate] Swelling  . Betadine [Povidone Iodine] Swelling  . Iodine Swelling    FAMILY HISTORY:  family history includes Breast cancer (age of onset: 56) in her maternal aunt; Breast cancer (age of onset: 81) in her paternal aunt; Colon cancer in her maternal grandfather and mother; Hypertension in her father. SOCIAL HISTORY:  reports that she has never smoked. She has never used smokeless tobacco. She  reports that she does not drink alcohol and does not use drugs.  REVIEW OF SYSTEMS:   Unable to obtain due to critical illness      Estimated body mass index is 46.29 kg/m as calculated from the following:   Height as of this encounter: 5\' 1"  (1.549 m).   Weight as of this encounter: 111.1 kg.    VITAL SIGNS: Temp:  [98 F (36.7 C)-100.3 F (37.9 C)] 99.1 F (37.3 C) (08/11 0800) Pulse Rate:  [57-94] 66 (08/11 0900) Resp:  [20-33] 28 (08/11 0900) BP: (114-145)/(55-77) 134/70 (08/11 0800) SpO2:  [87 %-98 %] 92 % (08/11 0800) FiO2 (%):  [70 %-94 %] 70 % (08/11 0800)   I/O last 3 completed shifts: In: 1774.9 [I.V.:524.9; IV Piggyback:1250] Out: 1200 [Urine:1200] Total I/O In: 207.7 [I.V.:7.7; IV Piggyback:200] Out: -    SpO2: 92 % O2 Flow Rate (L/min): 45 L/min FiO2 (%): 70 %   Physical Examination:  Awake and oriented with no focal motor deficits Tolerating BiPAP, no distress.  Bilateral equal air entry and bibasilar posterior medium crackles S1 & S2 are audible with no murmur Benign abdomen with normal peristalsis No leg edema   CULTURE RESULTS   Recent Results (from the past 240 hour(s))  SARS Coronavirus 2 by RT PCR (hospital order, performed in [facility] hospital lab) Nasopharyngeal Nasopharyngeal Swab     Status: Abnormal   Collection Time: 03/25/2020  3:30 PM   Specimen: Nasopharyngeal Swab  Result Value Ref Range Status   SARS Coronavirus 2 POSITIVE (A) NEGATIVE Final    Comment: RESULT CALLED  TO, READ BACK BY AND VERIFIED WITH: JENNIFER WHITLEY RN AT 1950 ON 03/08/2020 SNG (NOTE) SARS-CoV-2 target nucleic acids are DETECTED  SARS-CoV-2 RNA is generally detectable in upper respiratory specimens  during the acute phase of infection.  Positive results are indicative  of the presence of the identified virus, but do not rule out bacterial infection or co-infection with other pathogens not detected by the test.  Clinical correlation with patient history  and  other diagnostic information is necessary to determine patient infection status.  The expected result is negative.  Fact Sheet for Patients:   StrictlyIdeas.no   Fact Sheet for Healthcare Providers:   BankingDealers.co.za    This test is not yet approved or cleared by the [place] FDA and  has been authorized for detection and/or diagnosis of SARS-CoV-2 by FDA under an Emergency Use Authorization (EUA).  This EUA will remain in effect (meaning  this test can be used) for the duration of  the COVID-19 declaration under Section 564(b)(1) of the Act, 21 U.S.C. section 360-bbb-3(b)(1), unless the authorization is terminated or revoked sooner.  Performed at [facility], [address] [place], [place] [address]   Blood culture (routine x 2)     Status: None (Preliminary result)   Collection Time: 03/13/2020  3:30 PM   Specimen: BLOOD  Result Value Ref Range Status   Specimen Description BLOOD BLOOD RIGHT ARM  Final   Special Requests   Final    BOTTLES DRAWN AEROBIC AND ANAEROBIC Blood Culture adequate volume   Culture   Final    NO GROWTH 2 DAYS Performed at [facility], [address] [place], [place] [address]    Report Status PENDING  Incomplete  Blood culture (routine x 2)     Status: None (Preliminary result)   Collection Time: 02/28/2020  3:30 PM   Specimen: BLOOD  Result Value Ref Range Status   Specimen Description BLOOD BLOOD LEFT ARM  Final   Special Requests   Final    BOTTLES DRAWN AEROBIC AND ANAEROBIC Blood Culture adequate volume   Culture   Final    NO GROWTH 2 DAYS Performed at [facility], [address], [place], [place] [address]    Report Status PENDING  Incomplete  MRSA PCR Screening     Status: None   Collection Time: 03/08/20  9:55 PM   Specimen: Nasal Mucosa; Nasopharyngeal  Result Value Ref Range Status   MRSA by PCR NEGATIVE NEGATIVE Final    Comment:          The GeneXpert MRSA Assay (FDA approved for NASAL specimens only), is one component of a comprehensive MRSA colonization surveillance program. It is not intended to diagnose MRSA infection nor to guide or monitor treatment for MRSA infections. Performed at [facility], [address] [place], [place] [address]           IMAGING    CT ANGIO CHEST PE W OR WO CONTRAST  Result Date: 03/08/2020 CLINICAL DATA:  Shortness of breath with positive D-dimer study. EXAM: CT ANGIOGRAPHY CHEST WITH CONTRAST TECHNIQUE: Multidetector CT imaging of the chest was performed using the standard protocol during bolus administration of intravenous contrast. Multiplanar CT image reconstructions and MIPs were obtained to evaluate the vascular anatomy. CONTRAST:  27mL OMNIPAQUE IOHEXOL 350 MG/ML SOLN COMPARISON:  March 07, 2020 FINDINGS: Cardiovascular: There is no demonstrable pulmonary embolus. There is no thoracic aortic aneurysm or dissection. The visualized great vessels appear unremarkable. The right  innominate and left common carotid arteries arise as a common trunk, an anatomic variant. No pericardial effusion or pericardial thickening evident. Mediastinum/Nodes: There is an 8 mm nodular opacity in the right lobe of the thyroid which per consensus guidelines does not warrant additional imaging surveillance. There are occasional subcentimeter mediastinal lymph nodes. There is a lymph node to the right of the distal trachea measuring 1.2 x 1.1 cm. There is a lymph node in the aortopulmonary window region measuring 1.1 x 1.1 cm. No esophageal lesions are evident. Lungs/Pleura: There is widespread airspace opacity throughout the lungs diffusely involving to varying degrees all lobes and segments. No pleural effusions evident. Upper Abdomen: Visualized upper abdominal structures appear unremarkable. Musculoskeletal: No blastic or lytic bone lesions. No evident chest wall lesions. Review of the MIP images  confirms the above findings. IMPRESSION: 1. No demonstrable pulmonary embolus. No thoracic aortic aneurysm or dissection. 2. Widespread airspace opacity bilaterally consistent with multifocal pneumonia throughout the lungs. Suspect atypical organism pneumonia. 3. Mildly prominent right pretracheal and aortopulmonary window lymph nodes, likely of reactive etiology given the extensive parenchymal lung abnormalities. Electronically Signed   By: Lowella Grip III M.D.   On: 03/08/2020 14:11     Assessment and plan: Acute respiratory failure tolerating BiPAP ARDS with PF of 78 on 90% FiO2 COVID-19 pneumonia.  Bilateral airspace disease D-dimer elevation with COVID-19 related hypercoagulable state.  PE was ruled out with CTA angio chest -Remdesivir, ivermectin and steroid -Therapeutic Lovenox because of progressively worsening respiratory status. -Monitor PF ratio  Prerenal azotemia with intravascular volume depletion. -Optimize volume status and monitor renal panel  Hyperglycemia -Glycemic control and taper of steroid as tolerated with improvement of respiratory status  Anemia -Keep hemoglobin more than 7 g/dL  DVT & GI prophylaxis.  Continue with supportive care.  Critical care time 35 minutes

## 2020-03-09 NOTE — Progress Notes (Addendum)
PROGRESS NOTE    Kathleen Espinoza  LFY:[id number] DOB: October 08, 1970 DOA: 03/29/2020 PCP: Einar Pheasant, MD   Brief Narrative:   Kathleen Espinoza is a 49 y.o. female with medical history significant of hypertension and dyslipidemia came to ED with complaint of worsening generalized weakness and shortness of breath. Patient started cough and mild exertional dyspnea approximately 11 to 12 days ago, due to persistent symptoms and some worsening she was tested for Covid at [facility], no documentation 7-8 days ago and it was positive.  Husband with a similar symptoms and also admitted last night. She was hypoxic on arrival, chest x-ray with bilateral infiltrate consistent with COVID-19 pneumonia, elevated inflammatory markers.  Initially requiring 4 to 6 L.  Deteriorated later on, now on BiPAP in stepdown.  Subjective: Patient was quite lethargic and feeling weak.  Still short of breath.  Cannot speak full sentences, seen with BiPAP.  Assessment & Plan:   Active Problems:   Pneumonia due to COVID-19 virus  Acute hypoxic respiratory failure secondary to COVID-19 pneumonia. Clinical symptoms, labs and imaging is consistent with COVID-19 pneumonia.  Elevated inflammatory markers, started trending down.  Borderline elevation of AST and ALT, stable at this time worsening hypoxia-now barely making up to 90% on BiPAP.  She was started on remdesivir and Decadron, also given ivermectin. -Give her one-time dose of Actemra-we will repeat tomorrow if shows no improvement. -Continue remdesivir-day 3 -Continue with Solu-Medrol 60 mg twice daily. -Continue to monitor inflammatory markers. -Prone patient as tolerated. -Continue with supportive care. -Repeat procalcitonin negative-there is no need for antibiotics at this time. -Continue Lasix 40 mg daily to see if that will help with suspected ARDS with COVID-19 pneumonia.  New diagnosis of diabetes.  A1c elevated at 7.3.  CBG elevated. -Consult to diabetes  coordinator. -Continue with SSI  Transaminitis.  Mildly elevated AST and ALT.no prior history of hep B or C. Hep C and hep B screening was negative.  Currently stable. -Continue to monitor as patient is on remdesivir and also received a dose of Actemra.  Hypertension.  Blood pressure within goal today. -Continue to monitor without home antihypertensives at this time as patient might deteriorate.  Hyperlipidemia. -Holding home dose of statin due to mildly elevated transaminitis.  Objective: Vitals:   03/09/20 0700 03/09/20 0800 03/09/20 0900 03/09/20 1000  BP: 120/67 134/70 124/64 120/65  Pulse: 65 62 66 64  Resp: (!) 25 (!) 25 (!) 28 (!) 25  Temp:  99.1 F (37.3 C)    TempSrc:  Axillary    SpO2: 92% 92% 91% 91%  Weight:      Height:        Intake/Output Summary (Last 24 hours) at 03/09/2020 1331 Last data filed at 03/09/2020 1000 Gross per 24 hour  Intake 301.38 ml  Output 2200 ml  Net -1898.62 ml   Filed Weights   03/24/2020 0812 03/12/2020 2343  Weight: 108 kg 111.1 kg    Examination:  General.  Morbidly obese lady, appears lethargic, in no acute distress.  With BiPAP  Pulmonary.  Bilateral scattered dry crackles. CV.  Regular rate and rhythm, no JVD, rub or murmur. Abdomen.  Soft, nontender, nondistended, BS positive. CNS.  Alert and oriented x3.  No focal neurologic deficit. Extremities.  No edema, no cyanosis, pulses intact and symmetrical. Psychiatry.  Judgment and insight appears normal.  DVT prophylaxis: Lovenox Code Status: Full Family Communication: Updated the husband as he is also admitted with COVID-19 pneumonia. Disposition Plan:  Status is:  Inpatient  Remains inpatient appropriate because:Inpatient level of care appropriate due to severity of illness   Dispo: The patient is from: Home              Anticipated d/c is to: Home              Anticipated d/c date is: 3 days              Patient currently is not medically stable to d/c.  Patient is  currently seriously ill and being managed in ICU.  Consultants:   Pulmonary  Procedures:  Antimicrobials:   Data Reviewed: I have personally reviewed following labs and imaging studies  CBC: Recent Labs  Lab 03/06/2020 0821 03/08/20 0719 03/09/20 0439  WBC 5.7 3.6* 9.5  NEUTROABS  --  2.6 7.8*  HGB 12.1 10.1* 10.9*  HCT 36.6 31.9* 34.3*  MCV 77.4* 79.9* 80.5  PLT 207 211 923   Basic Metabolic Panel: Recent Labs  Lab 02/29/2020 0821 03/08/20 0719 03/09/20 0439  NA 138 139 145  K 3.6 3.5 3.5  CL 101 104 106  CO2 24 23 26   GLUCOSE 170* 218* 252*  BUN 7 12 22*  CREATININE 0.81 0.66 0.86  CALCIUM 7.9* 7.8* 8.1*  MG  --  2.0 2.0  PHOS  --  2.0* 2.9   GFR: Estimated Creatinine Clearance: 92.3 mL/min (by C-G formula based on SCr of 0.86 mg/dL). Liver Function Tests: Recent Labs  Lab 03/29/2020 0821 03/08/20 0719 03/09/20 0439  AST 68* 57* 55*  ALT 50* 46* 50*  ALKPHOS 78 63 73  BILITOT 0.7 0.5 0.5  PROT 7.0 6.5 7.0  ALBUMIN 3.0* 2.6* 2.9*   No results for input(s): LIPASE, AMYLASE in the last 168 hours. No results for input(s): AMMONIA in the last 168 hours. Coagulation Profile: No results for input(s): INR, PROTIME in the last 168 hours. Cardiac Enzymes: No results for input(s): CKTOTAL, CKMB, CKMBINDEX, TROPONINI in the last 168 hours. BNP (last 3 results) No results for input(s): PROBNP in the last 8760 hours. HbA1C: Recent Labs    03/18/2020 2014  HGBA1C 7.3*   CBG: Recent Labs  Lab 03/13/2020 2352 03/08/20 0754 03/08/20 1408 03/08/20 1636 03/08/20 2100  GLUCAP 200* 185* 195* 182* 186*   Lipid Profile: No results for input(s): CHOL, HDL, LDLCALC, TRIG, CHOLHDL, LDLDIRECT in the last 72 hours. Thyroid Function Tests: No results for input(s): TSH, T4TOTAL, FREET4, T3FREE, THYROIDAB in the last 72 hours. Anemia Panel: Recent Labs    03/08/20 0719 03/09/20 0439  FERRITIN 347* 258   Sepsis Labs: Recent Labs  Lab 03/11/2020 2014 03/08/20 0719  03/09/20 0439  PROCALCITON 0.13 <0.10 <0.10    Recent Results (from the past 240 hour(s))  SARS Coronavirus 2 by RT PCR (hospital order, performed in [facility] hospital lab) Nasopharyngeal Nasopharyngeal Swab     Status: Abnormal   Collection Time: 03/19/2020  3:30 PM   Specimen: Nasopharyngeal Swab  Result Value Ref Range Status   SARS Coronavirus 2 POSITIVE (A) NEGATIVE Final    Comment: RESULT CALLED TO, READ BACK BY AND VERIFIED WITH: JENNIFER WHITLEY RN AT 3007 ON 03/04/2020 SNG (NOTE) SARS-CoV-2 target nucleic acids are DETECTED  SARS-CoV-2 RNA is generally detectable in upper respiratory specimens  during the acute phase of infection.  Positive results are indicative  of the presence of the identified virus, but do not rule out bacterial infection or co-infection with other pathogens not detected by the test.  Clinical correlation with  patient history and  other diagnostic information is necessary to determine patient infection status.  The expected result is negative.  Fact Sheet for Patients:   StrictlyIdeas.no   Fact Sheet for Healthcare Providers:   BankingDealers.co.za    This test is not yet approved or cleared by the [place] FDA and  has been authorized for detection and/or diagnosis of SARS-CoV-2 by FDA under an Emergency Use Authorization (EUA).  This EUA will remain in effect (meaning  this test can be used) for the duration of  the COVID-19 declaration under Section 564(b)(1) of the Act, 21 U.S.C. section 360-bbb-3(b)(1), unless the authorization is terminated or revoked sooner.  Performed at [facility], [address] [place], [place] [address]   Blood culture (routine x 2)     Status: None (Preliminary result)   Collection Time: 03/08/2020  3:30 PM   Specimen: BLOOD  Result Value Ref Range Status   Specimen Description BLOOD BLOOD RIGHT ARM  Final   Special Requests   Final    BOTTLES DRAWN  AEROBIC AND ANAEROBIC Blood Culture adequate volume   Culture   Final    NO GROWTH 2 DAYS Performed at [facility], [address] [place], [place] [address]    Report Status PENDING  Incomplete  Blood culture (routine x 2)     Status: None (Preliminary result)   Collection Time: 03/20/2020  3:30 PM   Specimen: BLOOD  Result Value Ref Range Status   Specimen Description BLOOD BLOOD LEFT ARM  Final   Special Requests   Final    BOTTLES DRAWN AEROBIC AND ANAEROBIC Blood Culture adequate volume   Culture   Final    NO GROWTH 2 DAYS Performed at [facility], [address] [place], [place] [address]    Report Status PENDING  Incomplete  MRSA PCR Screening     Status: None   Collection Time: 03/08/20  9:55 PM   Specimen: Nasal Mucosa; Nasopharyngeal  Result Value Ref Range Status   MRSA by PCR NEGATIVE NEGATIVE Final    Comment:        The GeneXpert MRSA Assay (FDA approved for NASAL specimens only), is one component of a comprehensive MRSA colonization surveillance program. It is not intended to diagnose MRSA infection nor to guide or monitor treatment for MRSA infections. Performed at [facility], [address] [place], [place] [address]      Radiology Studies: CT ANGIO CHEST PE W OR WO CONTRAST  Result Date: 03/08/2020 CLINICAL DATA:  Shortness of breath with positive D-dimer study. EXAM: CT ANGIOGRAPHY CHEST WITH CONTRAST TECHNIQUE: Multidetector CT imaging of the chest was performed using the standard protocol during bolus administration of intravenous contrast. Multiplanar CT image reconstructions and MIPs were obtained to evaluate the vascular anatomy. CONTRAST:  78mL OMNIPAQUE IOHEXOL 350 MG/ML SOLN COMPARISON:  March 07, 2020 FINDINGS: Cardiovascular: There is no demonstrable pulmonary embolus. There is no thoracic aortic aneurysm or dissection. The visualized great vessels appear unremarkable. The right innominate and left common carotid  arteries arise as a common trunk, an anatomic variant. No pericardial effusion or pericardial thickening evident. Mediastinum/Nodes: There is an 8 mm nodular opacity in the right lobe of the thyroid which per consensus guidelines does not warrant additional imaging surveillance. There are occasional subcentimeter mediastinal lymph nodes. There is a lymph node to the right of the distal trachea measuring 1.2 x 1.1 cm. There is a lymph node in the aortopulmonary window region measuring  1.1 x 1.1 cm. No esophageal lesions are evident. Lungs/Pleura: There is widespread airspace opacity throughout the lungs diffusely involving to varying degrees all lobes and segments. No pleural effusions evident. Upper Abdomen: Visualized upper abdominal structures appear unremarkable. Musculoskeletal: No blastic or lytic bone lesions. No evident chest wall lesions. Review of the MIP images confirms the above findings. IMPRESSION: 1. No demonstrable pulmonary embolus. No thoracic aortic aneurysm or dissection. 2. Widespread airspace opacity bilaterally consistent with multifocal pneumonia throughout the lungs. Suspect atypical organism pneumonia. 3. Mildly prominent right pretracheal and aortopulmonary window lymph nodes, likely of reactive etiology given the extensive parenchymal lung abnormalities. Electronically Signed   By: Lowella Grip III M.D.   On: 03/08/2020 14:11    Scheduled Meds:  vitamin C  500 mg Oral Daily   Chlorhexidine Gluconate Cloth  6 each Topical Daily   enoxaparin (LOVENOX) injection  55 mg Subcutaneous Q12H   feeding supplement (NEPRO CARB STEADY)  237 mL Oral TID BM   furosemide  40 mg Intravenous Daily   insulin aspart  0-15 Units Subcutaneous TID WC   insulin aspart  0-5 Units Subcutaneous QHS   insulin detemir  6 Units Subcutaneous Daily   Ipratropium-Albuterol  1 puff Inhalation Q6H   [START ON 03/11/2020] ivermectin  150 mcg/kg Oral Once   living well with diabetes book   Does  not apply Once   mouth rinse  15 mL Mouth Rinse BID   methylPREDNISolone (SOLU-MEDROL) injection  60 mg Intravenous Q12H   multivitamin with minerals  1 tablet Oral Daily   pantoprazole (PROTONIX) IV  40 mg Intravenous Q24H   sodium chloride flush  3 mL Intravenous Q12H   zinc sulfate  220 mg Oral Daily   Continuous Infusions:  lactated ringers 75 mL/hr at 03/09/20 1000   remdesivir 100 mg in NS 100 mL Stopped (03/09/20 0912)     LOS: 2 days   Time spent: 45 minutes.  Lorella Nimrod, MD [facility]  If 7PM-7AM, please contact night-coverage [email or web address]  03/09/2020, 1:31 PM   This record has been created using Systems analyst. Errors have been sought and corrected,but may not always be located. Such creation errors do not reflect on the standard of care.

## 2020-03-09 NOTE — Progress Notes (Signed)
Nutrition Brief Note  RD received consult for diabetes diet education. Pt is not appropriate for education at this time. RD will attempt to provide diabetes diet education to pt prior to discharge. RD is already following this patient.   Koleen Distance MS, RD, LDN Please refer to [facility] for RD and/or RD on-call/weekend/after hours pager

## 2020-03-10 ENCOUNTER — Inpatient Hospital Stay: Payer: Managed Care, Other (non HMO)

## 2020-03-10 DIAGNOSIS — J1282 Pneumonia due to Coronavirus disease 2019: Secondary | ICD-10-CM | POA: Diagnosis not present

## 2020-03-10 DIAGNOSIS — U071 COVID-19: Secondary | ICD-10-CM | POA: Diagnosis not present

## 2020-03-10 DIAGNOSIS — J9601 Acute respiratory failure with hypoxia: Secondary | ICD-10-CM | POA: Diagnosis not present

## 2020-03-10 LAB — CBC WITH DIFFERENTIAL/PLATELET
Abs Immature Granulocytes: 0.21 10*3/uL — ABNORMAL HIGH (ref 0.00–0.07)
Basophils Absolute: 0 10*3/uL (ref 0.0–0.1)
Basophils Relative: 0 %
Eosinophils Absolute: 0 10*3/uL (ref 0.0–0.5)
Eosinophils Relative: 0 %
HCT: 33.2 % — ABNORMAL LOW (ref 36.0–46.0)
Hemoglobin: 10.4 g/dL — ABNORMAL LOW (ref 12.0–15.0)
Immature Granulocytes: 2 %
Lymphocytes Relative: 5 %
Lymphs Abs: 0.7 10*3/uL (ref 0.7–4.0)
MCH: 25.3 pg — ABNORMAL LOW (ref 26.0–34.0)
MCHC: 31.3 g/dL (ref 30.0–36.0)
MCV: 80.8 fL (ref 80.0–100.0)
Monocytes Absolute: 0.9 10*3/uL (ref 0.1–1.0)
Monocytes Relative: 6 %
Neutro Abs: 12.4 10*3/uL — ABNORMAL HIGH (ref 1.7–7.7)
Neutrophils Relative %: 87 %
Platelets: 350 10*3/uL (ref 150–400)
RBC: 4.11 MIL/uL (ref 3.87–5.11)
RDW: 14.2 % (ref 11.5–15.5)
WBC: 14.2 10*3/uL — ABNORMAL HIGH (ref 4.0–10.5)
nRBC: 0 % (ref 0.0–0.2)

## 2020-03-10 LAB — COMPREHENSIVE METABOLIC PANEL
ALT: 45 U/L — ABNORMAL HIGH (ref 0–44)
AST: 38 U/L (ref 15–41)
Albumin: 2.7 g/dL — ABNORMAL LOW (ref 3.5–5.0)
Alkaline Phosphatase: 80 U/L (ref 38–126)
Anion gap: 12 (ref 5–15)
BUN: 26 mg/dL — ABNORMAL HIGH (ref 6–20)
CO2: 29 mmol/L (ref 22–32)
Calcium: 8.2 mg/dL — ABNORMAL LOW (ref 8.9–10.3)
Chloride: 106 mmol/L (ref 98–111)
Creatinine, Ser: 0.81 mg/dL (ref 0.44–1.00)
GFR calc Af Amer: >60 mL/min (ref >60)
GFR calc non Af Amer: >60 mL/min (ref >60)
Glucose, Bld: 247 mg/dL — ABNORMAL HIGH (ref 70–99)
Potassium: 3.8 mmol/L (ref 3.5–5.1)
Sodium: 147 mmol/L — ABNORMAL HIGH (ref 135–145)
Total Bilirubin: 0.5 mg/dL (ref 0.3–1.2)
Total Protein: 6.3 g/dL — ABNORMAL LOW (ref 6.5–8.1)

## 2020-03-10 LAB — FIBRIN DERIVATIVES D-DIMER (ARMC ONLY): Fibrin derivatives D-dimer (ARMC): 1258.76 ng/mL (FEU) — ABNORMAL HIGH (ref 0.00–499.00)

## 2020-03-10 LAB — GLUCOSE, CAPILLARY
Glucose-Capillary: 234 mg/dL — ABNORMAL HIGH (ref 70–99)
Glucose-Capillary: 251 mg/dL — ABNORMAL HIGH (ref 70–99)
Glucose-Capillary: 350 mg/dL — ABNORMAL HIGH (ref 70–99)
Glucose-Capillary: 203 mg/dL — ABNORMAL HIGH (ref 70–99)

## 2020-03-10 LAB — PROCALCITONIN: Procalcitonin: <0.1 ng/mL

## 2020-03-10 LAB — FERRITIN: Ferritin: 183 ng/mL (ref 11–307)

## 2020-03-10 LAB — C-REACTIVE PROTEIN: CRP: 4.5 mg/dL — ABNORMAL HIGH (ref <1.0)

## 2020-03-10 LAB — PHOSPHORUS: Phosphorus: 3.6 mg/dL (ref 2.5–4.6)

## 2020-03-10 LAB — MAGNESIUM: Magnesium: 2.3 mg/dL (ref 1.7–2.4)

## 2020-03-10 MED ORDER — ENOXAPARIN SODIUM 120 MG/0.8ML ~~LOC~~ SOLN
1.0000 mg/kg | Freq: Two times a day (BID) | SUBCUTANEOUS | Status: DC
  Filled 2020-03-10 (×2): qty 0.8

## 2020-03-10 MED ORDER — ALPRAZOLAM 0.5 MG PO TABS
0.5000 mg | ORAL_TABLET | Freq: Two times a day (BID) | ORAL | Status: DC | PRN
  Administered 2020-03-10 – 2020-03-12 (×4): 0.5 mg via ORAL
  Filled 2020-03-10 (×4): qty 1

## 2020-03-10 MED ORDER — INSULIN DETEMIR 100 UNIT/ML ~~LOC~~ SOLN
6.0000 [IU] | Freq: Two times a day (BID) | SUBCUTANEOUS | Status: DC
  Administered 2020-03-10 – 2020-03-11 (×4): 6 [IU] via SUBCUTANEOUS
  Filled 2020-03-10 (×6): qty 0.06

## 2020-03-10 MED ORDER — SODIUM CHLORIDE 0.9 % IV SOLN
2.0000 g | INTRAVENOUS | Status: DC
  Administered 2020-03-10 – 2020-03-15 (×6): 2 g via INTRAVENOUS
  Filled 2020-03-10 (×3): qty 2
  Filled 2020-03-10: qty 20
  Filled 2020-03-10: qty 2
  Filled 2020-03-10: qty 20
  Filled 2020-03-10: qty 2

## 2020-03-10 MED ORDER — SODIUM CHLORIDE 0.9% FLUSH
10.0000 mL | Freq: Two times a day (BID) | INTRAVENOUS | Status: DC
  Administered 2020-03-10 – 2020-03-16 (×14): 10 mL

## 2020-03-10 MED ORDER — TOCILIZUMAB 200 MG/10ML IV SOLN
800.0000 mg | Freq: Once | INTRAVENOUS | Status: AC
  Administered 2020-03-10: 800 mg via INTRAVENOUS
  Filled 2020-03-10: qty 40

## 2020-03-10 MED ORDER — INSULIN ASPART 100 UNIT/ML ~~LOC~~ SOLN
4.0000 [IU] | Freq: Three times a day (TID) | SUBCUTANEOUS | Status: DC
  Administered 2020-03-10 – 2020-03-11 (×5): 4 [IU] via SUBCUTANEOUS
  Filled 2020-03-10 (×5): qty 1

## 2020-03-10 MED ORDER — ENOXAPARIN SODIUM 120 MG/0.8ML ~~LOC~~ SOLN
110.0000 mg | Freq: Two times a day (BID) | SUBCUTANEOUS | Status: DC
  Administered 2020-03-11 – 2020-03-14 (×7): 110 mg via SUBCUTANEOUS
  Filled 2020-03-10 (×9): qty 0.8

## 2020-03-10 MED ORDER — SODIUM CHLORIDE 0.9% FLUSH: 10.0000 mL | INTRAVENOUS | Status: DC | PRN

## 2020-03-10 MED ORDER — ATROPINE SULFATE 1 MG/10ML IJ SOSY
1.0000 mg | PREFILLED_SYRINGE | INTRAMUSCULAR | Status: DC | PRN
  Filled 2020-03-10: qty 10

## 2020-03-10 MED ORDER — SODIUM CHLORIDE 0.9 % IV SOLN
500.0000 mg | INTRAVENOUS | Status: DC
  Administered 2020-03-10 – 2020-03-15 (×6): 500 mg via INTRAVENOUS
  Filled 2020-03-10 (×7): qty 500

## 2020-03-10 NOTE — Progress Notes (Signed)
Patient remains AOx4.  She has spent the majority of the shift proning from left to right side with heavy pillow support for her belly and arms.  Patient is doing well on the [place] saturating between 88-94%.  I helped her to sit up to eat her dinner and she quickly desaturated and I utilized the NRB mask at 16L for additional therapy support.  Spoke with patients daughter about the patients condition and the need for longer medical therapies.  Patient UOP was approx 1200cc today along with a very large incontinence episode.  Bedding was changed and purewick container changed.  Patient cleaned with CHG wipes.

## 2020-03-10 NOTE — Progress Notes (Signed)
PROGRESS NOTE    Kathleen Espinoza  YOV:[id number] DOB: Jun 28, 1971 DOA: 03/12/2020 PCP: Kathleen Pheasant, MD   Brief Narrative:   Kathleen Espinoza is a 49 y.o. female with medical history significant of hypertension and dyslipidemia came to ED with complaint of worsening generalized weakness and shortness of breath. Patient started cough and mild exertional dyspnea approximately 11 to 12 days ago, due to persistent symptoms and some worsening she was tested for Covid at [facility], no documentation 7-8 days ago and it was positive.  Husband with a similar symptoms and also admitted last night. She was hypoxic on arrival, chest x-ray with bilateral infiltrate consistent with COVID-19 pneumonia, elevated inflammatory markers.  Initially requiring 4 to 6 L.  Deteriorated later on, now on BiPAP in stepdown.  Subjective: Patient was feeling very lethargic when seen today.  When I ask how are you doing she said that I am still alive.  Assessment & Plan:   Active Problems:   Pneumonia due to COVID-19 virus  Acute hypoxic respiratory failure secondary to COVID-19 pneumonia. Clinical symptoms, labs and imaging is consistent with COVID-19 pneumonia.  Elevated inflammatory markers, started trending down.  Borderline elevation of AST and ALT, stable at this time worsening hypoxia-now barely making up to 90% on heated HFNC at 50 L. Repeat chest x-ray this morning with worsening of bilateral infiltrates.  She was started on remdesivir and Decadron, also given ivermectin. -Give her second dose of Actemra. -Continue remdesivir-day 4 -Continue with Solu-Medrol 60 mg twice daily. -Continue to monitor inflammatory markers. -Prone patient as tolerated. -Continue with supportive care. -She was started on ceftriaxone and azithromycin by a pulmonologist today. -Discontinue Lasix as she is becoming mildly hypernatremia, most likely secondary to hypovolemia.  New diagnosis of diabetes.  A1c elevated at 7.3.  CBG  elevated. -Consult to diabetes coordinator. -Continue with SSI -Add Levemir 6 units twice daily. -Add mealtime coverage with 4 units of NovoLog if she eats more than 50% of her meals  Transaminitis.  Mildly elevated AST and ALT.no prior history of hep B or C. Hep C and hep B screening was negative.  Currently stable. -Continue to monitor as patient is on remdesivir and also received a dose of Actemra.  Hypertension.  Blood pressure within goal today. -Continue to monitor without home antihypertensives at this time as patient might deteriorate.  Hyperlipidemia. -Holding home dose of statin due to mildly elevated transaminitis.  Objective: Vitals:   03/10/20 0400 03/10/20 0500 03/10/20 0600 03/10/20 0700  BP: (!) 119/50 114/63 (!) 115/51 (!) 124/53  Pulse: (!) 46 (!) 56 (!) 49 (!) 48  Resp: 16 16 (!) 25 20  Temp:      TempSrc:      SpO2: 96% 95% 90% 94%  Weight:      Height:        Intake/Output Summary (Last 24 hours) at 03/10/2020 1750 Last data filed at 03/10/2020 1400 Gross per 24 hour  Intake 781.12 ml  Output 1900 ml  Net -1118.88 ml   Filed Weights   03/13/2020 0812 03/03/2020 2343  Weight: 108 kg 111.1 kg    Examination:  General.  Well-developed lady, appears lethargic,in no acute distress. Pulmonary.  Bilateral scattered rhonchi with decreased air entry, normal respiratory effort. CV.  Regular rate and rhythm, no JVD, rub or murmur. Abdomen.  Soft, nontender, nondistended, BS positive. CNS.  Alert and oriented x3.  No focal neurologic deficit. Extremities.  No edema, no cyanosis, pulses intact and symmetrical. Psychiatry.  Judgment and insight appears normal.  DVT prophylaxis: Lovenox Code Status: Full Family Communication: Updated the husband as he is also admitted with COVID-19 pneumonia. Disposition Plan:  Status is: Inpatient  Remains inpatient appropriate because:Inpatient level of care appropriate due to severity of illness   Dispo: The patient is  from: Home              Anticipated d/c is to: Home              Anticipated d/c date is: 3 days              Patient currently is not medically stable to d/c.  Patient is currently seriously ill and being managed in ICU.  Consultants:   Pulmonary  Procedures:  Antimicrobials:  Ceftriaxone Azithromycin  Data Reviewed: I have personally reviewed following labs and imaging studies  CBC: Recent Labs  Lab 03/02/2020 0821 03/08/20 0719 03/09/20 0439 03/10/20 0300  WBC 5.7 3.6* 9.5 14.2*  NEUTROABS  --  2.6 7.8* 12.4*  HGB 12.1 10.1* 10.9* 10.4*  HCT 36.6 31.9* 34.3* 33.2*  MCV 77.4* 79.9* 80.5 80.8  PLT 207 211 309 626   Basic Metabolic Panel: Recent Labs  Lab 03/12/2020 0821 03/08/20 0719 03/09/20 0439 03/10/20 0300  NA 138 139 145 147*  K 3.6 3.5 3.5 3.8  CL 101 104 106 106  CO2 24 23 26 29   GLUCOSE 170* 218* 252* 247*  BUN 7 12 22* 26*  CREATININE 0.81 0.66 0.86 0.81  CALCIUM 7.9* 7.8* 8.1* 8.2*  MG  --  2.0 2.0 2.3  PHOS  --  2.0* 2.9 3.6   GFR: Estimated Creatinine Clearance: 98 mL/min (by C-G formula based on SCr of 0.81 mg/dL). Liver Function Tests: Recent Labs  Lab 03/24/2020 0821 03/08/20 0719 03/09/20 0439 03/10/20 0300  AST 68* 57* 55* 38  ALT 50* 46* 50* 45*  ALKPHOS 78 63 73 80  BILITOT 0.7 0.5 0.5 0.5  PROT 7.0 6.5 7.0 6.3*  ALBUMIN 3.0* 2.6* 2.9* 2.7*   No results for input(s): LIPASE, AMYLASE in the last 168 hours. No results for input(s): AMMONIA in the last 168 hours. Coagulation Profile: No results for input(s): INR, PROTIME in the last 168 hours. Cardiac Enzymes: No results for input(s): CKTOTAL, CKMB, CKMBINDEX, TROPONINI in the last 168 hours. BNP (last 3 results) No results for input(s): PROBNP in the last 8760 hours. HbA1C: Recent Labs    03/19/2020 2014  HGBA1C 7.3*   CBG: Recent Labs  Lab 03/09/20 1606 03/09/20 2204 03/10/20 0830 03/10/20 1116 03/10/20 1551  GLUCAP 285* 216* 234* 251* 350*   Lipid Profile: No  results for input(s): CHOL, HDL, LDLCALC, TRIG, CHOLHDL, LDLDIRECT in the last 72 hours. Thyroid Function Tests: No results for input(s): TSH, T4TOTAL, FREET4, T3FREE, THYROIDAB in the last 72 hours. Anemia Panel: Recent Labs    03/09/20 0439 03/10/20 0300  FERRITIN 258 183   Sepsis Labs: Recent Labs  Lab 03/01/2020 2014 03/08/20 0719 03/09/20 0439 03/10/20 0300  PROCALCITON 0.13 <0.10 <0.10 <0.10    Recent Results (from the past 240 hour(s))  SARS Coronavirus 2 by RT PCR (hospital order, performed in [facility] hospital lab) Nasopharyngeal Nasopharyngeal Swab     Status: Abnormal   Collection Time: 03/12/2020  3:30 PM   Specimen: Nasopharyngeal Swab  Result Value Ref Range Status   SARS Coronavirus 2 POSITIVE (A) NEGATIVE Final    Comment: RESULT CALLED TO, READ BACK BY AND VERIFIED WITH: Claretta Fraise RN  AT 2620 ON 03/16/2020 SNG (NOTE) SARS-CoV-2 target nucleic acids are DETECTED  SARS-CoV-2 RNA is generally detectable in upper respiratory specimens  during the acute phase of infection.  Positive results are indicative  of the presence of the identified virus, but do not rule out bacterial infection or co-infection with other pathogens not detected by the test.  Clinical correlation with patient history and  other diagnostic information is necessary to determine patient infection status.  The expected result is negative.  Fact Sheet for Patients:   StrictlyIdeas.no   Fact Sheet for Healthcare Providers:   BankingDealers.co.za    This test is not yet approved or cleared by the [place] FDA and  has been authorized for detection and/or diagnosis of SARS-CoV-2 by FDA under an Emergency Use Authorization (EUA).  This EUA will remain in effect (meaning  this test can be used) for the duration of  the COVID-19 declaration under Section 564(b)(1) of the Act, 21 U.S.C. section 360-bbb-3(b)(1), unless the authorization  is terminated or revoked sooner.  Performed at [facility], [address] [place], [place] [address]   Blood culture (routine x 2)     Status: None (Preliminary result)   Collection Time: 03/09/2020  3:30 PM   Specimen: BLOOD  Result Value Ref Range Status   Specimen Description BLOOD BLOOD RIGHT ARM  Final   Special Requests   Final    BOTTLES DRAWN AEROBIC AND ANAEROBIC Blood Culture adequate volume   Culture   Final    NO GROWTH 3 DAYS Performed at [facility], [address], [place], [place] [address]    Report Status PENDING  Incomplete  Blood culture (routine x 2)     Status: None (Preliminary result)   Collection Time: 03/16/2020  3:30 PM   Specimen: BLOOD  Result Value Ref Range Status   Specimen Description BLOOD BLOOD LEFT ARM  Final   Special Requests   Final    BOTTLES DRAWN AEROBIC AND ANAEROBIC Blood Culture adequate volume   Culture   Final    NO GROWTH 3 DAYS Performed at [facility], [address] [place], [place] [address]    Report Status PENDING  Incomplete  MRSA PCR Screening     Status: None   Collection Time: 03/08/20  9:55 PM   Specimen: Nasal Mucosa; Nasopharyngeal  Result Value Ref Range Status   MRSA by PCR NEGATIVE NEGATIVE Final    Comment:        The GeneXpert MRSA Assay (FDA approved for NASAL specimens only), is one component of a comprehensive MRSA colonization surveillance program. It is not intended to diagnose MRSA infection nor to guide or monitor treatment for MRSA infections. Performed at [facility], [address], [place], [place] [address]      Radiology Studies: [facility] Chest [place] 1 View  Result Date: 03/10/2020 CLINICAL DATA:  Pneumonia. EXAM: PORTABLE CHEST 1 VIEW COMPARISON:  03/15/2020 FINDINGS: Lungs are somewhat hypoinflated demonstrate interval worsening bilateral patchy airspace process likely multifocal pneumonia. No evidence of effusion. Cardiomediastinal silhouette and  remainder the exam is unchanged. IMPRESSION: Interval worsening bilateral airspace process likely multifocal pneumonia. Electronically Signed   By: Marin Olp M.D.   On: 03/10/2020 08:04    Scheduled Meds: . vitamin C  500 mg Oral Daily  . Chlorhexidine Gluconate Cloth  6 each Topical Daily  . enoxaparin (LOVENOX) injection  55 mg Subcutaneous Q12H  . feeding supplement (NEPRO CARB STEADY)  237 mL Oral  TID BM  . insulin aspart  0-15 Units Subcutaneous TID WC  . insulin aspart  0-5 Units Subcutaneous QHS  . insulin aspart  4 Units Subcutaneous TID WC  . insulin detemir  6 Units Subcutaneous BID  . Ipratropium-Albuterol  1 puff Inhalation Q6H  . [START ON 03/11/2020] ivermectin  150 mcg/kg Oral Once  . mouth rinse  15 mL Mouth Rinse BID  . methylPREDNISolone (SOLU-MEDROL) injection  60 mg Intravenous Q12H  . multivitamin with minerals  1 tablet Oral Daily  . pantoprazole (PROTONIX) IV  40 mg Intravenous Q24H  . sodium chloride flush  10-40 mL Intracatheter Q12H  . sodium chloride flush  3 mL Intravenous Q12H  . zinc sulfate  220 mg Oral Daily   Continuous Infusions: . cefTRIAXone (ROCEPHIN)  IV 2 g (03/10/20 1651)  . lactated ringers 50 mL/hr at 03/10/20 1324  . remdesivir 100 mg in NS 100 mL 100 mg (03/10/20 1028)     LOS: 3 days   Time spent: 40 minutes.  Lorella Nimrod, MD [facility]  If 7PM-7AM, please contact night-coverage [email or web address]  03/10/2020, 5:50 PM   This record has been created using Systems analyst. Errors have been sought and corrected,but may not always be located. Such creation errors do not reflect on the standard of care.

## 2020-03-10 NOTE — Progress Notes (Signed)
Inpatient Diabetes Program Recommendations  AACE/ADA: New Consensus Statement on Inpatient Glycemic Control   Target Ranges:  Prepandial:   less than 140 mg/dL      Peak postprandial:   less than 180 mg/dL (1-2 hours)      Critically ill patients:  140 - 180 mg/dL  Results for Kathleen Espinoza, Kathleen Espinoza (MRN [id number]) as of 03/10/2020 08:21  Ref. Range 03/10/2020 03:00  Glucose Latest Ref Range: 70 - 99 mg/dL 247 (H)   Results for Kathleen Espinoza, Kathleen Espinoza (MRN [id number]) as of 03/10/2020 08:21  Ref. Range 03/09/2020 08:16 03/09/2020 12:34 03/09/2020 16:06 03/09/2020 22:04  Glucose-Capillary Latest Ref Range: 70 - 99 mg/dL 257 (H) 251 (H) 285 (H) 216 (H)  Results for Kathleen Espinoza, Kathleen Espinoza (MRN [id number]) as of 03/10/2020 08:21  Ref. Range 09/15/2018 13:25 02/29/2020 20:14  Hemoglobin A1C Latest Ref Range: 4.8 - 5.6 % 6.5 (H) 7.3 (H)  Results for Kathleen Espinoza, Kathleen Espinoza (MRN [id number]) as of 03/10/2020 08:21  Ref. Range 10/07/2015 08:47 05/31/2016 10:58 02/28/2017 15:30  Hemoglobin A1C Latest Ref Range: 4.8 - 5.6 % 6.2 (H) 5.9 (H) 6.0 (H)   Review of Glycemic Control  Diabetes history:No Outpatient Diabetes medications:NA Current orders for Inpatient glycemic control:Levemir 6 units daily, Novolog 0-15 units TID with meals, Novolog 0-5 units QHS; Solumedrol 60 mg Q12H  Inpatient Diabetes Program Recommendations:  Insulin-If steroids are continued, please consider increasing Levemir to 6 units BID and ordering Novolog 4 units TID with meals for meal coverage if patient eats at least 50% of meals.  HbgA1C: A1C 7.3% on 08/11/2021indicating an average glucose of 163 mg/dl over the past 2-3 months. MD notes patient being newly dx with DM this admission.  NOTE: Per chart review, noted patient's A1C has been elevated for several years (in prediabetes range). Noted A1C of 6.5% on 09/15/18 and office note by Dr. Nicki Reaper on 09/17/18 notes hyperglycemia history. Current A1C 7.3% on 03/07/20 and patient being newly dx with DM this admission.  Living Well with DM book, RD consult, and patient education by bedside RNs have been ordered.  Diabetes Coordinator will plan to talk with patient when appropriate.  Thanks, Barnie Alderman, RN, MSN, CDE Diabetes Coordinator Inpatient Diabetes Program [phone] (Team Pager from 8am to 5pm)

## 2020-03-10 NOTE — Progress Notes (Signed)
Name: Kathleen Espinoza MRN: [id number] DOB: 02/23/1971     CONSULTATION DATE: 03/27/2020  Subjective and objective: Afebrile, tolerating BiPAP/high flow nasal cannula 85% 55 L/min in no distress.  PAST MEDICAL HISTORY :   has a past medical history of Abnormal pap, Hypercholesterolemia, Hypertension, Microscopic hematuria, and Thin basement membrane disease.  has a past surgical history that includes Tonsillectomy and Tubal ligation. Prior to Admission medications   Medication Sig Start Date End Date Taking? Authorizing Provider  fluticasone (FLONASE) 50 MCG/ACT nasal spray Place 2 sprays into both nostrils daily as needed for allergies or rhinitis. After nasal saline 12/08/19  Yes McLean-Scocuzza, Nino Glow, MD  hydrochlorothiazide (MICROZIDE) 12.5 MG capsule TAKE 1 CAPSULE BY MOUTH  DAILY 07/22/19  Yes Einar Pheasant, MD  lisinopril (ZESTRIL) 40 MG tablet TAKE 1 TABLET BY MOUTH  DAILY 09/01/19  Yes Einar Pheasant, MD  loratadine (CLARITIN) 10 MG tablet Take 1 tablet (10 mg total) by mouth daily as needed for allergies. 12/08/19  Yes McLean-Scocuzza, Nino Glow, MD  omeprazole (PRILOSEC) 20 MG capsule Take 1 capsule (20 mg total) by mouth daily. 02/01/19  Yes Einar Pheasant, MD  rosuvastatin (CRESTOR) 10 MG tablet TAKE 1 TABLET(10 MG) BY MOUTH DAILY 02/22/20  Yes Einar Pheasant, MD  sodium chloride (OCEAN) 0.65 % SOLN nasal spray Place 2 sprays into both nostrils daily as needed for congestion. 12/08/19  Yes McLean-Scocuzza, Nino Glow, MD   Allergies  Allergen Reactions  . Norvasc [Amlodipine Besylate] Swelling  . Betadine [Povidone Iodine] Swelling  . Iodine Swelling    FAMILY HISTORY:  family history includes Breast cancer (age of onset: 51) in her maternal aunt; Breast cancer (age of onset: 64) in her paternal aunt; Colon cancer in her maternal grandfather and mother; Hypertension in her father. SOCIAL HISTORY:  reports that she has never smoked. She has never used smokeless tobacco. She  reports that she does not drink alcohol and does not use drugs.  REVIEW OF SYSTEMS:   Unable to obtain due to critical illness      Estimated body mass index is 46.29 kg/m as calculated from the following:   Height as of this encounter: 5\' 1"  (1.549 m).   Weight as of this encounter: 111.1 kg.    VITAL SIGNS: Temp:  [97.5 F (36.4 C)-99.1 F (37.3 C)] 97.6 F (36.4 C) (08/12 0332) Pulse Rate:  [46-75] 48 (08/12 0700) Resp:  [14-26] 20 (08/12 0700) BP: (108-133)/(50-73) 124/53 (08/12 0700) SpO2:  [88 %-99 %] 94 % (08/12 0700) FiO2 (%):  [80 %-85 %] 85 % (08/12 0900)   I/O last 3 completed shifts: In: 1607.4 [I.V.:1364.3; IV Piggyback:243] Out: 3000 [Urine:3000] No intake/output data recorded.   SpO2: 94 % O2 Flow Rate (L/min): 55 L/min FiO2 (%): 85 %   Physical Examination:   Awake and oriented with no focal motor deficits Tolerating BiPAP, no distress.  Bilateral equal air entry and no rales S1 & S2 are audible with no murmur Benign abdomen with normal peristalsis No leg edema  CULTURE RESULTS   Recent Results (from the past 240 hour(s))  SARS Coronavirus 2 by RT PCR (hospital order, performed in [facility] hospital lab) Nasopharyngeal Nasopharyngeal Swab     Status: Abnormal   Collection Time: 03/01/2020  3:30 PM   Specimen: Nasopharyngeal Swab  Result Value Ref Range Status   SARS Coronavirus 2 POSITIVE (A) NEGATIVE Final    Comment: RESULT CALLED TO, READ BACK BY AND VERIFIED WITH: Claretta Fraise RN AT  1653 ON 03/24/2020 SNG (NOTE) SARS-CoV-2 target nucleic acids are DETECTED  SARS-CoV-2 RNA is generally detectable in upper respiratory specimens  during the acute phase of infection.  Positive results are indicative  of the presence of the identified virus, but do not rule out bacterial infection or co-infection with other pathogens not detected by the test.  Clinical correlation with patient history and  other diagnostic information is necessary to  determine patient infection status.  The expected result is negative.  Fact Sheet for Patients:   StrictlyIdeas.no   Fact Sheet for Healthcare Providers:   BankingDealers.co.za    This test is not yet approved or cleared by the [place] FDA and  has been authorized for detection and/or diagnosis of SARS-CoV-2 by FDA under an Emergency Use Authorization (EUA).  This EUA will remain in effect (meaning  this test can be used) for the duration of  the COVID-19 declaration under Section 564(b)(1) of the Act, 21 U.S.C. section 360-bbb-3(b)(1), unless the authorization is terminated or revoked sooner.  Performed at [facility], [address] [place], [place] [address]   Blood culture (routine x 2)     Status: None (Preliminary result)   Collection Time: 02/28/2020  3:30 PM   Specimen: BLOOD  Result Value Ref Range Status   Specimen Description BLOOD BLOOD RIGHT ARM  Final   Special Requests   Final    BOTTLES DRAWN AEROBIC AND ANAEROBIC Blood Culture adequate volume   Culture   Final    NO GROWTH 3 DAYS Performed at [facility], [address] [place], [place] [address]    Report Status PENDING  Incomplete  Blood culture (routine x 2)     Status: None (Preliminary result)   Collection Time: 03/20/2020  3:30 PM   Specimen: BLOOD  Result Value Ref Range Status   Specimen Description BLOOD BLOOD LEFT ARM  Final   Special Requests   Final    BOTTLES DRAWN AEROBIC AND ANAEROBIC Blood Culture adequate volume   Culture   Final    NO GROWTH 3 DAYS Performed at [facility], [address] [place], [place] [address]    Report Status PENDING  Incomplete  MRSA PCR Screening     Status: None   Collection Time: 03/08/20  9:55 PM   Specimen: Nasal Mucosa; Nasopharyngeal  Result Value Ref Range Status   MRSA by PCR NEGATIVE NEGATIVE Final    Comment:        The GeneXpert MRSA Assay (FDA approved for NASAL  specimens only), is one component of a comprehensive MRSA colonization surveillance program. It is not intended to diagnose MRSA infection nor to guide or monitor treatment for MRSA infections. Performed at [facility], [address] [place], [place] [address]           IMAGING    DG Chest Port 1 View  Result Date: 03/10/2020 CLINICAL DATA:  Pneumonia. EXAM: PORTABLE CHEST 1 VIEW COMPARISON:  03/16/2020 FINDINGS: Lungs are somewhat hypoinflated demonstrate interval worsening bilateral patchy airspace process likely multifocal pneumonia. No evidence of effusion. Cardiomediastinal silhouette and remainder the exam is unchanged. IMPRESSION: Interval worsening bilateral airspace process likely multifocal pneumonia. Electronically Signed   By: Marin Olp M.D.   On: 03/10/2020 08:04     Assessment and plan: Acute respiratory failure tolerating BiPAP ARDS with PF of 78 on 90% FiO2 COVID-19 pneumonia.  Bilateral airspace disease (worsening). MRSA PCR -ve D-dimer elevation with COVID-19 related hypercoagulable state.  PE was ruled out with CTA angio chest -Remdesivir, ivermectin and steroid -Therapeutic Lovenox because of progressively worsening respiratory status. -Monitor PF ratio -Empiric Rocephine  Prerenal azotemia with intravascular volume depletion. -Optimize volume status and monitor renal panel  Hyperglycemia -Glycemic control and taper of steroid as tolerated with improvement of respiratory status  Anemia -Keep hemoglobin more than 7 g/dL  DVT & GI prophylaxis.  Continue with supportive care.  Critical care time 35 min

## 2020-03-11 ENCOUNTER — Inpatient Hospital Stay: Payer: Managed Care, Other (non HMO)

## 2020-03-11 DIAGNOSIS — J1282 Pneumonia due to Coronavirus disease 2019: Secondary | ICD-10-CM | POA: Diagnosis not present

## 2020-03-11 DIAGNOSIS — J9601 Acute respiratory failure with hypoxia: Secondary | ICD-10-CM | POA: Diagnosis not present

## 2020-03-11 DIAGNOSIS — U071 COVID-19: Secondary | ICD-10-CM | POA: Diagnosis not present

## 2020-03-11 LAB — CBC WITH DIFFERENTIAL/PLATELET
Abs Immature Granulocytes: 0.25 10*3/uL — ABNORMAL HIGH (ref 0.00–0.07)
Basophils Absolute: 0 10*3/uL (ref 0.0–0.1)
Basophils Relative: 0 %
Eosinophils Absolute: 0 10*3/uL (ref 0.0–0.5)
Eosinophils Relative: 0 %
HCT: 34.7 % — ABNORMAL LOW (ref 36.0–46.0)
Hemoglobin: 11.4 g/dL — ABNORMAL LOW (ref 12.0–15.0)
Immature Granulocytes: 2 %
Lymphocytes Relative: 4 %
Lymphs Abs: 0.6 10*3/uL — ABNORMAL LOW (ref 0.7–4.0)
MCH: 25.6 pg — ABNORMAL LOW (ref 26.0–34.0)
MCHC: 32.9 g/dL (ref 30.0–36.0)
MCV: 78 fL — ABNORMAL LOW (ref 80.0–100.0)
Monocytes Absolute: 0.6 10*3/uL (ref 0.1–1.0)
Monocytes Relative: 4 %
Neutro Abs: 14 10*3/uL — ABNORMAL HIGH (ref 1.7–7.7)
Neutrophils Relative %: 90 %
Platelets: 410 10*3/uL — ABNORMAL HIGH (ref 150–400)
RBC: 4.45 MIL/uL (ref 3.87–5.11)
RDW: 13.6 % (ref 11.5–15.5)
WBC: 15.4 10*3/uL — ABNORMAL HIGH (ref 4.0–10.5)
nRBC: 0 % (ref 0.0–0.2)

## 2020-03-11 LAB — COMPREHENSIVE METABOLIC PANEL
ALT: 46 U/L — ABNORMAL HIGH (ref 0–44)
AST: 40 U/L (ref 15–41)
Albumin: 2.9 g/dL — ABNORMAL LOW (ref 3.5–5.0)
Alkaline Phosphatase: 120 U/L (ref 38–126)
Anion gap: 11 (ref 5–15)
BUN: 27 mg/dL — ABNORMAL HIGH (ref 6–20)
CO2: 30 mmol/L (ref 22–32)
Calcium: 8 mg/dL — ABNORMAL LOW (ref 8.9–10.3)
Chloride: 105 mmol/L (ref 98–111)
Creatinine, Ser: 0.84 mg/dL (ref 0.44–1.00)
GFR calc Af Amer: >60 mL/min (ref >60)
GFR calc non Af Amer: >60 mL/min (ref >60)
Glucose, Bld: 223 mg/dL — ABNORMAL HIGH (ref 70–99)
Potassium: 4.5 mmol/L (ref 3.5–5.1)
Sodium: 146 mmol/L — ABNORMAL HIGH (ref 135–145)
Total Bilirubin: 0.6 mg/dL (ref 0.3–1.2)
Total Protein: 6.7 g/dL (ref 6.5–8.1)

## 2020-03-11 LAB — GLUCOSE, CAPILLARY
Glucose-Capillary: 214 mg/dL — ABNORMAL HIGH (ref 70–99)
Glucose-Capillary: 224 mg/dL — ABNORMAL HIGH (ref 70–99)
Glucose-Capillary: 271 mg/dL — ABNORMAL HIGH (ref 70–99)
Glucose-Capillary: 159 mg/dL — ABNORMAL HIGH (ref 70–99)

## 2020-03-11 LAB — APTT: aPTT: 28 seconds (ref 24–36)

## 2020-03-11 LAB — BLOOD GAS, ARTERIAL
Acid-Base Excess: 8.2 mmol/L — ABNORMAL HIGH (ref 0.0–2.0)
Bicarbonate: 31.9 mmol/L — ABNORMAL HIGH (ref 20.0–28.0)
Expiratory PAP: 10
FIO2: 1
Inspiratory PAP: 15
Mode: POSITIVE
O2 Saturation: 98 %
Patient temperature: 37
RATE: 12 resp/min
pCO2 arterial: 40 mmHg (ref 32.0–48.0)
pH, Arterial: 7.51 — ABNORMAL HIGH (ref 7.350–7.450)
pO2, Arterial: 94 mmHg (ref 83.0–108.0)

## 2020-03-11 LAB — MAGNESIUM: Magnesium: 2.4 mg/dL (ref 1.7–2.4)

## 2020-03-11 LAB — FERRITIN: Ferritin: 187 ng/mL (ref 11–307)

## 2020-03-11 LAB — PROTIME-INR
INR: 1.1 (ref 0.8–1.2)
Prothrombin Time: 14.1 seconds (ref 11.4–15.2)

## 2020-03-11 LAB — C-REACTIVE PROTEIN: CRP: 2.8 mg/dL — ABNORMAL HIGH (ref <1.0)

## 2020-03-11 LAB — FIBRIN DERIVATIVES D-DIMER (ARMC ONLY): Fibrin derivatives D-dimer (ARMC): 1978.11 ng/mL (FEU) — ABNORMAL HIGH (ref 0.00–499.00)

## 2020-03-11 LAB — PHOSPHORUS: Phosphorus: 4.4 mg/dL (ref 2.5–4.6)

## 2020-03-11 MED ORDER — DEXAMETHASONE SODIUM PHOSPHATE 10 MG/ML IJ SOLN: 6.0000 mg | INTRAMUSCULAR | Status: DC

## 2020-03-11 MED ORDER — METHYLPREDNISOLONE SODIUM SUCC 40 MG IJ SOLR
40.0000 mg | Freq: Two times a day (BID) | INTRAMUSCULAR | Status: DC
  Administered 2020-03-11: 40 mg via INTRAVENOUS
  Filled 2020-03-11: qty 1

## 2020-03-11 MED ORDER — LISINOPRIL 20 MG PO TABS
20.0000 mg | ORAL_TABLET | Freq: Every day | ORAL | Status: DC
  Administered 2020-03-11: 20 mg via ORAL
  Filled 2020-03-11 (×3): qty 1

## 2020-03-11 NOTE — Progress Notes (Signed)
PROGRESS NOTE    Kathleen Espinoza  CMK:[id number] DOB: Dec 09, 1970 DOA: 03/14/2020 PCP: Einar Pheasant, MD   Brief Narrative:   Kathleen Espinoza is a 49 y.o. female with medical history significant of hypertension and dyslipidemia came to ED with complaint of worsening generalized weakness and shortness of breath. Patient started cough and mild exertional dyspnea approximately 11 to 12 days ago, due to persistent symptoms and some worsening she was tested for Covid at [place], no documentation 7-8 days ago and it was positive.  Husband with a similar symptoms and also admitted last night. She was hypoxic on arrival, chest x-ray with bilateral infiltrate consistent with COVID-19 pneumonia, elevated inflammatory markers.  Initially requiring 4 to 6 L.  Deteriorated later on, now on BiPAP in stepdown.  Subjective: Patient was feeling little better when seen today.  Stating that I am not going to give up.  She seems little more comfortable and saturating in high 90s on BiPAP.  Assessment & Plan:   Active Problems:   Pneumonia due to COVID-19 virus  Acute hypoxic respiratory failure secondary to COVID-19 pneumonia. Clinical symptoms, labs and imaging is consistent with COVID-19 pneumonia.  Elevated inflammatory markers, mild elevation in D-dimer with marked improvement in CRP. Borderline elevation of AST and ALT, stable at this time.  Saturating well in high 90s on BiPAP and appears little more comfortable today.  Received 2 doses of Actemra, last dose on 03/10/2020. She was started on remdesivir and Decadron, also given ivermectin. -Continue remdesivir-day 5-will complete the course today. -Continue with Solu-Medrol 40 mg twice daily. -Continue to monitor inflammatory markers. -Prone patient as tolerated. -Continue with supportive care. -She was started on ceftriaxone and azithromycin.  New diagnosis of diabetes.  A1c elevated at 7.3.  CBG elevated. -Consult to diabetes coordinator. -Continue  with SSI -Increase Levemir to 8 units twice daily. -Increase mealtime coverage to 6 units of NovoLog if she eats more than 50% of her meals  Transaminitis.  Mildly elevated AST and ALT.no prior history of hep B or C. Hep C and hep B screening was negative.  Currently stable. -Continue to monitor as patient is on remdesivir and also received two dose of Actemra.  Hypertension.  Blood pressure mildly elevated. Patient was on lisinopril and HCTZ at home. -Restart lisinopril at 20 mg daily instead of 40 and monitor-we can uptitrate if needed.  Hyperlipidemia. -Holding home dose of statin due to mildly elevated transaminitis.  Objective: Vitals:   03/11/20 1100 03/11/20 1200 03/11/20 1300 03/11/20 1400  BP: (!) 154/70 (!) 156/95 (!) 153/63 130/74  Pulse: (!) 50 (!) 48 (!) 47 67  Resp: 14 12 17  (!) 25  Temp:  97.6 F (36.4 C)    TempSrc:  Axillary    SpO2: 94% 94% 93% 96%  Weight:      Height:        Intake/Output Summary (Last 24 hours) at 03/11/2020 1507 Last data filed at 03/11/2020 1406 Gross per 24 hour  Intake 1584.24 ml  Output 675 ml  Net 909.24 ml   Filed Weights   03/18/2020 0812 03/04/2020 2343  Weight: 108 kg 111.1 kg    Examination:  General.  Well-developed lady, with BiPAP, in no acute distress. Pulmonary.  Lungs clear bilaterally, normal respiratory effort. CV.  Regular rate and rhythm, no JVD, rub or murmur. Abdomen.  Soft, nontender, nondistended, BS positive. CNS.  Alert and oriented x3.  No focal neurologic deficit. Extremities.  No edema, no cyanosis, pulses intact and  symmetrical. Psychiatry.  Judgment and insight appears normal.  DVT prophylaxis: Lovenox Code Status: Full Family Communication: Updated the husband in his room, he is being discharged today.  Disposition Plan:  Status is: Inpatient  Remains inpatient appropriate because:Inpatient level of care appropriate due to severity of illness   Dispo: The patient is from: Home               Anticipated d/c is to: Home              Anticipated d/c date is: 3 days              Patient currently is not medically stable to d/c.  Patient is currently seriously ill and being managed in ICU.  Consultants:   Pulmonary  Procedures:  Antimicrobials:  Ceftriaxone Azithromycin  Data Reviewed: I have personally reviewed following labs and imaging studies  CBC: Recent Labs  Lab 03/10/2020 0821 03/08/20 0719 03/09/20 0439 03/10/20 0300 03/11/20 0136  WBC 5.7 3.6* 9.5 14.2* 15.4*  NEUTROABS  --  2.6 7.8* 12.4* 14.0*  HGB 12.1 10.1* 10.9* 10.4* 11.4*  HCT 36.6 31.9* 34.3* 33.2* 34.7*  MCV 77.4* 79.9* 80.5 80.8 78.0*  PLT 207 211 309 350 623*   Basic Metabolic Panel: Recent Labs  Lab 03/15/2020 0821 03/08/20 0719 03/09/20 0439 03/10/20 0300 03/11/20 0136  NA 138 139 145 147* 146*  K 3.6 3.5 3.5 3.8 4.5  CL 101 104 106 106 105  CO2 24 23 26 29 30   GLUCOSE 170* 218* 252* 247* 223*  BUN 7 12 22* 26* 27*  CREATININE 0.81 0.66 0.86 0.81 0.84  CALCIUM 7.9* 7.8* 8.1* 8.2* 8.0*  MG  --  2.0 2.0 2.3 2.4  PHOS  --  2.0* 2.9 3.6 4.4   GFR: Estimated Creatinine Clearance: 94.5 mL/min (by C-G formula based on SCr of 0.84 mg/dL). Liver Function Tests: Recent Labs  Lab 03/19/2020 0821 03/08/20 0719 03/09/20 0439 03/10/20 0300 03/11/20 0136  AST 68* 57* 55* 38 40  ALT 50* 46* 50* 45* 46*  ALKPHOS 78 63 73 80 120  BILITOT 0.7 0.5 0.5 0.5 0.6  PROT 7.0 6.5 7.0 6.3* 6.7  ALBUMIN 3.0* 2.6* 2.9* 2.7* 2.9*   No results for input(s): LIPASE, AMYLASE in the last 168 hours. No results for input(s): AMMONIA in the last 168 hours. Coagulation Profile: Recent Labs  Lab 03/11/20 0136  INR 1.1   Cardiac Enzymes: No results for input(s): CKTOTAL, CKMB, CKMBINDEX, TROPONINI in the last 168 hours. BNP (last 3 results) No results for input(s): PROBNP in the last 8760 hours. HbA1C: No results for input(s): HGBA1C in the last 72 hours. CBG: Recent Labs  Lab 03/10/20 1116  03/10/20 1551 03/10/20 2102 03/11/20 0745 03/11/20 1202  GLUCAP 251* 350* 203* 214* 224*   Lipid Profile: No results for input(s): CHOL, HDL, LDLCALC, TRIG, CHOLHDL, LDLDIRECT in the last 72 hours. Thyroid Function Tests: No results for input(s): TSH, T4TOTAL, FREET4, T3FREE, THYROIDAB in the last 72 hours. Anemia Panel: Recent Labs    03/10/20 0300 03/11/20 0136  FERRITIN 183 187   Sepsis Labs: Recent Labs  Lab 03/06/2020 2014 03/08/20 0719 03/09/20 0439 03/10/20 0300  PROCALCITON 0.13 <0.10 <0.10 <0.10    Recent Results (from the past 240 hour(s))  SARS Coronavirus 2 by RT PCR (hospital order, performed in [facility] hospital lab) Nasopharyngeal Nasopharyngeal Swab     Status: Abnormal   Collection Time: 03/20/2020  3:30 PM   Specimen: Nasopharyngeal Swab  Result Value Ref Range Status   SARS Coronavirus 2 POSITIVE (A) NEGATIVE Final    Comment: RESULT CALLED TO, READ BACK BY AND VERIFIED WITH: JENNIFER WHITLEY RN AT 7654 ON 03/28/2020 SNG (NOTE) SARS-CoV-2 target nucleic acids are DETECTED  SARS-CoV-2 RNA is generally detectable in upper respiratory specimens  during the acute phase of infection.  Positive results are indicative  of the presence of the identified virus, but do not rule out bacterial infection or co-infection with other pathogens not detected by the test.  Clinical correlation with patient history and  other diagnostic information is necessary to determine patient infection status.  The expected result is negative.  Fact Sheet for Patients:   StrictlyIdeas.no   Fact Sheet for Healthcare Providers:   BankingDealers.co.za    This test is not yet approved or cleared by the [place] FDA and  has been authorized for detection and/or diagnosis of SARS-CoV-2 by FDA under an Emergency Use Authorization (EUA).  This EUA will remain in effect (meaning  this test can be used) for the duration of  the COVID-19  declaration under Section 564(b)(1) of the Act, 21 U.S.C. section 360-bbb-3(b)(1), unless the authorization is terminated or revoked sooner.  Performed at [facility], [address] [place], [place] [address]   Blood culture (routine x 2)     Status: None (Preliminary result)   Collection Time: 03/16/2020  3:30 PM   Specimen: BLOOD  Result Value Ref Range Status   Specimen Description BLOOD BLOOD RIGHT ARM  Final   Special Requests   Final    BOTTLES DRAWN AEROBIC AND ANAEROBIC Blood Culture adequate volume   Culture   Final    NO GROWTH 4 DAYS Performed at [facility] [address], [place], [place] [address]    Report Status PENDING  Incomplete  Blood culture (routine x 2)     Status: None (Preliminary result)   Collection Time: 03/06/2020  3:30 PM   Specimen: BLOOD  Result Value Ref Range Status   Specimen Description BLOOD BLOOD LEFT ARM  Final   Special Requests   Final    BOTTLES DRAWN AEROBIC AND ANAEROBIC Blood Culture adequate volume   Culture   Final    NO GROWTH 4 DAYS Performed at [facility], [address] [place], [place] [address]    Report Status PENDING  Incomplete  MRSA PCR Screening     Status: None   Collection Time: 03/08/20  9:55 PM   Specimen: Nasal Mucosa; Nasopharyngeal  Result Value Ref Range Status   MRSA by PCR NEGATIVE NEGATIVE Final    Comment:        The GeneXpert MRSA Assay (FDA approved for NASAL specimens only), is one component of a comprehensive MRSA colonization surveillance program. It is not intended to diagnose MRSA infection nor to guide or monitor treatment for MRSA infections. Performed at [facility], [address], [place], [place] [address]      Radiology Studies: DG Chest [place] 1 View  Result Date: 03/11/2020 CLINICAL DATA:  Follow-up pneumonia EXAM: PORTABLE CHEST 1 VIEW COMPARISON:  03/10/2020 FINDINGS: Cardiac shadow is stable. Lungs are well aerated bilaterally.  Diffuse multifocal airspace opacities are again identified stable from the prior exam. No bony abnormality is noted. IMPRESSION: No change from the previous day. Electronically Signed   By: Inez Catalina M.D.   On: 03/11/2020 08:06   DG Chest Port 1 View  Result Date: 03/10/2020 CLINICAL DATA:  Pneumonia.  EXAM: PORTABLE CHEST 1 VIEW COMPARISON:  03/03/2020 FINDINGS: Lungs are somewhat hypoinflated demonstrate interval worsening bilateral patchy airspace process likely multifocal pneumonia. No evidence of effusion. Cardiomediastinal silhouette and remainder the exam is unchanged. IMPRESSION: Interval worsening bilateral airspace process likely multifocal pneumonia. Electronically Signed   By: Marin Olp M.D.   On: 03/10/2020 08:04    Scheduled Meds: . vitamin C  500 mg Oral Daily  . Chlorhexidine Gluconate Cloth  6 each Topical Daily  . enoxaparin (LOVENOX) injection  110 mg Subcutaneous Q12H  . feeding supplement (NEPRO CARB STEADY)  237 mL Oral TID BM  . insulin aspart  0-15 Units Subcutaneous TID WC  . insulin aspart  0-5 Units Subcutaneous QHS  . insulin aspart  4 Units Subcutaneous TID WC  . insulin detemir  6 Units Subcutaneous BID  . Ipratropium-Albuterol  1 puff Inhalation Q6H  . lisinopril  20 mg Oral Daily  . mouth rinse  15 mL Mouth Rinse BID  . methylPREDNISolone (SOLU-MEDROL) injection  40 mg Intravenous Q12H  . multivitamin with minerals  1 tablet Oral Daily  . pantoprazole (PROTONIX) IV  40 mg Intravenous Q24H  . sodium chloride flush  10-40 mL Intracatheter Q12H  . sodium chloride flush  3 mL Intravenous Q12H  . zinc sulfate  220 mg Oral Daily   Continuous Infusions: . azithromycin Stopped (03/10/20 2258)  . cefTRIAXone (ROCEPHIN)  IV 200 mL/hr at 03/11/20 1406  . lactated ringers 50 mL/hr at 03/11/20 1406     LOS: 4 days   Time spent: 40 minutes.  Lorella Nimrod, MD [facility]  If 7PM-7AM, please contact night-coverage [email or web address]  03/11/2020, 3:07  PM   This record has been created using Systems analyst. Errors have been sought and corrected,but may not always be located. Such creation errors do not reflect on the standard of care.

## 2020-03-11 NOTE — Progress Notes (Signed)
SHIFT SUMMARY:  -Pt remained on Bipap throughout the day, unable to tolerate more than a few minutes off Bipap at a time -Pt denies any pain -Pt unable to tolerate Prone position -Pt repositioned from right to left side with pillow support as often as possible -Updated patients spouse via phone x2 this shift -Pts daughter updated via phone by Dr. Soyla Murphy

## 2020-03-11 NOTE — Progress Notes (Signed)
Name: Kathleen Espinoza MRN: [id number] DOB: Apr 03, 1971     CONSULTATION DATE: 03/01/2020 Subjective and objective: Afebrile, continue to require BiPAP and the security on high flow nasal cannula.  PAST MEDICAL HISTORY :   has a past medical history of Abnormal pap, Hypercholesterolemia, Hypertension, Microscopic hematuria, and Thin basement membrane disease.  has a past surgical history that includes Tonsillectomy and Tubal ligation. Prior to Admission medications   Medication Sig Start Date End Date Taking? Authorizing Provider  fluticasone (FLONASE) 50 MCG/ACT nasal spray Place 2 sprays into both nostrils daily as needed for allergies or rhinitis. After nasal saline 12/08/19  Yes McLean-Scocuzza, Nino Glow, MD  hydrochlorothiazide (MICROZIDE) 12.5 MG capsule TAKE 1 CAPSULE BY MOUTH  DAILY 07/22/19  Yes Einar Pheasant, MD  lisinopril (ZESTRIL) 40 MG tablet TAKE 1 TABLET BY MOUTH  DAILY 09/01/19  Yes Einar Pheasant, MD  loratadine (CLARITIN) 10 MG tablet Take 1 tablet (10 mg total) by mouth daily as needed for allergies. 12/08/19  Yes McLean-Scocuzza, Nino Glow, MD  omeprazole (PRILOSEC) 20 MG capsule Take 1 capsule (20 mg total) by mouth daily. 02/01/19  Yes Einar Pheasant, MD  rosuvastatin (CRESTOR) 10 MG tablet TAKE 1 TABLET(10 MG) BY MOUTH DAILY 02/22/20  Yes Einar Pheasant, MD  sodium chloride (OCEAN) 0.65 % SOLN nasal spray Place 2 sprays into both nostrils daily as needed for congestion. 12/08/19  Yes McLean-Scocuzza, Nino Glow, MD   Allergies  Allergen Reactions  . Norvasc [Amlodipine Besylate] Swelling  . Betadine [Povidone Iodine] Swelling  . Iodine Swelling    FAMILY HISTORY:  family history includes Breast cancer (age of onset: 66) in her maternal aunt; Breast cancer (age of onset: 14) in her paternal aunt; Colon cancer in her maternal grandfather and mother; Hypertension in her father. SOCIAL HISTORY:  reports that she has never smoked. She has never used smokeless tobacco. She  reports that she does not drink alcohol and does not use drugs.  REVIEW OF SYSTEMS:   Unable to obtain due to critical illness      Estimated body mass index is 46.29 kg/m as calculated from the following:   Height as of this encounter: 5\' 1"  (1.549 m).   Weight as of this encounter: 111.1 kg.    VITAL SIGNS: Temp:  [97.6 F (36.4 C)-98.5 F (36.9 C)] 98 F (36.7 C) (08/13 0800) Pulse Rate:  [41-73] 54 (08/13 1000) Resp:  [8-29] 17 (08/13 1000) BP: (107-154)/(47-130) 138/75 (08/13 1000) SpO2:  [82 %-96 %] 93 % (08/13 1000) FiO2 (%):  [86 %] 86 % (08/12 1600)   I/O last 3 completed shifts: In: 2308 [I.V.:1800.9; IV Piggyback:507.1] Out: 2250 [Urine:2250] Total I/O In: 250 [I.V.:250] Out: -    SpO2: 93 % O2 Flow Rate (L/min): 50 L/min FiO2 (%): 86 %   Physical Examination:   Awake and oriented with no focal motor deficits Tolerating BiPAP, no distress. Bilateral equal air entry and no rales S1 & S2 are audible with no murmur Benign abdomen with normal peristalsis No leg edema  CULTURE RESULTS   Recent Results (from the past 240 hour(s))  SARS Coronavirus 2 by RT PCR (hospital order, performed in [facility] hospital lab) Nasopharyngeal Nasopharyngeal Swab     Status: Abnormal   Collection Time: 03/11/2020  3:30 PM   Specimen: Nasopharyngeal Swab  Result Value Ref Range Status   SARS Coronavirus 2 POSITIVE (A) NEGATIVE Final    Comment: RESULT CALLED TO, READ BACK BY AND VERIFIED WITH: JENNIFER WHITLEY  RN AT 1610 ON 03/14/2020 SNG (NOTE) SARS-CoV-2 target nucleic acids are DETECTED  SARS-CoV-2 RNA is generally detectable in upper respiratory specimens  during the acute phase of infection.  Positive results are indicative  of the presence of the identified virus, but do not rule out bacterial infection or co-infection with other pathogens not detected by the test.  Clinical correlation with patient history and  other diagnostic information is necessary to  determine patient infection status.  The expected result is negative.  Fact Sheet for Patients:   StrictlyIdeas.no   Fact Sheet for Healthcare Providers:   BankingDealers.co.za    This test is not yet approved or cleared by the [place] FDA and  has been authorized for detection and/or diagnosis of SARS-CoV-2 by FDA under an Emergency Use Authorization (EUA).  This EUA will remain in effect (meaning  this test can be used) for the duration of  the COVID-19 declaration under Section 564(b)(1) of the Act, 21 U.S.C. section 360-bbb-3(b)(1), unless the authorization is terminated or revoked sooner.  Performed at [facility], [address] [place], [place] [address]   Blood culture (routine x 2)     Status: None (Preliminary result)   Collection Time: 03/06/2020  3:30 PM   Specimen: BLOOD  Result Value Ref Range Status   Specimen Description BLOOD BLOOD RIGHT ARM  Final   Special Requests   Final    BOTTLES DRAWN AEROBIC AND ANAEROBIC Blood Culture adequate volume   Culture   Final    NO GROWTH 4 DAYS Performed at [facility], [address] [place], [place] [address]    Report Status PENDING  Incomplete  Blood culture (routine x 2)     Status: None (Preliminary result)   Collection Time: 03/08/2020  3:30 PM   Specimen: BLOOD  Result Value Ref Range Status   Specimen Description BLOOD BLOOD LEFT ARM  Final   Special Requests   Final    BOTTLES DRAWN AEROBIC AND ANAEROBIC Blood Culture adequate volume   Culture   Final    NO GROWTH 4 DAYS Performed at [facility], [address], [place], [place] [address]    Report Status PENDING  Incomplete  MRSA PCR Screening     Status: None   Collection Time: 03/08/20  9:55 PM   Specimen: Nasal Mucosa; Nasopharyngeal  Result Value Ref Range Status   MRSA by PCR NEGATIVE NEGATIVE Final    Comment:        The GeneXpert MRSA Assay (FDA approved for NASAL  specimens only), is one component of a comprehensive MRSA colonization surveillance program. It is not intended to diagnose MRSA infection nor to guide or monitor treatment for MRSA infections. Performed at [facility], [address] [place],  [address]           IMAGING    DG Chest Port 1 View  Result Date: 03/11/2020 CLINICAL DATA:  Follow-up pneumonia EXAM: PORTABLE CHEST 1 VIEW COMPARISON:  03/10/2020 FINDINGS: Cardiac shadow is stable. Lungs are well aerated bilaterally. Diffuse multifocal airspace opacities are again identified stable from the prior exam. No bony abnormality is noted. IMPRESSION: No change from the previous day. Electronically Signed   By: Inez Catalina M.D.   On: 03/11/2020 08:06    Assessment and plan: Acute respiratory failure tolerating BiPAP ARDS with PF of 78 on 90% FiO2 COVID-19 pneumonia. Bilateral airspace disease (improved). MRSA PCR -ve D-dimer elevation with COVID-19 related hypercoagulable state.PE was ruled  out with CTA angio chest -Remdesivir, ivermectin and steroid -Therapeutic Lovenox because of progressively worsening respiratory status. -Monitor PF ratio -Empiric Rocephine  Prerenal azotemia with intravascular volume depletion. -Optimize volume status and monitor renal panel  Moderately controlled hypertension -Optimize antihypertensives and monitor hemodynamics.  Hyperglycemia -Glycemic control and taper of steroid as tolerated with improvement of respiratory status  Anemia -Keep hemoglobin more than 7 g/dL  Reactive thrombocytosis  DVT &GI prophylaxis. Continue with supportive care.  I spoke with the family (daughter) she was updated on all her questions were answered.  Critical care time 35 min

## 2020-03-11 NOTE — Progress Notes (Addendum)
Inpatient Diabetes Program Recommendations  AACE/ADA: New Consensus Statement on Inpatient Glycemic Control (2015)  Target Ranges:  Prepandial:   less than 140 mg/dL      Peak postprandial:   less than 180 mg/dL (1-2 hours)      Critically ill patients:  140 - 180 mg/dL   Results for Kathleen Espinoza, Kathleen Espinoza (MRN [id number]) as of 03/11/2020 14:39  Ref. Range 03/11/2020 07:45 03/11/2020 12:02  Glucose-Capillary Latest Ref Range: 70 - 99 mg/dL 214 (H) 224 (H)    New Diagnosis Diabetes this Admission Will reach out to pt to discuss new diagnosis once breathing has stabilized and pt able to hold conversation off Bipap   Current Meds: Levemir 6 units BID   Novolog Moderate Correction Scale/ SSI (0-15 units) TID AC + HS    Novolog 4 units TID with meals     MD- Note Solumedrol stopped--last dose given this AM and Decadron 6 mg daily to start.  CBGs remain >200 today.  Please consider:  1. Increase Levemir slightly to 8 units BID  2. Increase Novolog Meal Coverage to 6 units TID with meals    --Will follow patient during hospitalization--  Wyn Quaker RN, MSN, CDE Diabetes Coordinator Inpatient Glycemic Control Team Team Pager: [phone] (8a-5p)

## 2020-03-12 ENCOUNTER — Inpatient Hospital Stay: Payer: Managed Care, Other (non HMO)

## 2020-03-12 DIAGNOSIS — J1282 Pneumonia due to Coronavirus disease 2019: Secondary | ICD-10-CM | POA: Diagnosis not present

## 2020-03-12 DIAGNOSIS — J9601 Acute respiratory failure with hypoxia: Secondary | ICD-10-CM | POA: Diagnosis not present

## 2020-03-12 DIAGNOSIS — U071 COVID-19: Secondary | ICD-10-CM | POA: Diagnosis not present

## 2020-03-12 LAB — COMPREHENSIVE METABOLIC PANEL
ALT: 32 U/L (ref 0–44)
AST: 35 U/L (ref 15–41)
Albumin: 2.9 g/dL — ABNORMAL LOW (ref 3.5–5.0)
Alkaline Phosphatase: 190 U/L — ABNORMAL HIGH (ref 38–126)
Anion gap: 13 (ref 5–15)
BUN: 24 mg/dL — ABNORMAL HIGH (ref 6–20)
CO2: 29 mmol/L (ref 22–32)
Calcium: 7.7 mg/dL — ABNORMAL LOW (ref 8.9–10.3)
Chloride: 101 mmol/L (ref 98–111)
Creatinine, Ser: 0.67 mg/dL (ref 0.44–1.00)
GFR calc Af Amer: >60 mL/min (ref >60)
GFR calc non Af Amer: >60 mL/min (ref >60)
Glucose, Bld: 245 mg/dL — ABNORMAL HIGH (ref 70–99)
Potassium: 3.7 mmol/L (ref 3.5–5.1)
Sodium: 143 mmol/L (ref 135–145)
Total Bilirubin: 0.8 mg/dL (ref 0.3–1.2)
Total Protein: 6.3 g/dL — ABNORMAL LOW (ref 6.5–8.1)

## 2020-03-12 LAB — CBC WITH DIFFERENTIAL/PLATELET
Abs Immature Granulocytes: 0.26 10*3/uL — ABNORMAL HIGH (ref 0.00–0.07)
Basophils Absolute: 0.1 10*3/uL (ref 0.0–0.1)
Basophils Relative: 0 %
Eosinophils Absolute: 0 10*3/uL (ref 0.0–0.5)
Eosinophils Relative: 0 %
HCT: 35.6 % — ABNORMAL LOW (ref 36.0–46.0)
Hemoglobin: 11.5 g/dL — ABNORMAL LOW (ref 12.0–15.0)
Immature Granulocytes: 1 %
Lymphocytes Relative: 3 %
Lymphs Abs: 0.6 10*3/uL — ABNORMAL LOW (ref 0.7–4.0)
MCH: 25.2 pg — ABNORMAL LOW (ref 26.0–34.0)
MCHC: 32.3 g/dL (ref 30.0–36.0)
MCV: 78.1 fL — ABNORMAL LOW (ref 80.0–100.0)
Monocytes Absolute: 0.6 10*3/uL (ref 0.1–1.0)
Monocytes Relative: 3 %
Neutro Abs: 17.5 10*3/uL — ABNORMAL HIGH (ref 1.7–7.7)
Neutrophils Relative %: 93 %
Platelets: 421 10*3/uL — ABNORMAL HIGH (ref 150–400)
RBC: 4.56 MIL/uL (ref 3.87–5.11)
RDW: 13.7 % (ref 11.5–15.5)
WBC: 19.1 10*3/uL — ABNORMAL HIGH (ref 4.0–10.5)
nRBC: 0 % (ref 0.0–0.2)

## 2020-03-12 LAB — URINALYSIS, ROUTINE W REFLEX MICROSCOPIC
Bilirubin Urine: NEGATIVE
Glucose, UA: NEGATIVE mg/dL
Ketones, ur: NEGATIVE mg/dL
Leukocytes,Ua: NEGATIVE
Nitrite: NEGATIVE
Protein, ur: NEGATIVE mg/dL
Specific Gravity, Urine: 1.013 (ref 1.005–1.030)
pH: 7 (ref 5.0–8.0)

## 2020-03-12 LAB — MAGNESIUM: Magnesium: 2.3 mg/dL (ref 1.7–2.4)

## 2020-03-12 LAB — CULTURE, BLOOD (ROUTINE X 2)
Culture: NO GROWTH
Culture: NO GROWTH
Special Requests: ADEQUATE
Special Requests: ADEQUATE

## 2020-03-12 LAB — GLUCOSE, CAPILLARY
Glucose-Capillary: 226 mg/dL — ABNORMAL HIGH (ref 70–99)
Glucose-Capillary: 195 mg/dL — ABNORMAL HIGH (ref 70–99)
Glucose-Capillary: 291 mg/dL — ABNORMAL HIGH (ref 70–99)
Glucose-Capillary: 336 mg/dL — ABNORMAL HIGH (ref 70–99)
Glucose-Capillary: 176 mg/dL — ABNORMAL HIGH (ref 70–99)

## 2020-03-12 LAB — PHOSPHORUS: Phosphorus: 4.7 mg/dL — ABNORMAL HIGH (ref 2.5–4.6)

## 2020-03-12 LAB — C-REACTIVE PROTEIN: CRP: 1.3 mg/dL — ABNORMAL HIGH (ref <1.0)

## 2020-03-12 LAB — FIBRIN DERIVATIVES D-DIMER (ARMC ONLY): Fibrin derivatives D-dimer (ARMC): 3692.97 ng/mL (FEU) — ABNORMAL HIGH (ref 0.00–499.00)

## 2020-03-12 LAB — LACTIC ACID, PLASMA: Lactic Acid, Venous: 1.9 mmol/L (ref 0.5–1.9)

## 2020-03-12 LAB — FERRITIN: Ferritin: 169 ng/mL (ref 11–307)

## 2020-03-12 MED ORDER — INSULIN ASPART 100 UNIT/ML ~~LOC~~ SOLN: 0.0000 [IU] | SUBCUTANEOUS | Status: DC

## 2020-03-12 MED ORDER — METHYLPREDNISOLONE SODIUM SUCC 40 MG IJ SOLR
40.0000 mg | Freq: Every day | INTRAMUSCULAR | Status: DC
  Administered 2020-03-13 – 2020-03-14 (×2): 40 mg via INTRAVENOUS
  Filled 2020-03-12 (×2): qty 1

## 2020-03-12 MED ORDER — MELATONIN 5 MG PO TABS
2.5000 mg | ORAL_TABLET | Freq: Every day | ORAL | Status: DC
  Administered 2020-03-12 – 2020-03-15 (×3): 2.5 mg via ORAL
  Filled 2020-03-12 (×3): qty 1

## 2020-03-12 MED ORDER — MORPHINE SULFATE (PF) 2 MG/ML IV SOLN
2.0000 mg | INTRAVENOUS | Status: DC | PRN
  Administered 2020-03-12 – 2020-03-14 (×7): 2 mg via INTRAVENOUS
  Filled 2020-03-12 (×8): qty 1

## 2020-03-12 MED ORDER — INSULIN DETEMIR 100 UNIT/ML ~~LOC~~ SOLN
8.0000 [IU] | Freq: Two times a day (BID) | SUBCUTANEOUS | Status: DC
  Administered 2020-03-12 – 2020-03-16 (×9): 8 [IU] via SUBCUTANEOUS
  Filled 2020-03-12 (×10): qty 0.08

## 2020-03-12 MED ORDER — FUROSEMIDE 10 MG/ML IJ SOLN
INTRAMUSCULAR | Status: AC
  Administered 2020-03-12: 40 mg via INTRAVENOUS
  Filled 2020-03-12: qty 4

## 2020-03-12 MED ORDER — METHYLPREDNISOLONE SODIUM SUCC 125 MG IJ SOLR
60.0000 mg | Freq: Three times a day (TID) | INTRAMUSCULAR | Status: DC
  Administered 2020-03-12: 60 mg via INTRAVENOUS
  Filled 2020-03-12: qty 2

## 2020-03-12 MED ORDER — FUROSEMIDE 10 MG/ML IJ SOLN: 40.0000 mg | Freq: Once | INTRAMUSCULAR | Status: AC

## 2020-03-12 MED ORDER — INSULIN ASPART 100 UNIT/ML ~~LOC~~ SOLN
0.0000 [IU] | SUBCUTANEOUS | Status: DC
  Administered 2020-03-12 (×2): 4 [IU] via SUBCUTANEOUS
  Administered 2020-03-12: 11 [IU] via SUBCUTANEOUS
  Administered 2020-03-12: 15 [IU] via SUBCUTANEOUS
  Administered 2020-03-12: 7 [IU] via SUBCUTANEOUS
  Administered 2020-03-13: 3 [IU] via SUBCUTANEOUS
  Administered 2020-03-13 (×3): 4 [IU] via SUBCUTANEOUS
  Administered 2020-03-14: 3 [IU] via SUBCUTANEOUS
  Administered 2020-03-14: 4 [IU] via SUBCUTANEOUS
  Administered 2020-03-14: 3 [IU] via SUBCUTANEOUS
  Administered 2020-03-14 (×2): 4 [IU] via SUBCUTANEOUS
  Administered 2020-03-15: 3 [IU] via SUBCUTANEOUS
  Administered 2020-03-15: 7 [IU] via SUBCUTANEOUS
  Administered 2020-03-15: 11 [IU] via SUBCUTANEOUS
  Administered 2020-03-15: 7 [IU] via SUBCUTANEOUS
  Administered 2020-03-15: 11 [IU] via SUBCUTANEOUS
  Administered 2020-03-15: 4 [IU] via SUBCUTANEOUS
  Administered 2020-03-16 (×3): 7 [IU] via SUBCUTANEOUS
  Administered 2020-03-16: 4 [IU] via SUBCUTANEOUS
  Administered 2020-03-16: 3 [IU] via SUBCUTANEOUS
  Filled 2020-03-12 (×26): qty 1

## 2020-03-12 NOTE — Progress Notes (Signed)
Name: Kathleen Espinoza MRN: [id number] DOB: March 23, 1971     CONSULTATION DATE: 03/15/2020 Subjective and objective: Afebrile, continue to require BiPAP, saturation 1 takes of the mask off and responded quickly to changing position.  PAST MEDICAL HISTORY :   has a past medical history of Abnormal pap, Hypercholesterolemia, Hypertension, Microscopic hematuria, and Thin basement membrane disease.  has a past surgical history that includes Tonsillectomy and Tubal ligation. Prior to Admission medications   Medication Sig Start Date End Date Taking? Authorizing Provider  fluticasone (FLONASE) 50 MCG/ACT nasal spray Place 2 sprays into both nostrils daily as needed for allergies or rhinitis. After nasal saline 12/08/19  Yes McLean-Scocuzza, Nino Glow, MD  hydrochlorothiazide (MICROZIDE) 12.5 MG capsule TAKE 1 CAPSULE BY MOUTH  DAILY 07/22/19  Yes Einar Pheasant, MD  lisinopril (ZESTRIL) 40 MG tablet TAKE 1 TABLET BY MOUTH  DAILY 09/01/19  Yes Einar Pheasant, MD  loratadine (CLARITIN) 10 MG tablet Take 1 tablet (10 mg total) by mouth daily as needed for allergies. 12/08/19  Yes McLean-Scocuzza, Nino Glow, MD  omeprazole (PRILOSEC) 20 MG capsule Take 1 capsule (20 mg total) by mouth daily. 02/01/19  Yes Einar Pheasant, MD  rosuvastatin (CRESTOR) 10 MG tablet TAKE 1 TABLET(10 MG) BY MOUTH DAILY 02/22/20  Yes Einar Pheasant, MD  sodium chloride (OCEAN) 0.65 % SOLN nasal spray Place 2 sprays into both nostrils daily as needed for congestion. 12/08/19  Yes McLean-Scocuzza, Nino Glow, MD   Allergies  Allergen Reactions  . Norvasc [Amlodipine Besylate] Swelling  . Betadine [Povidone Iodine] Swelling  . Iodine Swelling    FAMILY HISTORY:  family history includes Breast cancer (age of onset: 34) in her maternal aunt; Breast cancer (age of onset: 11) in her paternal aunt; Colon cancer in her maternal grandfather and mother; Hypertension in her father. SOCIAL HISTORY:  reports that she has never smoked. She has never  used smokeless tobacco. She reports that she does not drink alcohol and does not use drugs.  REVIEW OF SYSTEMS:   Unable to obtain due to critical illness      Estimated body mass index is 42.78 kg/m as calculated from the following:   Height as of this encounter: 5\' 1"  (1.549 m).   Weight as of this encounter: 102.7 kg.    VITAL SIGNS: Temp:  [97.6 F (36.4 C)-98.8 F (37.1 C)] 98.8 F (37.1 C) (08/14 0300) Pulse Rate:  [45-67] 56 (08/14 0600) Resp:  [7-27] 17 (08/14 0600) BP: (100-156)/(51-95) 111/52 (08/14 0600) SpO2:  [85 %-96 %] 95 % (08/14 0600) FiO2 (%):  [100 %] 100 % (08/13 2100) Weight:  [102.7 kg] 102.7 kg (08/14 0600)   I/O last 3 completed shifts: In: 2033.5 [I.V.:1433.6; IV Piggyback:599.9] Out: 2775 [Urine:2775] No intake/output data recorded.   SpO2: 95 % O2 Flow Rate (L/min): 50 L/min FiO2 (%): 100 %   Physical Examination:  Awake and oriented with no focal motor deficits Tolerating BiPAP, no distress. Bilateral equal air entry andno rales S1 & S2 are audible with no murmur Benign abdomen with normal peristalsis No leg edema   CULTURE RESULTS   Recent Results (from the past 240 hour(s))  SARS Coronavirus 2 by RT PCR (hospital order, performed in [facility] hospital lab) Nasopharyngeal Nasopharyngeal Swab     Status: Abnormal   Collection Time: 03/26/2020  3:30 PM   Specimen: Nasopharyngeal Swab  Result Value Ref Range Status   SARS Coronavirus 2 POSITIVE (A) NEGATIVE Final    Comment: RESULT CALLED TO,  READ BACK BY AND VERIFIED WITH: JENNIFER WHITLEY RN AT 4128 ON 03/03/2020 SNG (NOTE) SARS-CoV-2 target nucleic acids are DETECTED  SARS-CoV-2 RNA is generally detectable in upper respiratory specimens  during the acute phase of infection.  Positive results are indicative  of the presence of the identified virus, but do not rule out bacterial infection or co-infection with other pathogens not detected by the test.  Clinical correlation with  patient history and  other diagnostic information is necessary to determine patient infection status.  The expected result is negative.  Fact Sheet for Patients:   StrictlyIdeas.no   Fact Sheet for Healthcare Providers:   BankingDealers.co.za    This test is not yet approved or cleared by the [place] FDA and  has been authorized for detection and/or diagnosis of SARS-CoV-2 by FDA under an Emergency Use Authorization (EUA).  This EUA will remain in effect (meaning  this test can be used) for the duration of  the COVID-19 declaration under Section 564(b)(1) of the Act, 21 U.S.C. section 360-bbb-3(b)(1), unless the authorization is terminated or revoked sooner.  Performed at [facility], [address] [place], [place] [address]   Blood culture (routine x 2)     Status: None   Collection Time: 03/16/2020  3:30 PM   Specimen: BLOOD  Result Value Ref Range Status   Specimen Description BLOOD BLOOD RIGHT ARM  Final   Special Requests   Final    BOTTLES DRAWN AEROBIC AND ANAEROBIC Blood Culture adequate volume   Culture   Final    NO GROWTH 5 DAYS Performed at [facility], [address] [place], [place] [address]    Report Status 03/12/2020 FINAL  Final  Blood culture (routine x 2)     Status: None   Collection Time: 03/27/2020  3:30 PM   Specimen: BLOOD  Result Value Ref Range Status   Specimen Description BLOOD BLOOD LEFT ARM  Final   Special Requests   Final    BOTTLES DRAWN AEROBIC AND ANAEROBIC Blood Culture adequate volume   Culture   Final    NO GROWTH 5 DAYS Performed at [facility], [address], [place], [place] [address]    Report Status 03/12/2020 FINAL  Final  MRSA PCR Screening     Status: None   Collection Time: 03/08/20  9:55 PM   Specimen: Nasal Mucosa; Nasopharyngeal  Result Value Ref Range Status   MRSA by PCR NEGATIVE NEGATIVE Final    Comment:        The GeneXpert  MRSA Assay (FDA approved for NASAL specimens only), is one component of a comprehensive MRSA colonization surveillance program. It is not intended to diagnose MRSA infection nor to guide or monitor treatment for MRSA infections. Performed at [facility], [address] [place], [place] [address]           IMAGING    DG Chest Port 1 View  Result Date: 03/12/2020 CLINICAL DATA:  Acute respiratory failure EXAM: PORTABLE CHEST 1 VIEW COMPARISON:  03/11/2020 FINDINGS: Cardiac shadow is stable. Diffuse airspace opacities are again noted and stable. No new focal infiltrate or effusion is seen. No pneumothorax is noted. IMPRESSION: Stable airspace opacities bilaterally similar to that seen on the prior exam. Electronically Signed   By: Inez Catalina M.D.   On: 03/12/2020 06:32     Nutrition Status: Nutrition Problem: Increased nutrient needs Etiology: catabolic illness (COVID 19) Signs/Symptoms: estimated needs      Assessment and  plan: Acute respiratory failure tolerating BiPAP ARDS with PF of 94 on 100% FiO2 BiPAP COVID-19 pneumonia. Bilateral airspace disease(improved). MRSA PCR -ve. Inflammatory markers are trending down. D-dimer elevation with COVID-19 related hypercoagulable state.PE was ruled out with CTA angio chest -Remdesivir, ivermectin and steroid -Therapeutic Lovenox because of progressively worsening respiratory status. -Monitor PF ratio -Empiric Rocephine  Prerenal azotemia with intravascular volume depletion and secondary to catabolic effect of the steroids.(improved) -Optimize volume status and monitor renal panel  Moderately controlled hypertension -Optimize antihypertensives and monitor hemodynamics.  Hyperglycemia -Glycemic control and taper of steroid as tolerated with improvement of respiratory status  Anemia -Keep hemoglobin more than 7 g/dL  Elevated alkaline phosphatase. -Monitor LFTs and consider liver ultrasound if no  improvement to rule out gallbladder disease.  Reactive thrombocytosis  DVT &GI prophylaxis. Continue with supportive care.     Critical care time 35 minutes

## 2020-03-12 NOTE — Progress Notes (Signed)
Patient unable to wean off 100%  bipap all shift- patient would tolerated off to drink some water but oxygen saturation would drop to the 70's/60's- patient never complaining of pain. Otherwise- vitals stable.

## 2020-03-12 NOTE — Progress Notes (Signed)
Inpatient Diabetes Program Recommendations  AACE/ADA: New Consensus Statement on Inpatient Glycemic Control (2015)  Target Ranges:  Prepandial:   less than 140 mg/dL      Peak postprandial:   less than 180 mg/dL (1-2 hours)      Critically ill patients:  140 - 180 mg/dL   Lab Results  Component Value Date   GLUCAP 226 (H) 03/12/2020   HGBA1C 7.3 (H) 03/09/2020    Review of Glycemic Control Results for TRYPHENA, PERKOVICH (MRN [id number]) as of 03/12/2020 08:21  Ref. Range 03/11/2020 07:45 03/11/2020 12:02 03/11/2020 16:15 03/11/2020 21:07 03/12/2020 03:43  Glucose-Capillary Latest Ref Range: 70 - 99 mg/dL 214 (H) 224 (H) 271 (H) 159 (H) 226 (H)    Inpatient Diabetes Program Recommendations:    Please consider:  1. Increase Levemir slightly to 8 units BID  2. Increase Novolog Meal Coverage to 6 units TID with meals  Will continue to follow while inpatient.  Thank you, Reche Dixon, RN, BSN Diabetes Coordinator Inpatient Diabetes Program [phone] (team pager from 8a-5p)

## 2020-03-12 NOTE — Progress Notes (Signed)
PROGRESS NOTE    Kathleen Espinoza  OZD:[id number] DOB: 1970-12-20 DOA: 03/02/2020 PCP: Einar Pheasant, MD   Brief Narrative:   Kathleen Espinoza is a 49 y.o. female with medical history significant of hypertension and dyslipidemia came to ED with complaint of worsening generalized weakness and shortness of breath. Patient started cough and mild exertional dyspnea approximately 11 to 12 days ago, due to persistent symptoms and some worsening she was tested for Covid at [place], no documentation 7-8 days ago and it was positive.  Husband with a similar symptoms and also admitted last night. She was hypoxic on arrival, chest x-ray with bilateral infiltrate consistent with COVID-19 pneumonia, elevated inflammatory markers.  Initially requiring 4 to 6 L.  Deteriorated later on, now on BiPAP in stepdown.  Subjective: Patient remained on BiPAP, desaturate easily with minor movements are removing the mask for very small period of time  Assessment & Plan:   Active Problems:   Pneumonia due to COVID-19 virus  Acute hypoxic respiratory failure secondary to COVID-19 pneumonia. Clinical symptoms, labs and imaging is consistent with COVID-19 pneumonia.  Elevated inflammatory markers, elevation in D-dimer with marked improvement in CRP. Borderline elevation of AST and ALT, stable at this time.  Worsening alkaline phosphatase.  Received 2 doses of Actemra, last dose on 03/10/2020.  Completed the course of remdesivir, received ivermectin.  Continue to require BiPAP to maintain saturation above 90% and desaturate very easily up to 60s with very minor movements or taking the mask off from mouth for food. -Continue with Solu-Medrol 40 mg twice daily. -Continue to monitor inflammatory markers. -Prone patient as tolerated. -Continue with supportive care. -She was started on ceftriaxone and azithromycin.  New diagnosis of diabetes.  A1c elevated at 7.3.  CBG elevated. -Consult to diabetes coordinator. -Continue with  SSI -Increase Levemir to 8 units twice daily. -Increase mealtime coverage to 6 units of NovoLog if she eats more than 50% of her meals  Transaminitis.  Mildly elevated AST and ALT.no prior history of hep B or C. Hep C and hep B screening was negative.  Currently stable. Worsening alkaline phosphatase. -Continue to monitor as patient is on remdesivir and also received two dose of Actemra. -Continue to monitor-we will obtained RUQ ultrasound if continue to get worsen.  Hypertension.  Blood pressure within goal. Patient was on lisinopril and HCTZ at home. -Continue lisinopril 20 mg daily, she was on 40 mg at home.  Hyperlipidemia. -Holding home dose of statin due to mildly elevated transaminitis.  Objective: Vitals:   03/12/20 1300 03/12/20 1355 03/12/20 1400 03/12/20 1500  BP: 136/72  (!) 101/52 (!) 108/53  Pulse: (!) 47 61 62 (!) 55  Resp: 20 (!) 23 (!) 23 17  Temp:  97.8 F (36.6 C)    TempSrc:  Axillary    SpO2: 95% 98% 96% (!) 87%  Weight:      Height:        Intake/Output Summary (Last 24 hours) at 03/12/2020 1514 Last data filed at 03/12/2020 1350 Gross per 24 hour  Intake 1323.64 ml  Output 3075 ml  Net -1751.36 ml   Filed Weights   03/16/2020 0812 03/10/2020 2343 03/12/20 0600  Weight: 108 kg 111.1 kg 102.7 kg    Examination:  General.  Well-developed lady, appears lethargic, in no acute distress. Pulmonary.  Lungs clear bilaterally, normal respiratory effort. CV.  Regular rate and rhythm, no JVD, rub or murmur. Abdomen.  Soft, nontender, nondistended, BS positive. CNS.  Alert and oriented x3.  No focal neurologic deficit. Extremities.  No edema, no cyanosis, pulses intact and symmetrical. Psychiatry.  Judgment and insight appears normal.  DVT prophylaxis: Lovenox Code Status: Full Family Communication: Updated the husband on phone. Disposition Plan:  Status is: Inpatient  Remains inpatient appropriate because:Inpatient level of care appropriate due to  severity of illness   Dispo: The patient is from: Home              Anticipated d/c is to: Home              Anticipated d/c date is: 3 days              Patient currently is not medically stable to d/c.  Patient is currently seriously ill and being managed in ICU.  Consultants:   Pulmonary  Procedures:  Antimicrobials:  Ceftriaxone Azithromycin  Data Reviewed: I have personally reviewed following labs and imaging studies  CBC: Recent Labs  Lab 03/08/20 0719 03/09/20 0439 03/10/20 0300 03/11/20 0136 03/12/20 0529  WBC 3.6* 9.5 14.2* 15.4* 19.1*  NEUTROABS 2.6 7.8* 12.4* 14.0* 17.5*  HGB 10.1* 10.9* 10.4* 11.4* 11.5*  HCT 31.9* 34.3* 33.2* 34.7* 35.6*  MCV 79.9* 80.5 80.8 78.0* 78.1*  PLT 211 309 350 410* 993*   Basic Metabolic Panel: Recent Labs  Lab 03/08/20 0719 03/09/20 0439 03/10/20 0300 03/11/20 0136 03/12/20 0529  NA 139 145 147* 146* 143  K 3.5 3.5 3.8 4.5 3.7  CL 104 106 106 105 101  CO2 23 26 29 30 29   GLUCOSE 218* 252* 247* 223* 245*  BUN 12 22* 26* 27* 24*  CREATININE 0.66 0.86 0.81 0.84 0.67  CALCIUM 7.8* 8.1* 8.2* 8.0* 7.7*  MG 2.0 2.0 2.3 2.4 2.3  PHOS 2.0* 2.9 3.6 4.4 4.7*   GFR: Estimated Creatinine Clearance: 94.8 mL/min (by C-G formula based on SCr of 0.67 mg/dL). Liver Function Tests: Recent Labs  Lab 03/08/20 0719 03/09/20 0439 03/10/20 0300 03/11/20 0136 03/12/20 0529  AST 57* 55* 38 40 35  ALT 46* 50* 45* 46* 32  ALKPHOS 63 73 80 120 190*  BILITOT 0.5 0.5 0.5 0.6 0.8  PROT 6.5 7.0 6.3* 6.7 6.3*  ALBUMIN 2.6* 2.9* 2.7* 2.9* 2.9*   No results for input(s): LIPASE, AMYLASE in the last 168 hours. No results for input(s): AMMONIA in the last 168 hours. Coagulation Profile: Recent Labs  Lab 03/11/20 0136  INR 1.1   Cardiac Enzymes: No results for input(s): CKTOTAL, CKMB, CKMBINDEX, TROPONINI in the last 168 hours. BNP (last 3 results) No results for input(s): PROBNP in the last 8760 hours. HbA1C: No results for  input(s): HGBA1C in the last 72 hours. CBG: Recent Labs  Lab 03/11/20 1615 03/11/20 2107 03/12/20 0343 03/12/20 0915 03/12/20 1338  GLUCAP 271* 159* 226* 195* 291*   Lipid Profile: No results for input(s): CHOL, HDL, LDLCALC, TRIG, CHOLHDL, LDLDIRECT in the last 72 hours. Thyroid Function Tests: No results for input(s): TSH, T4TOTAL, FREET4, T3FREE, THYROIDAB in the last 72 hours. Anemia Panel: Recent Labs    03/11/20 0136 03/12/20 0529  FERRITIN 187 169   Sepsis Labs: Recent Labs  Lab 03/29/2020 2014 03/08/20 0719 03/09/20 0439 03/10/20 0300 03/12/20 1024  PROCALCITON 0.13 <0.10 <0.10 <0.10  --   LATICACIDVEN  --   --   --   --  1.9    Recent Results (from the past 240 hour(s))  SARS Coronavirus 2 by RT PCR (hospital order, performed in [facility] hospital lab) Nasopharyngeal Nasopharyngeal Swab  Status: Abnormal   Collection Time: 03/15/2020  3:30 PM   Specimen: Nasopharyngeal Swab  Result Value Ref Range Status   SARS Coronavirus 2 POSITIVE (A) NEGATIVE Final    Comment: RESULT CALLED TO, READ BACK BY AND VERIFIED WITH: JENNIFER WHITLEY RN AT 3244 ON 03/06/2020 SNG (NOTE) SARS-CoV-2 target nucleic acids are DETECTED  SARS-CoV-2 RNA is generally detectable in upper respiratory specimens  during the acute phase of infection.  Positive results are indicative  of the presence of the identified virus, but do not rule out bacterial infection or co-infection with other pathogens not detected by the test.  Clinical correlation with patient history and  other diagnostic information is necessary to determine patient infection status.  The expected result is negative.  Fact Sheet for Patients:   StrictlyIdeas.no   Fact Sheet for Healthcare Providers:   BankingDealers.co.za    This test is not yet approved or cleared by the [place] FDA and  has been authorized for detection and/or diagnosis of SARS-CoV-2 by FDA under  an Emergency Use Authorization (EUA).  This EUA will remain in effect (meaning  this test can be used) for the duration of  the COVID-19 declaration under Section 564(b)(1) of the Act, 21 U.S.C. section 360-bbb-3(b)(1), unless the authorization is terminated or revoked sooner.  Performed at [facility], [address] [place], [place] [address]   Blood culture (routine x 2)     Status: None   Collection Time: 03/02/2020  3:30 PM   Specimen: BLOOD  Result Value Ref Range Status   Specimen Description BLOOD BLOOD RIGHT ARM  Final   Special Requests   Final    BOTTLES DRAWN AEROBIC AND ANAEROBIC Blood Culture adequate volume   Culture   Final    NO GROWTH 5 DAYS Performed at [facility], [address] [place], [place] [address]    Report Status 03/12/2020 FINAL  Final  Blood culture (routine x 2)     Status: None   Collection Time: 03/19/2020  3:30 PM   Specimen: BLOOD  Result Value Ref Range Status   Specimen Description BLOOD BLOOD LEFT ARM  Final   Special Requests   Final    BOTTLES DRAWN AEROBIC AND ANAEROBIC Blood Culture adequate volume   Culture   Final    NO GROWTH 5 DAYS Performed at [facility], [address], [place], [place] [address]    Report Status 03/12/2020 FINAL  Final  MRSA PCR Screening     Status: None   Collection Time: 03/08/20  9:55 PM   Specimen: Nasal Mucosa; Nasopharyngeal  Result Value Ref Range Status   MRSA by PCR NEGATIVE NEGATIVE Final    Comment:        The GeneXpert MRSA Assay (FDA approved for NASAL specimens only), is one component of a comprehensive MRSA colonization surveillance program. It is not intended to diagnose MRSA infection nor to guide or monitor treatment for MRSA infections. Performed at [facility], [address], [place], [place] [address]      Radiology Studies: [facility] Chest [place] 1 View  Result Date: 03/12/2020 CLINICAL DATA:  Acute respiratory failure EXAM: PORTABLE  CHEST 1 VIEW COMPARISON:  03/11/2020 FINDINGS: Cardiac shadow is stable. Diffuse airspace opacities are again noted and stable. No new focal infiltrate or effusion is seen. No pneumothorax is noted. IMPRESSION: Stable airspace opacities bilaterally similar to that seen on the prior exam. Electronically Signed   By: Linus Mako.D.  On: 03/12/2020 06:32   DG Chest Port 1 View  Result Date: 03/11/2020 CLINICAL DATA:  Follow-up pneumonia EXAM: PORTABLE CHEST 1 VIEW COMPARISON:  03/10/2020 FINDINGS: Cardiac shadow is stable. Lungs are well aerated bilaterally. Diffuse multifocal airspace opacities are again identified stable from the prior exam. No bony abnormality is noted. IMPRESSION: No change from the previous day. Electronically Signed   By: Inez Catalina M.D.   On: 03/11/2020 08:06    Scheduled Meds: . vitamin C  500 mg Oral Daily  . Chlorhexidine Gluconate Cloth  6 each Topical Daily  . enoxaparin (LOVENOX) injection  110 mg Subcutaneous Q12H  . feeding supplement (NEPRO CARB STEADY)  237 mL Oral TID BM  . insulin aspart  0-20 Units Subcutaneous Q4H  . insulin detemir  8 Units Subcutaneous BID  . Ipratropium-Albuterol  1 puff Inhalation Q6H  . lisinopril  20 mg Oral Daily  . mouth rinse  15 mL Mouth Rinse BID  . [START ON 03/13/2020] methylPREDNISolone (SOLU-MEDROL) injection  40 mg Intravenous Daily  . multivitamin with minerals  1 tablet Oral Daily  . pantoprazole (PROTONIX) IV  40 mg Intravenous Q24H  . sodium chloride flush  10-40 mL Intracatheter Q12H  . sodium chloride flush  3 mL Intravenous Q12H  . zinc sulfate  220 mg Oral Daily   Continuous Infusions: . azithromycin Stopped (03/11/20 1826)  . cefTRIAXone (ROCEPHIN)  IV 2 g (03/12/20 1350)     LOS: 5 days   Time spent: 40 minutes.  Lorella Nimrod, MD [facility]  If 7PM-7AM, please contact night-coverage [email or web address]  03/12/2020, 3:14 PM   This record has been created using Systems analyst.  Errors have been sought and corrected,but may not always be located. Such creation errors do not reflect on the standard of care.

## 2020-03-13 ENCOUNTER — Inpatient Hospital Stay: Payer: Managed Care, Other (non HMO)

## 2020-03-13 DIAGNOSIS — J1282 Pneumonia due to Coronavirus disease 2019: Secondary | ICD-10-CM | POA: Diagnosis not present

## 2020-03-13 DIAGNOSIS — J9601 Acute respiratory failure with hypoxia: Secondary | ICD-10-CM | POA: Diagnosis not present

## 2020-03-13 DIAGNOSIS — U071 COVID-19: Secondary | ICD-10-CM | POA: Diagnosis not present

## 2020-03-13 LAB — GLUCOSE, CAPILLARY
Glucose-Capillary: 126 mg/dL — ABNORMAL HIGH (ref 70–99)
Glucose-Capillary: 113 mg/dL — ABNORMAL HIGH (ref 70–99)
Glucose-Capillary: 86 mg/dL (ref 70–99)
Glucose-Capillary: 159 mg/dL — ABNORMAL HIGH (ref 70–99)
Glucose-Capillary: 200 mg/dL — ABNORMAL HIGH (ref 70–99)
Glucose-Capillary: 176 mg/dL — ABNORMAL HIGH (ref 70–99)

## 2020-03-13 LAB — BLOOD GAS, ARTERIAL
Acid-Base Excess: 7.2 mmol/L — ABNORMAL HIGH (ref 0.0–2.0)
Bicarbonate: 31.2 mmol/L — ABNORMAL HIGH (ref 20.0–28.0)
Delivery systems: POSITIVE
Expiratory PAP: 10
FIO2: 1
Inspiratory PAP: 15
Mechanical Rate: 12
O2 Saturation: 93.3 %
Patient temperature: 37
RATE: 12 resp/min
pCO2 arterial: 41 mmHg (ref 32.0–48.0)
pH, Arterial: 7.49 — ABNORMAL HIGH (ref 7.350–7.450)
pO2, Arterial: 62 mmHg — ABNORMAL LOW (ref 83.0–108.0)

## 2020-03-13 LAB — COMPREHENSIVE METABOLIC PANEL
ALT: 22 U/L (ref 0–44)
AST: 39 U/L (ref 15–41)
Albumin: 3 g/dL — ABNORMAL LOW (ref 3.5–5.0)
Alkaline Phosphatase: 219 U/L — ABNORMAL HIGH (ref 38–126)
Anion gap: 14 (ref 5–15)
BUN: 24 mg/dL — ABNORMAL HIGH (ref 6–20)
CO2: 29 mmol/L (ref 22–32)
Calcium: 7.8 mg/dL — ABNORMAL LOW (ref 8.9–10.3)
Chloride: 102 mmol/L (ref 98–111)
Creatinine, Ser: 0.72 mg/dL (ref 0.44–1.00)
GFR calc Af Amer: >60 mL/min (ref >60)
GFR calc non Af Amer: >60 mL/min (ref >60)
Glucose, Bld: 151 mg/dL — ABNORMAL HIGH (ref 70–99)
Potassium: 3.3 mmol/L — ABNORMAL LOW (ref 3.5–5.1)
Sodium: 145 mmol/L (ref 135–145)
Total Bilirubin: 1 mg/dL (ref 0.3–1.2)
Total Protein: 6.2 g/dL — ABNORMAL LOW (ref 6.5–8.1)

## 2020-03-13 LAB — CBC WITH DIFFERENTIAL/PLATELET
Abs Immature Granulocytes: 0.18 10*3/uL — ABNORMAL HIGH (ref 0.00–0.07)
Basophils Absolute: 0 10*3/uL (ref 0.0–0.1)
Basophils Relative: 0 %
Eosinophils Absolute: 0.3 10*3/uL (ref 0.0–0.5)
Eosinophils Relative: 2 %
HCT: 38.4 % (ref 36.0–46.0)
Hemoglobin: 12.6 g/dL (ref 12.0–15.0)
Immature Granulocytes: 1 %
Lymphocytes Relative: 3 %
Lymphs Abs: 0.5 10*3/uL — ABNORMAL LOW (ref 0.7–4.0)
MCH: 25.5 pg — ABNORMAL LOW (ref 26.0–34.0)
MCHC: 32.8 g/dL (ref 30.0–36.0)
MCV: 77.7 fL — ABNORMAL LOW (ref 80.0–100.0)
Monocytes Absolute: 0.4 10*3/uL (ref 0.1–1.0)
Monocytes Relative: 3 %
Neutro Abs: 14 10*3/uL — ABNORMAL HIGH (ref 1.7–7.7)
Neutrophils Relative %: 91 %
Platelets: 437 10*3/uL — ABNORMAL HIGH (ref 150–400)
RBC: 4.94 MIL/uL (ref 3.87–5.11)
RDW: 14.2 % (ref 11.5–15.5)
WBC: 15.4 10*3/uL — ABNORMAL HIGH (ref 4.0–10.5)
nRBC: 0 % (ref 0.0–0.2)

## 2020-03-13 LAB — PHOSPHORUS: Phosphorus: 3.2 mg/dL (ref 2.5–4.6)

## 2020-03-13 LAB — MAGNESIUM: Magnesium: 2.3 mg/dL (ref 1.7–2.4)

## 2020-03-13 MED ORDER — FUROSEMIDE 10 MG/ML IJ SOLN
20.0000 mg | Freq: Once | INTRAMUSCULAR | Status: AC
  Administered 2020-03-13: 20 mg via INTRAVENOUS
  Filled 2020-03-13: qty 2

## 2020-03-13 NOTE — Progress Notes (Addendum)
PROGRESS NOTE    Kathleen Espinoza  MLY:[id number] DOB: [birth year] DOA: 03/05/2020 PCP: Einar Pheasant, MD   Brief Narrative:   Kathleen Espinoza is a 49 y.o. female with medical history significant of hypertension and dyslipidemia came to ED with complaint of worsening generalized weakness and shortness of breath. Patient started cough and mild exertional dyspnea approximately 11 to 12 days ago, due to persistent symptoms and some worsening she was tested for Covid at [facility], no documentation 7-8 days ago and it was positive.  Husband with a similar symptoms and also admitted last night. She was hypoxic on arrival, chest x-ray with bilateral infiltrate consistent with COVID-19 pneumonia, elevated inflammatory markers.  Initially requiring 4 to 6 L.  Deteriorated later on, now on BiPAP in stepdown.  Subjective: Patient continued to desaturate easily whenever takes BiPAP off, even for few seconds.  Hardly making 90% on BiPAP.  Denies any symptoms.  Saturation show some improvement with proning.  Assessment & Plan:   Active Problems:   Pneumonia due to COVID-19 virus  Acute hypoxic respiratory failure secondary to COVID-19 pneumonia. Clinical symptoms, labs and imaging is consistent with COVID-19 pneumonia.  Elevated inflammatory markers, elevation in D-dimer with marked improvement in CRP. Borderline elevation of AST and ALT, stable at this time.  Worsening alkaline phosphatase.  Received 2 doses of Actemra, last dose on 03/10/2020.  Completed the course of remdesivir, received ivermectin.  Continue to require BiPAP to maintain saturation above 90% and desaturate very easily up to 60s with very minor movements or taking the mask off from mouth for food. -Continue with Solu-Medrol 40 mg daily, decreased today from twice daily dose. -Continue to monitor inflammatory markers. -Prone patient as tolerated. -Continue with supportive care. -Continue ceftriaxone and azithromycin. -Encourage proning as  much as she can tolerate as it shows some improvement in hypoxia.  New diagnosis of diabetes.  A1c elevated at 7.3.  CBG elevated. -Consult to diabetes coordinator. -Continue with SSI -Increase Levemir to 8 units twice daily. -Increase mealtime coverage to 6 units of NovoLog if she eats more than 50% of her meals  Transaminitis.  Mildly elevated AST and ALT.no prior history of hep B or C. Hep C and hep B screening was negative.  Currently stable. Worsening alkaline phosphatase. -Continue to monitor as patient is on remdesivir and also received two dose of Actemra. -Continue to monitor-we will obtained RUQ ultrasound if continue to get worsen.  Hypertension.  Blood pressure little soft today. Patient was on lisinopril and HCTZ at home. -Hold lisinopril and HCTZ both at this time, she was only started on low-dose lisinopril due to elevated blood pressures before.  Hyperlipidemia. -Holding home dose of statin due to mildly elevated transaminitis.  Objective: Vitals:   03/13/20 0900 03/13/20 1000 03/13/20 1100 03/13/20 1200  BP: (!) 99/52 (!) 94/54 (!) 77/62 (!) 103/57  Pulse: 64 69 76 71  Resp: (!) 24 (!) 24 (!) 29 (!) 23  Temp:    97.8 F (36.6 C)  TempSrc:    Axillary  SpO2: (!) 88% 93% (!) 88% (!) 87%  Weight:      Height:        Intake/Output Summary (Last 24 hours) at 03/13/2020 1258 Last data filed at 03/13/2020 0800 Gross per 24 hour  Intake 937 ml  Output 1520 ml  Net -583 ml   Filed Weights   03/04/2020 2343 03/12/20 0600 03/12/20 1900  Weight: 111.1 kg 102.7 kg 102.8 kg    Examination:  General.  Well-developed lady who was proning,in no acute distress. Pulmonary.  Lungs clear bilaterally, normal respiratory effort. CV.  Regular rate and rhythm, no JVD, rub or murmur. Abdomen.  Soft, nontender, nondistended, BS positive. CNS.  Alert and oriented x3.  No focal neurologic deficit. Extremities.  No edema, no cyanosis, pulses intact and symmetrical. Psychiatry.   Judgment and insight appears normal.  DVT prophylaxis: Lovenox Code Status: Full Family Communication: Updated the daughter on phone. Disposition Plan:  Status is: Inpatient  Remains inpatient appropriate because:Inpatient level of care appropriate due to severity of illness   Dispo: The patient is from: Home              Anticipated d/c is to: Home              Anticipated d/c date is: 3 days              Patient currently is not medically stable to d/c.  Patient is currently seriously ill and being managed in ICU.  Consultants:   Pulmonary  Procedures:  Antimicrobials:  Ceftriaxone Azithromycin  Data Reviewed: I have personally reviewed following labs and imaging studies  CBC: Recent Labs  Lab 03/09/20 0439 03/10/20 0300 03/11/20 0136 03/12/20 0529 03/13/20 1056  WBC 9.5 14.2* 15.4* 19.1* 15.4*  NEUTROABS 7.8* 12.4* 14.0* 17.5* 14.0*  HGB 10.9* 10.4* 11.4* 11.5* 12.6  HCT 34.3* 33.2* 34.7* 35.6* 38.4  MCV 80.5 80.8 78.0* 78.1* 77.7*  PLT 309 350 410* 421* 737*   Basic Metabolic Panel: Recent Labs  Lab 03/09/20 0439 03/10/20 0300 03/11/20 0136 03/12/20 0529 03/13/20 1056  NA 145 147* 146* 143 145  K 3.5 3.8 4.5 3.7 3.3*  CL 106 106 105 101 102  CO2 26 29 30 29 29   GLUCOSE 252* 247* 223* 245* 151*  BUN 22* 26* 27* 24* 24*  CREATININE 0.86 0.81 0.84 0.67 0.72  CALCIUM 8.1* 8.2* 8.0* 7.7* 7.8*  MG 2.0 2.3 2.4 2.3 2.3  PHOS 2.9 3.6 4.4 4.7* 3.2   GFR: Estimated Creatinine Clearance: 94.8 mL/min (by C-G formula based on SCr of 0.72 mg/dL). Liver Function Tests: Recent Labs  Lab 03/09/20 0439 03/10/20 0300 03/11/20 0136 03/12/20 0529 03/13/20 1056  AST 55* 38 40 35 39  ALT 50* 45* 46* 32 22  ALKPHOS 73 80 120 190* 219*  BILITOT 0.5 0.5 0.6 0.8 1.0  PROT 7.0 6.3* 6.7 6.3* 6.2*  ALBUMIN 2.9* 2.7* 2.9* 2.9* 3.0*   No results for input(s): LIPASE, AMYLASE in the last 168 hours. No results for input(s): AMMONIA in the last 168 hours. Coagulation  Profile: Recent Labs  Lab 03/11/20 0136  INR 1.1   Cardiac Enzymes: No results for input(s): CKTOTAL, CKMB, CKMBINDEX, TROPONINI in the last 168 hours. BNP (last 3 results) No results for input(s): PROBNP in the last 8760 hours. HbA1C: No results for input(s): HGBA1C in the last 72 hours. CBG: Recent Labs  Lab 03/12/20 2048 03/13/20 0157 03/13/20 0334 03/13/20 0745 03/13/20 1133  GLUCAP 176* 126* 113* 86 159*   Lipid Profile: No results for input(s): CHOL, HDL, LDLCALC, TRIG, CHOLHDL, LDLDIRECT in the last 72 hours. Thyroid Function Tests: No results for input(s): TSH, T4TOTAL, FREET4, T3FREE, THYROIDAB in the last 72 hours. Anemia Panel: Recent Labs    03/11/20 0136 03/12/20 0529  FERRITIN 187 169   Sepsis Labs: Recent Labs  Lab 03/28/2020 2014 03/08/20 0719 03/09/20 0439 03/10/20 0300 03/12/20 1024  PROCALCITON 0.13 <0.10 <0.10 <0.10  --  LATICACIDVEN  --   --   --   --  1.9    Recent Results (from the past 240 hour(s))  SARS Coronavirus 2 by RT PCR (hospital order, performed in [facility] hospital lab) Nasopharyngeal Nasopharyngeal Swab     Status: Abnormal   Collection Time: 03/24/2020  3:30 PM   Specimen: Nasopharyngeal Swab  Result Value Ref Range Status   SARS Coronavirus 2 POSITIVE (A) NEGATIVE Final    Comment: RESULT CALLED TO, READ BACK BY AND VERIFIED WITH: JENNIFER WHITLEY RN AT 4268 ON 03/06/2020 SNG (NOTE) SARS-CoV-2 target nucleic acids are DETECTED  SARS-CoV-2 RNA is generally detectable in upper respiratory specimens  during the acute phase of infection.  Positive results are indicative  of the presence of the identified virus, but do not rule out bacterial infection or co-infection with other pathogens not detected by the test.  Clinical correlation with patient history and  other diagnostic information is necessary to determine patient infection status.  The expected result is negative.  Fact Sheet for Patients:     StrictlyIdeas.no   Fact Sheet for Healthcare Providers:   BankingDealers.co.za    This test is not yet approved or cleared by the [place] FDA and  has been authorized for detection and/or diagnosis of SARS-CoV-2 by FDA under an Emergency Use Authorization (EUA).  This EUA will remain in effect (meaning  this test can be used) for the duration of  the COVID-19 declaration under Section 564(b)(1) of the Act, 21 U.S.C. section 360-bbb-3(b)(1), unless the authorization is terminated or revoked sooner.  Performed at [facility], [address] [place], [place] [address]   Blood culture (routine x 2)     Status: None   Collection Time: 03/28/2020  3:30 PM   Specimen: BLOOD  Result Value Ref Range Status   Specimen Description BLOOD BLOOD RIGHT ARM  Final   Special Requests   Final    BOTTLES DRAWN AEROBIC AND ANAEROBIC Blood Culture adequate volume   Culture   Final    NO GROWTH 5 DAYS Performed at [facility], [address] [place], [place] [address]    Report Status 03/12/2020 FINAL  Final  Blood culture (routine x 2)     Status: None   Collection Time: 03/24/2020  3:30 PM   Specimen: BLOOD  Result Value Ref Range Status   Specimen Description BLOOD BLOOD LEFT ARM  Final   Special Requests   Final    BOTTLES DRAWN AEROBIC AND ANAEROBIC Blood Culture adequate volume   Culture   Final    NO GROWTH 5 DAYS Performed at [facility], [address], [place], [place] [address]    Report Status 03/12/2020 FINAL  Final  MRSA PCR Screening     Status: None   Collection Time: 03/08/20  9:55 PM   Specimen: Nasal Mucosa; Nasopharyngeal  Result Value Ref Range Status   MRSA by PCR NEGATIVE NEGATIVE Final    Comment:        The GeneXpert MRSA Assay (FDA approved for NASAL specimens only), is one component of a comprehensive MRSA colonization surveillance program. It is not intended to diagnose  MRSA infection nor to guide or monitor treatment for MRSA infections. Performed at [facility], [address], [place], [place] [address]      Radiology Studies: [facility] Chest [place] 1 View  Result Date: 03/13/2020 CLINICAL DATA:  Follow-up bilateral airspace opacities EXAM: PORTABLE CHEST 1 VIEW COMPARISON:  03/12/2020  FINDINGS: Cardiac shadow is stable. Diffuse airspace opacities are again identified bilaterally consistent with the given clinical history of COVID-19 positivity. No bony abnormality is noted. IMPRESSION: No change when compare with the prior exam. Electronically Signed   By: Inez Catalina M.D.   On: 03/13/2020 05:47   DG Chest Port 1 View  Result Date: 03/12/2020 CLINICAL DATA:  Acute respiratory failure EXAM: PORTABLE CHEST 1 VIEW COMPARISON:  03/11/2020 FINDINGS: Cardiac shadow is stable. Diffuse airspace opacities are again noted and stable. No new focal infiltrate or effusion is seen. No pneumothorax is noted. IMPRESSION: Stable airspace opacities bilaterally similar to that seen on the prior exam. Electronically Signed   By: Inez Catalina M.D.   On: 03/12/2020 06:32   US Abdomen Limited RUQ  Result Date: 03/13/2020 CLINICAL DATA:  Concern for cholecystitis. EXAM: ULTRASOUND ABDOMEN LIMITED RIGHT UPPER QUADRANT COMPARISON:  CT abdomen pelvis dated 09/14/2017. FINDINGS: Gallbladder: No gallstones or wall thickening visualized. No sonographic Murphy sign noted by sonographer. Common bile duct: Diameter: 4 mm Liver: No focal lesion identified. Increased parenchymal echogenicity. Portal vein is patent on color Doppler imaging with normal direction of blood flow towards the liver. Other: None. IMPRESSION: 1.  No sonographic evidence of acute cholecystitis. 2.  Increased liver echogenicity likely reflects hepatic steatosis. Electronically Signed   By: Zerita Boers M.D.   On: 03/13/2020 12:41    Scheduled Meds: . vitamin C  500 mg Oral Daily  . Chlorhexidine Gluconate Cloth   6 each Topical Daily  . enoxaparin (LOVENOX) injection  110 mg Subcutaneous Q12H  . feeding supplement (NEPRO CARB STEADY)  237 mL Oral TID BM  . insulin aspart  0-20 Units Subcutaneous Q4H  . insulin detemir  8 Units Subcutaneous BID  . Ipratropium-Albuterol  1 puff Inhalation Q6H  . lisinopril  20 mg Oral Daily  . mouth rinse  15 mL Mouth Rinse BID  . melatonin  2.5 mg Oral QHS  . methylPREDNISolone (SOLU-MEDROL) injection  40 mg Intravenous Daily  . multivitamin with minerals  1 tablet Oral Daily  . pantoprazole (PROTONIX) IV  40 mg Intravenous Q24H  . sodium chloride flush  10-40 mL Intracatheter Q12H  . sodium chloride flush  3 mL Intravenous Q12H  . zinc sulfate  220 mg Oral Daily   Continuous Infusions: . azithromycin Stopped (03/12/20 1751)  . cefTRIAXone (ROCEPHIN)  IV Stopped (03/12/20 1420)     LOS: 6 days   Time spent: 40 minutes.  Lorella Nimrod, MD [facility]  If 7PM-7AM, please contact night-coverage [email or web address]  03/13/2020, 12:58 PM   This record has been created using Systems analyst. Errors have been sought and corrected,but may not always be located. Such creation errors do not reflect on the standard of care.

## 2020-03-13 NOTE — Progress Notes (Signed)
Name: Kathleen Espinoza MRN: [id number] DOB: [birth year]     CONSULTATION DATE: 02/29/2020 Subjective and objective: Afebrile, continues to desaturate when off BiPAP and hemodynamically stable. PAST MEDICAL HISTORY :   has a past medical history of Abnormal pap, Hypercholesterolemia, Hypertension, Microscopic hematuria, and Thin basement membrane disease.  has a past surgical history that includes Tonsillectomy and Tubal ligation. Prior to Admission medications   Medication Sig Start Date End Date Taking? Authorizing Provider  fluticasone (FLONASE) 50 MCG/ACT nasal spray Place 2 sprays into both nostrils daily as needed for allergies or rhinitis. After nasal saline 12/08/19  Yes McLean-Scocuzza, Nino Glow, MD  hydrochlorothiazide (MICROZIDE) 12.5 MG capsule TAKE 1 CAPSULE BY MOUTH  DAILY 07/22/19  Yes Einar Pheasant, MD  lisinopril (ZESTRIL) 40 MG tablet TAKE 1 TABLET BY MOUTH  DAILY 09/01/19  Yes Einar Pheasant, MD  loratadine (CLARITIN) 10 MG tablet Take 1 tablet (10 mg total) by mouth daily as needed for allergies. 12/08/19  Yes McLean-Scocuzza, Nino Glow, MD  omeprazole (PRILOSEC) 20 MG capsule Take 1 capsule (20 mg total) by mouth daily. 02/01/19  Yes Einar Pheasant, MD  rosuvastatin (CRESTOR) 10 MG tablet TAKE 1 TABLET(10 MG) BY MOUTH DAILY 02/22/20  Yes Einar Pheasant, MD  sodium chloride (OCEAN) 0.65 % SOLN nasal spray Place 2 sprays into both nostrils daily as needed for congestion. 12/08/19  Yes McLean-Scocuzza, Nino Glow, MD   Allergies  Allergen Reactions  . Norvasc [Amlodipine Besylate] Swelling  . Betadine [Povidone Iodine] Swelling  . Iodine Swelling    FAMILY HISTORY:  family history includes Breast cancer (age of onset: 87) in her maternal aunt; Breast cancer (age of onset: 41) in her paternal aunt; Colon cancer in her maternal grandfather and mother; Hypertension in her father. SOCIAL HISTORY:  reports that she has never smoked. She has never used smokeless tobacco. She reports that  she does not drink alcohol and does not use drugs.  REVIEW OF SYSTEMS:   Unable to obtain due to critical illness      Estimated body mass index is 42.82 kg/m as calculated from the following:   Height as of this encounter: 5\' 1"  (1.549 m).   Weight as of this encounter: 102.8 kg.    VITAL SIGNS: Temp:  [97.8 F (36.6 C)-98.7 F (37.1 C)] 98 F (36.7 C) (08/15 0800) Pulse Rate:  [47-76] 76 (08/15 0800) Resp:  [17-33] 33 (08/15 0800) BP: (101-148)/(52-86) 101/59 (08/15 0800) SpO2:  [86 %-98 %] 90 % (08/15 0800) Weight:  [102.8 kg] 102.8 kg (08/14 1900)   I/O last 3 completed shifts: In: 1923.6 [P.O.:474; I.V.:498; IV Piggyback:951.6] Out: 3700 [Urine:3700] Total I/O In: -  Out: 70 [Urine:70]   SpO2: 90 % O2 Flow Rate (L/min): 50 L/min FiO2 (%): 100 %   Physical Examination:  Awake and oriented with no focal motor deficits Tolerating BiPAP, no distress. Bilateral equal air entry andno rales S1 & S2 are audible with no murmur Benign abdomen with normal peristalsis No leg edema   CULTURE RESULTS   Recent Results (from the past 240 hour(s))  SARS Coronavirus 2 by RT PCR (hospital order, performed in [facility] hospital lab) Nasopharyngeal Nasopharyngeal Swab     Status: Abnormal   Collection Time: 03/23/2020  3:30 PM   Specimen: Nasopharyngeal Swab  Result Value Ref Range Status   SARS Coronavirus 2 POSITIVE (A) NEGATIVE Final    Comment: RESULT CALLED TO, READ BACK BY AND VERIFIED WITHAnderson Malta [facility] RN AT 3419 ON 03/10/2020  SNG (NOTE) SARS-CoV-2 target nucleic acids are DETECTED  SARS-CoV-2 RNA is generally detectable in upper respiratory specimens  during the acute phase of infection.  Positive results are indicative  of the presence of the identified virus, but do not rule out bacterial infection or co-infection with other pathogens not detected by the test.  Clinical correlation with patient history and  other diagnostic information is necessary to  determine patient infection status.  The expected result is negative.  Fact Sheet for Patients:   StrictlyIdeas.no   Fact Sheet for Healthcare Providers:   BankingDealers.co.za    This test is not yet approved or cleared by the [place] FDA and  has been authorized for detection and/or diagnosis of SARS-CoV-2 by FDA under an Emergency Use Authorization (EUA).  This EUA will remain in effect (meaning  this test can be used) for the duration of  the COVID-19 declaration under Section 564(b)(1) of the Act, 21 U.S.C. section 360-bbb-3(b)(1), unless the authorization is terminated or revoked sooner.  Performed at [facility], [address] [place], [place] [address]   Blood culture (routine x 2)     Status: None   Collection Time: 03/13/2020  3:30 PM   Specimen: BLOOD  Result Value Ref Range Status   Specimen Description BLOOD BLOOD RIGHT ARM  Final   Special Requests   Final    BOTTLES DRAWN AEROBIC AND ANAEROBIC Blood Culture adequate volume   Culture   Final    NO GROWTH 5 DAYS Performed at [facility], [address] [place], [place] [address]    Report Status 03/12/2020 FINAL  Final  Blood culture (routine x 2)     Status: None   Collection Time: 03/26/2020  3:30 PM   Specimen: BLOOD  Result Value Ref Range Status   Specimen Description BLOOD BLOOD LEFT ARM  Final   Special Requests   Final    BOTTLES DRAWN AEROBIC AND ANAEROBIC Blood Culture adequate volume   Culture   Final    NO GROWTH 5 DAYS Performed at [facility], [address], [place], [place] [address]    Report Status 03/12/2020 FINAL  Final  MRSA PCR Screening     Status: None   Collection Time: 03/08/20  9:55 PM   Specimen: Nasal Mucosa; Nasopharyngeal  Result Value Ref Range Status   MRSA by PCR NEGATIVE NEGATIVE Final    Comment:        The GeneXpert MRSA Assay (FDA approved for NASAL specimens only), is one component  of a comprehensive MRSA colonization surveillance program. It is not intended to diagnose MRSA infection nor to guide or monitor treatment for MRSA infections. Performed at [facility], [address], [place], [place] [address]             Assessment and plan: Acute respiratory failure tolerating BiPAP.  No distress however does not tolerate of BiPAP because of desaturation. ARDS with PF of 62 on 100% FiO2 BiPAP COVID-19 pneumonia. Bilateral airspace disease(improved). MRSA PCR -ve. Inflammatory markers are trending down. D-dimer elevation with COVID-19 related hypercoagulable state.PE was ruled out with CTA angio chest -Remdesivir, ivermectin and steroid -Therapeutic Lovenox because of progressively worsening respiratory status. -Monitor PF ratio -Empiric Rocephine -Consider intubation if not improving.  Prerenal azotemia with intravascular volume depletion and secondary to catabolic effect of the steroids.(improved) -Optimize volume status and monitor renal panel  Moderately controlled hypertension -Optimize antihypertensives and monitor hemodynamics.  Hyperglycemia -Glycemic control and taper of  steroid as tolerated with improvement of respiratory status  Anemia -Keep hemoglobin more than 7 g/dL.  Elevated alkaline phosphatase. -Monitor LFTs and follow with liver ultrasound if no improvement to rule out gallbladder disease.  Reactive thrombocytosis  DVT &GI prophylaxis. Continue with supportive care.   Critical care time 35 min

## 2020-03-14 ENCOUNTER — Inpatient Hospital Stay: Payer: Managed Care, Other (non HMO)

## 2020-03-14 ENCOUNTER — Inpatient Hospital Stay: Payer: Self-pay

## 2020-03-14 DIAGNOSIS — U071 COVID-19: Secondary | ICD-10-CM | POA: Diagnosis not present

## 2020-03-14 DIAGNOSIS — J9601 Acute respiratory failure with hypoxia: Secondary | ICD-10-CM | POA: Diagnosis not present

## 2020-03-14 DIAGNOSIS — J1282 Pneumonia due to Coronavirus disease 2019: Secondary | ICD-10-CM | POA: Diagnosis not present

## 2020-03-14 LAB — CBC
HCT: 37.6 % (ref 36.0–46.0)
Hemoglobin: 11.7 g/dL — ABNORMAL LOW (ref 12.0–15.0)
MCH: 25.2 pg — ABNORMAL LOW (ref 26.0–34.0)
MCHC: 31.1 g/dL (ref 30.0–36.0)
MCV: 80.9 fL (ref 80.0–100.0)
Platelets: 393 10*3/uL (ref 150–400)
RBC: 4.65 MIL/uL (ref 3.87–5.11)
RDW: 14.5 % (ref 11.5–15.5)
WBC: 11.3 10*3/uL — ABNORMAL HIGH (ref 4.0–10.5)
nRBC: 0 % (ref 0.0–0.2)

## 2020-03-14 LAB — BLOOD GAS, ARTERIAL
Acid-Base Excess: 6.3 mmol/L — ABNORMAL HIGH (ref 0.0–2.0)
Bicarbonate: 30.6 mmol/L — ABNORMAL HIGH (ref 20.0–28.0)
Delivery systems: POSITIVE
Expiratory PAP: 10
FIO2: 1
Inspiratory PAP: 15
Mechanical Rate: 12
O2 Saturation: 95.1 %
Patient temperature: 37
RATE: 12 resp/min
pCO2 arterial: 42 mmHg (ref 32.0–48.0)
pH, Arterial: 7.47 — ABNORMAL HIGH (ref 7.350–7.450)
pO2, Arterial: 71 mmHg — ABNORMAL LOW (ref 83.0–108.0)

## 2020-03-14 LAB — FIBRIN DERIVATIVES D-DIMER (ARMC ONLY): Fibrin derivatives D-dimer (ARMC): 6212.5 ng/mL (FEU) — ABNORMAL HIGH (ref 0.00–499.00)

## 2020-03-14 LAB — GLUCOSE, CAPILLARY
Glucose-Capillary: 103 mg/dL — ABNORMAL HIGH (ref 70–99)
Glucose-Capillary: 123 mg/dL — ABNORMAL HIGH (ref 70–99)
Glucose-Capillary: 139 mg/dL — ABNORMAL HIGH (ref 70–99)
Glucose-Capillary: 151 mg/dL — ABNORMAL HIGH (ref 70–99)
Glucose-Capillary: 156 mg/dL — ABNORMAL HIGH (ref 70–99)
Glucose-Capillary: 163 mg/dL — ABNORMAL HIGH (ref 70–99)

## 2020-03-14 LAB — COMPREHENSIVE METABOLIC PANEL
ALT: 20 U/L (ref 0–44)
AST: 34 U/L (ref 15–41)
Albumin: 2.9 g/dL — ABNORMAL LOW (ref 3.5–5.0)
Alkaline Phosphatase: 187 U/L — ABNORMAL HIGH (ref 38–126)
Anion gap: 13 (ref 5–15)
BUN: 21 mg/dL — ABNORMAL HIGH (ref 6–20)
CO2: 27 mmol/L (ref 22–32)
Calcium: 8 mg/dL — ABNORMAL LOW (ref 8.9–10.3)
Chloride: 105 mmol/L (ref 98–111)
Creatinine, Ser: 0.57 mg/dL (ref 0.44–1.00)
GFR calc Af Amer: >60 mL/min (ref >60)
GFR calc non Af Amer: >60 mL/min (ref >60)
Glucose, Bld: 141 mg/dL — ABNORMAL HIGH (ref 70–99)
Potassium: 3.6 mmol/L (ref 3.5–5.1)
Sodium: 145 mmol/L (ref 135–145)
Total Bilirubin: 0.9 mg/dL (ref 0.3–1.2)
Total Protein: 6.1 g/dL — ABNORMAL LOW (ref 6.5–8.1)

## 2020-03-14 LAB — C-REACTIVE PROTEIN: CRP: 0.7 mg/dL (ref <1.0)

## 2020-03-14 MED ORDER — SODIUM CHLORIDE 0.9% FLUSH
10.0000 mL | Freq: Two times a day (BID) | INTRAVENOUS | Status: DC
  Administered 2020-03-14 – 2020-03-16 (×6): 10 mL

## 2020-03-14 MED ORDER — ACETAZOLAMIDE SODIUM 500 MG IJ SOLR
500.0000 mg | Freq: Once | INTRAMUSCULAR | Status: AC
  Administered 2020-03-14: 500 mg via INTRAVENOUS
  Filled 2020-03-14: qty 500

## 2020-03-14 MED ORDER — SODIUM CHLORIDE 0.9% FLUSH
10.0000 mL | INTRAVENOUS | Status: DC | PRN
  Administered 2020-03-14: 10 mL

## 2020-03-14 MED ORDER — ENOXAPARIN SODIUM 40 MG/0.4ML ~~LOC~~ SOLN
40.0000 mg | Freq: Two times a day (BID) | SUBCUTANEOUS | Status: DC
  Administered 2020-03-14 – 2020-03-16 (×4): 40 mg via SUBCUTANEOUS
  Filled 2020-03-14 (×4): qty 0.4

## 2020-03-14 MED ORDER — LORAZEPAM 2 MG/ML IJ SOLN
1.0000 mg | Freq: Once | INTRAMUSCULAR | Status: AC
  Administered 2020-03-14: 1 mg via INTRAVENOUS
  Filled 2020-03-14: qty 1

## 2020-03-14 NOTE — Progress Notes (Signed)
Peripherally Inserted Central Catheter Placement  The IV Nurse has discussed with the patient and/or persons authorized to consent for the patient, the purpose of this procedure and the potential benefits and risks involved with this procedure.  The benefits include less needle sticks, lab draws from the catheter, and the patient may be discharged home with the catheter. Risks include, but not limited to, infection, bleeding, blood clot (thrombus formation), and puncture of an artery; nerve damage and irregular heartbeat and possibility to perform a PICC exchange if needed/ordered by physician.  Alternatives to this procedure were also discussed.  Bard Power PICC patient education guide, fact sheet on infection prevention and patient information card has been provided to patient /or left at bedside.    PICC Placement Documentation  PICC Triple Lumen 00/63/49 PICC Right Basilic 38 cm 2 cm (Active)  Indication for Insertion or Continuance of Line Administration of hyperosmolar/irritating solutions (i.e. TPN, Vancomycin, etc.) 03/14/20 1338  Exposed Catheter (cm) 2 cm 03/14/20 1338  Site Assessment Clean;Dry;Intact 03/14/20 1338  Lumen #1 Status Flushed;Saline locked;Blood return noted 03/14/20 1338  Lumen #2 Status Flushed;Saline locked;Blood return noted 03/14/20 1338  Lumen #3 Status Flushed;Saline locked;Blood return noted 03/14/20 1338  Dressing Type Transparent 03/14/20 1338  Dressing Status Clean;Dry;Intact;Antimicrobial disc in place 03/14/20 1338  Safety Lock Not Applicable 49/44/73 9584  Dressing Intervention New dressing 03/14/20 1338  Dressing Change Due 03/21/20 03/14/20 1338       Gordan Payment 03/14/2020, 1:39 PM

## 2020-03-14 NOTE — Progress Notes (Signed)
PROGRESS NOTE    Kathleen Espinoza  ZYS:[id number] DOB: 05-21-1971 DOA: 03/12/2020 PCP: Einar Pheasant, MD   Brief Narrative:   Kathleen Espinoza is a 49 y.o. female with medical history significant of hypertension and dyslipidemia came to ED with complaint of worsening generalized weakness and shortness of breath. Patient started cough and mild exertional dyspnea approximately 11 to 12 days ago, due to persistent symptoms and some worsening she was tested for Covid at [facility], no documentation 7-8 days ago and it was positive.  Husband with a similar symptoms and also admitted last night. She was hypoxic on arrival, chest x-ray with bilateral infiltrate consistent with COVID-19 pneumonia, elevated inflammatory markers.  Initially requiring 4 to 6 L.  Deteriorated later on, now on BiPAP in stepdown.  Subjective: Patient continued to become hypoxic with small body movements in the bed even on BiPAP.  Little tachypneic and appears lethargic today.  Assessment & Plan:   Active Problems:   Pneumonia due to COVID-19 virus  Acute hypoxic respiratory failure secondary to COVID-19 pneumonia. Clinical symptoms, labs and imaging is consistent with COVID-19 pneumonia.  Elevated inflammatory markers, elevation in D-dimer with marked improvement in CRP. Borderline elevation of AST and ALT, stable at this time.  Worsening alkaline phosphatase.  Received 2 doses of Actemra, last dose on 03/10/2020.  Completed the course of remdesivir, received ivermectin.  Continue to require BiPAP to maintain saturation above 88% and desaturate very easily up to 60s with very minor movements or taking the mask off from mouth for food. As patient appears more lethargic and tachypneic today.  Discussed with PCCM-they will take over as she might need intubation soon. -Continue with Solu-Medrol 40 mg daily, -Continue to monitor inflammatory markers. -Prone patient as tolerated. -Continue with supportive care. -Continue ceftriaxone  and azithromycin.  New diagnosis of diabetes.  A1c elevated at 7.3.  CBG elevated. -Consult to diabetes coordinator. -Continue with SSI -Continue Levemir to 8 units twice daily. -Increase mealtime coverage to 6 units of NovoLog if she eats more than 50% of her meals  Transaminitis.  Mildly elevated AST and ALT.no prior history of hep B or C. Hep C and hep B screening was negative.  Currently stable. Worsening alkaline phosphatase. -Continue to monitor as patient is on remdesivir and also received two dose of Actemra. -Continue to monitor-we will obtained RUQ ultrasound if continue to get worsen.  Hypertension.  Blood pressure within goal today. Patient was on lisinopril and HCTZ at home. -Hold lisinopril and HCTZ both at this time, she was only started on low-dose lisinopril due to elevated blood pressures before and resulted in softer blood pressure.  Hyperlipidemia. -Holding home dose of statin due to mildly elevated transaminitis.  Objective: Vitals:   03/14/20 0900 03/14/20 1000 03/14/20 1100 03/14/20 1200  BP: 125/70 111/69 (!) 104/39 (!) 108/92  Pulse: 63 61 78 81  Resp: 19 (!) 21 (!) 26 (!) 24  Temp:    97.7 F (36.5 C)  TempSrc:    Axillary  SpO2: 95% 91% (!) 86% 93%  Weight:      Height:        Intake/Output Summary (Last 24 hours) at 03/14/2020 1346 Last data filed at 03/14/2020 1200 Gross per 24 hour  Intake 350.03 ml  Output 2035 ml  Net -1684.97 ml   Filed Weights   03/12/20 0600 03/12/20 1900 03/14/20 0405  Weight: 102.7 kg 102.8 kg 99.9 kg    Examination:  General.  Well-developed lady, appears lethargic.  Pulmonary.   Mildly reduced air entry bilaterally CV.  Regular rate and rhythm, no JVD, rub or murmur. Abdomen.  Soft, nontender, nondistended, BS positive. CNS.  Alert and oriented x3.  No focal neurologic deficit. Extremities.  No edema, no cyanosis, pulses intact and symmetrical. Psychiatry.  Judgment and insight appears normal.  DVT  prophylaxis: Lovenox Code Status: Full Family Communication: Updated the daughter on phone. Disposition Plan:  Status is: Inpatient  Remains inpatient appropriate because:Inpatient level of care appropriate due to severity of illness   Dispo: The patient is from: Home              Anticipated d/c is to: Home              Anticipated d/c date is: 3 days              Patient currently is not medically stable to d/c.  Patient is currently seriously ill and being managed in ICU.  Patient is very high risk for intubation, deterioration and death.  Consultants:   Pulmonary  Procedures:  Antimicrobials:  Ceftriaxone Azithromycin  Data Reviewed: I have personally reviewed following labs and imaging studies  CBC: Recent Labs  Lab 03/09/20 0439 03/09/20 0439 03/10/20 0300 03/11/20 0136 03/12/20 0529 03/13/20 1056 03/14/20 0815  WBC 9.5   < > 14.2* 15.4* 19.1* 15.4* 11.3*  NEUTROABS 7.8*  --  12.4* 14.0* 17.5* 14.0*  --   HGB 10.9*   < > 10.4* 11.4* 11.5* 12.6 11.7*  HCT 34.3*   < > 33.2* 34.7* 35.6* 38.4 37.6  MCV 80.5   < > 80.8 78.0* 78.1* 77.7* 80.9  PLT 309   < > 350 410* 421* 437* 393   < > = values in this interval not displayed.   Basic Metabolic Panel: Recent Labs  Lab 03/09/20 0439 03/09/20 0439 03/10/20 0300 03/11/20 0136 03/12/20 0529 03/13/20 1056 03/14/20 0815  NA 145   < > 147* 146* 143 145 145  K 3.5   < > 3.8 4.5 3.7 3.3* 3.6  CL 106   < > 106 105 101 102 105  CO2 26   < > 29 30 29 29 27   GLUCOSE 252*   < > 247* 223* 245* 151* 141*  BUN 22*   < > 26* 27* 24* 24* 21*  CREATININE 0.86   < > 0.81 0.84 0.67 0.72 0.57  CALCIUM 8.1*   < > 8.2* 8.0* 7.7* 7.8* 8.0*  MG 2.0  --  2.3 2.4 2.3 2.3  --   PHOS 2.9  --  3.6 4.4 4.7* 3.2  --    < > = values in this interval not displayed.   GFR: Estimated Creatinine Clearance: 93.1 mL/min (by C-G formula based on SCr of 0.57 mg/dL). Liver Function Tests: Recent Labs  Lab 03/10/20 0300 03/11/20 0136  03/12/20 0529 03/13/20 1056 03/14/20 0815  AST 38 40 35 39 34  ALT 45* 46* 32 22 20  ALKPHOS 80 120 190* 219* 187*  BILITOT 0.5 0.6 0.8 1.0 0.9  PROT 6.3* 6.7 6.3* 6.2* 6.1*  ALBUMIN 2.7* 2.9* 2.9* 3.0* 2.9*   No results for input(s): LIPASE, AMYLASE in the last 168 hours. No results for input(s): AMMONIA in the last 168 hours. Coagulation Profile: Recent Labs  Lab 03/11/20 0136  INR 1.1   Cardiac Enzymes: No results for input(s): CKTOTAL, CKMB, CKMBINDEX, TROPONINI in the last 168 hours. BNP (last 3 results) No results for input(s): PROBNP in the  last 8760 hours. HbA1C: No results for input(s): HGBA1C in the last 72 hours. CBG: Recent Labs  Lab 03/13/20 2026 03/14/20 0011 03/14/20 0402 03/14/20 0719 03/14/20 1141  GLUCAP 176* 139* 123* 103* 151*   Lipid Profile: No results for input(s): CHOL, HDL, LDLCALC, TRIG, CHOLHDL, LDLDIRECT in the last 72 hours. Thyroid Function Tests: No results for input(s): TSH, T4TOTAL, FREET4, T3FREE, THYROIDAB in the last 72 hours. Anemia Panel: Recent Labs    03/12/20 0529  FERRITIN 169   Sepsis Labs: Recent Labs  Lab 03/22/2020 2014 03/08/20 0719 03/09/20 0439 03/10/20 0300 03/12/20 1024  PROCALCITON 0.13 <0.10 <0.10 <0.10  --   LATICACIDVEN  --   --   --   --  1.9    Recent Results (from the past 240 hour(s))  SARS Coronavirus 2 by RT PCR (hospital order, performed in [facility] hospital lab) Nasopharyngeal Nasopharyngeal Swab     Status: Abnormal   Collection Time: 03/16/2020  3:30 PM   Specimen: Nasopharyngeal Swab  Result Value Ref Range Status   SARS Coronavirus 2 POSITIVE (A) NEGATIVE Final    Comment: RESULT CALLED TO, READ BACK BY AND VERIFIED WITH: JENNIFER WHITLEY RN AT 9357 ON 03/09/2020 SNG (NOTE) SARS-CoV-2 target nucleic acids are DETECTED  SARS-CoV-2 RNA is generally detectable in upper respiratory specimens  during the acute phase of infection.  Positive results are indicative  of the presence of the  identified virus, but do not rule out bacterial infection or co-infection with other pathogens not detected by the test.  Clinical correlation with patient history and  other diagnostic information is necessary to determine patient infection status.  The expected result is negative.  Fact Sheet for Patients:   StrictlyIdeas.no   Fact Sheet for Healthcare Providers:   BankingDealers.co.za    This test is not yet approved or cleared by the [place] FDA and  has been authorized for detection and/or diagnosis of SARS-CoV-2 by FDA under an Emergency Use Authorization (EUA).  This EUA will remain in effect (meaning  this test can be used) for the duration of  the COVID-19 declaration under Section 564(b)(1) of the Act, 21 U.S.C. section 360-bbb-3(b)(1), unless the authorization is terminated or revoked sooner.  Performed at [facility], [address] [place], [place] [address]   Blood culture (routine x 2)     Status: None   Collection Time: 03/05/2020  3:30 PM   Specimen: BLOOD  Result Value Ref Range Status   Specimen Description BLOOD BLOOD RIGHT ARM  Final   Special Requests   Final    BOTTLES DRAWN AEROBIC AND ANAEROBIC Blood Culture adequate volume   Culture   Final    NO GROWTH 5 DAYS Performed at [facility], [address], [place], [place] [address]    Report Status 03/12/2020 FINAL  Final  Blood culture (routine x 2)     Status: None   Collection Time: 03/15/2020  3:30 PM   Specimen: BLOOD  Result Value Ref Range Status   Specimen Description BLOOD BLOOD LEFT ARM  Final   Special Requests   Final    BOTTLES DRAWN AEROBIC AND ANAEROBIC Blood Culture adequate volume   Culture   Final    NO GROWTH 5 DAYS Performed at [facility], [address] [place], [place] [address]    Report Status 03/12/2020 FINAL  Final  MRSA PCR Screening     Status: None   Collection Time: 03/08/20  9:55 PM  Specimen: Nasal Mucosa; Nasopharyngeal  Result Value Ref Range Status   MRSA by PCR NEGATIVE NEGATIVE Final    Comment:        The GeneXpert MRSA Assay (FDA approved for NASAL specimens only), is one component of a comprehensive MRSA colonization surveillance program. It is not intended to diagnose MRSA infection nor to guide or monitor treatment for MRSA infections. Performed at [facility], [address] [place],  [address]      Radiology Studies: [facility] Chest [place] 1 View  Result Date: 03/14/2020 CLINICAL DATA:  COVID pneumonia. EXAM: PORTABLE CHEST 1 VIEW COMPARISON:  03/14/2019 FINDINGS: The cardiac silhouette, mediastinal and hilar contours are stable. Persistent diffuse interstitial and airspace process in the lungs. No pleural effusion or pneumothorax. IMPRESSION: Persistent diffuse interstitial and airspace process. Electronically Signed   By: Marijo Sanes M.D.   On: 03/14/2020 07:54   DG Chest Port 1 View  Result Date: 03/13/2020 CLINICAL DATA:  Follow-up bilateral airspace opacities EXAM: PORTABLE CHEST 1 VIEW COMPARISON:  03/12/2020 FINDINGS: Cardiac shadow is stable. Diffuse airspace opacities are again identified bilaterally consistent with the given clinical history of COVID-19 positivity. No bony abnormality is noted. IMPRESSION: No change when compare with the prior exam. Electronically Signed   By: Inez Catalina M.D.   On: 03/13/2020 05:47   [place] EKG SITE RITE  Result Date: 03/14/2020 If Site Rite image not attached, placement could not be confirmed due to current cardiac rhythm.  US Abdomen Limited RUQ  Result Date: 03/13/2020 CLINICAL DATA:  Concern for cholecystitis. EXAM: ULTRASOUND ABDOMEN LIMITED RIGHT UPPER QUADRANT COMPARISON:  CT abdomen pelvis dated 09/14/2017. FINDINGS: Gallbladder: No gallstones or wall thickening visualized. No sonographic Murphy sign noted by sonographer. Common bile duct: Diameter: 4 mm Liver: No focal lesion  identified. Increased parenchymal echogenicity. Portal vein is patent on color Doppler imaging with normal direction of blood flow towards the liver. Other: None. IMPRESSION: 1.  No sonographic evidence of acute cholecystitis. 2.  Increased liver echogenicity likely reflects hepatic steatosis. Electronically Signed   By: Zerita Boers M.D.   On: 03/13/2020 12:41    Scheduled Meds: . vitamin C  500 mg Oral Daily  . Chlorhexidine Gluconate Cloth  6 each Topical Daily  . enoxaparin (LOVENOX) injection  40 mg Subcutaneous Q12H  . feeding supplement (NEPRO CARB STEADY)  237 mL Oral TID BM  . insulin aspart  0-20 Units Subcutaneous Q4H  . insulin detemir  8 Units Subcutaneous BID  . Ipratropium-Albuterol  1 puff Inhalation Q6H  . mouth rinse  15 mL Mouth Rinse BID  . melatonin  2.5 mg Oral QHS  . methylPREDNISolone (SOLU-MEDROL) injection  40 mg Intravenous Daily  . multivitamin with minerals  1 tablet Oral Daily  . pantoprazole (PROTONIX) IV  40 mg Intravenous Q24H  . sodium chloride flush  10-40 mL Intracatheter Q12H  . sodium chloride flush  10-40 mL Intracatheter Q12H  . sodium chloride flush  3 mL Intravenous Q12H  . zinc sulfate  220 mg Oral Daily   Continuous Infusions: . azithromycin Stopped (03/13/20 1847)  . cefTRIAXone (ROCEPHIN)  IV Stopped (03/13/20 1648)     LOS: 7 days   Time spent: 45 minutes.  Lorella Nimrod, MD [facility]  If 7PM-7AM, please contact night-coverage [email or web address]  03/14/2020, 1:46 PM   This record has been created using Systems analyst. Errors have been sought and corrected,but may not always be located. Such creation errors do not reflect on  the standard of care.

## 2020-03-14 NOTE — Progress Notes (Signed)
Shift Summary: Pt AXOX4. Maintains mobility of extremities. Temperature WNL. BP WNL. Pt continues on 100% O2 on the BIPAP. SPO2 fluctuated from as low as 80% to mostly maintaining greater than 88%. Pt intermittently proned to improve oxygenation. Pt still NPO. PICC placed for probable initiation of TPN tomorrow. No BM. Foley in place. UOP WNL. Diamox given per order. Pt responded well. Skin intact.

## 2020-03-14 NOTE — Progress Notes (Signed)
Assisted tele visit to patient with family member.  Rudolph Daoust Ann, RN  

## 2020-03-14 NOTE — Plan of Care (Signed)

## 2020-03-14 NOTE — Progress Notes (Signed)
Video call with husband. Patient BiPap dependent. Nods head appropriately. Unable to hear her due to bipap

## 2020-03-14 NOTE — Progress Notes (Signed)
Nutrition Follow-up  DOCUMENTATION CODES:   Morbid obesity  INTERVENTION:  Plan is to initiate TPN tomorrow per pharmacy.  Once enteral access is able to be safely obtained recommend placement so enteral nutrition can be initiated.  NUTRITION DIAGNOSIS:   Increased nutrient needs related to catabolic illness (COVID 19) as evidenced by estimated needs.  Ongoing.  GOAL:   Patient will meet greater than or equal to 90% of their needs  Not met.  MONITOR:   PO intake, Supplement acceptance, Labs, Weight trends, Skin, I & O's  REASON FOR ASSESSMENT:   Consult Assessment of nutrition requirement/status  ASSESSMENT:   49 y.o. female with a past medical history of hypertension and hyperlipidemia who is admitted with COVID 66  Discussed with RN and on rounds. Patient on BiPAP with FiO2 100%. Patient unable to wean off BiPAP and would like to avoid intubation for now. Patient unable to tolerate being off BiPAP long enough to eat or drink. Diet order has now been cancelled. RD recommended placement of NGT for initiation of tube feeds. Per discussion with team concern that patient could not tolerate placement of NGT as she cannot tolerate time off BiPAP and also concern that NGT would break seal on BiPAP. Plan is for placement of PICC today and initiation of TPN tomorrow. Patient is at high risk for intubation. Once enteral access is able to be safely obtained it would be ideal to feed patient enterally. Using the GI system for enteral feeding helps prevent gut mucosal atrophy, reduces septic complications by decreasing bacterial translocation, stimulates gut motility therefore reducing the risk of ileus, and enhances the intestinal immune system.  Medications reviewed and include: vitamin C 500 mg daily, Novolog 0-20 units Q4hrs, Levemir 8 units BID, Solu-Medrol 40 mg daily IV, MVI daily, Protonix, zinc sulfate 220 mg daily, azithromycin, ceftriaxone.  Labs reviewed: CBG 103-151, BUN  21.  IV Access: right basilic triple lumen PICC placed 8/16  I/O: 1735 mL UOP yesterday (0.7 mL/kg/hr)  Weight trend: 99.9 kg on 8/16; -11.2 kg from 8/9 but unsure if wt from 8/9 was a true measured weight  Diet Order:   Diet Order    None     EDUCATION NEEDS:   No education needs have been identified at this time  Skin:  Skin Assessment: Reviewed RN Assessment  Last BM:  Unknown  Height:   Ht Readings from Last 1 Encounters:  03/29/2020 5' 1"  (1.549 m)   Weight:   Wt Readings from Last 1 Encounters:  03/14/20 99.9 kg   Ideal Body Weight:  47.7 kg  BMI:  Body mass index is 41.61 kg/m.  Estimated Nutritional Needs:   Kcal:  2300-2600kcal/day  Protein:  120-130 grams  Fluid:  2 L/day  Jacklynn Barnacle, MS, RD, LDN Pager number available on Amion

## 2020-03-14 NOTE — Progress Notes (Signed)
CRITICAL CARE NOTE Admitted for Severe resp failure,On biPAP, COVID 19 pneumonia   CC  follow up respiratory failure  SUBJECTIVE Patient remains critically ill Prognosis is guarded High risk for intubation and cardiac arrest Alert and awake Wants to stay on biPAP   BP 113/76   Pulse 65   Temp 98.6 F (37 C) (Axillary)   Resp (!) 23   Ht 5\' 1"  (1.549 m)   Wt 99.9 kg   LMP 03/10/2020   SpO2 92%   BMI 41.61 kg/m    I/O last 3 completed shifts: In: 350 [IV Piggyback:350] Out: 2285 [Urine:2285] No intake/output data recorded.  SpO2: 92 % O2 Flow Rate (L/min): 50 L/min FiO2 (%): 100 %  Estimated body mass index is 41.61 kg/m as calculated from the following:   Height as of this encounter: 5\' 1"  (1.549 m).   Weight as of this encounter: 99.9 kg.  SIGNIFICANT EVENTS   REVIEW OF SYSTEMS  PATIENT IS UNABLE TO PROVIDE COMPLETE REVIEW OF SYSTEMS DUE TO SEVERE CRITICAL ILLNESS        PHYSICAL EXAMINATION:  GENERAL:critically ill appearing, +resp distress HEAD: Normocephalic, atraumatic.  EYES: Pupils equal, round, reactive to light.  No scleral icterus.  MOUTH: Moist mucosal membrane. NECK: Supple.  PULMONARY: +rhonchi, +wheezing CARDIOVASCULAR: S1 and S2. Regular rate and rhythm. No murmurs, rubs, or gallops.  GASTROINTESTINAL: Soft, nontender, -distended.  Positive bowel sounds.   MUSCULOSKELETAL: No swelling, clubbing, or edema.  NEUROLOGIC: alert and awake, cant talk due to severe Resp distress SKIN:intact,warm,dry  MEDICATIONS: I have reviewed all medications and confirmed regimen as documented   CULTURE RESULTS   Recent Results (from the past 240 hour(s))  SARS Coronavirus 2 by RT PCR (hospital order, performed in [facility] hospital lab) Nasopharyngeal Nasopharyngeal Swab     Status: Abnormal   Collection Time: 03/23/2020  3:30 PM   Specimen: Nasopharyngeal Swab  Result Value Ref Range Status   SARS Coronavirus 2 POSITIVE (A) NEGATIVE Final     Comment: RESULT CALLED TO, READ BACK BY AND VERIFIED WITH: JENNIFER WHITLEY RN AT 7616 ON 03/05/2020 SNG (NOTE) SARS-CoV-2 target nucleic acids are DETECTED  SARS-CoV-2 RNA is generally detectable in upper respiratory specimens  during the acute phase of infection.  Positive results are indicative  of the presence of the identified virus, but do not rule out bacterial infection or co-infection with other pathogens not detected by the test.  Clinical correlation with patient history and  other diagnostic information is necessary to determine patient infection status.  The expected result is negative.  Fact Sheet for Patients:   StrictlyIdeas.no   Fact Sheet for Healthcare Providers:   BankingDealers.co.za    This test is not yet approved or cleared by the [place] FDA and  has been authorized for detection and/or diagnosis of SARS-CoV-2 by FDA under an Emergency Use Authorization (EUA).  This EUA will remain in effect (meaning  this test can be used) for the duration of  the COVID-19 declaration under Section 564(b)(1) of the Act, 21 U.S.C. section 360-bbb-3(b)(1), unless the authorization is terminated or revoked sooner.  Performed at [facility], [address] [place], [place] [address]   Blood culture (routine x 2)     Status: None   Collection Time: 03/16/2020  3:30 PM   Specimen: BLOOD  Result Value Ref Range Status   Specimen Description BLOOD BLOOD RIGHT ARM  Final   Special Requests   Final    BOTTLES DRAWN AEROBIC  AND ANAEROBIC Blood Culture adequate volume   Culture   Final    NO GROWTH 5 DAYS Performed at [facility], [address] [place], [place] [address]    Report Status 03/12/2020 FINAL  Final  Blood culture (routine x 2)     Status: None   Collection Time: 03/02/2020  3:30 PM   Specimen: BLOOD  Result Value Ref Range Status   Specimen Description BLOOD BLOOD LEFT ARM  Final   Special  Requests   Final    BOTTLES DRAWN AEROBIC AND ANAEROBIC Blood Culture adequate volume   Culture   Final    NO GROWTH 5 DAYS Performed at [facility], [address] [place], [place] [address]    Report Status 03/12/2020 FINAL  Final  MRSA PCR Screening     Status: None   Collection Time: 03/08/20  9:55 PM   Specimen: Nasal Mucosa; Nasopharyngeal  Result Value Ref Range Status   MRSA by PCR NEGATIVE NEGATIVE Final    Comment:        The GeneXpert MRSA Assay (FDA approved for NASAL specimens only), is one component of a comprehensive MRSA colonization surveillance program. It is not intended to diagnose MRSA infection nor to guide or monitor treatment for MRSA infections. Performed at [facility], [address] [place], [place] [address]           IMAGING    DG Chest Port 1 View  Result Date: 03/14/2020 CLINICAL DATA:  COVID pneumonia. EXAM: PORTABLE CHEST 1 VIEW COMPARISON:  03/14/2019 FINDINGS: The cardiac silhouette, mediastinal and hilar contours are stable. Persistent diffuse interstitial and airspace process in the lungs. No pleural effusion or pneumothorax. IMPRESSION: Persistent diffuse interstitial and airspace process. Electronically Signed   By: Marijo Sanes M.D.   On: 03/14/2020 07:54   US Abdomen Limited RUQ  Result Date: 03/13/2020 CLINICAL DATA:  Concern for cholecystitis. EXAM: ULTRASOUND ABDOMEN LIMITED RIGHT UPPER QUADRANT COMPARISON:  CT abdomen pelvis dated 09/14/2017. FINDINGS: Gallbladder: No gallstones or wall thickening visualized. No sonographic Murphy sign noted by sonographer. Common bile duct: Diameter: 4 mm Liver: No focal lesion identified. Increased parenchymal echogenicity. Portal vein is patent on color Doppler imaging with normal direction of blood flow towards the liver. Other: None. IMPRESSION: 1.  No sonographic evidence of acute cholecystitis. 2.  Increased liver echogenicity likely reflects hepatic steatosis.  Electronically Signed   By: Zerita Boers M.D.   On: 03/13/2020 12:41     Nutrition Status: Nutrition Problem: Increased nutrient needs Etiology: catabolic illness (COVID 19) Signs/Symptoms: estimated needs    CBC    Component Value Date/Time   WBC 11.3 (H) 03/14/2020 0815   RBC 4.65 03/14/2020 0815   HGB 11.7 (L) 03/14/2020 0815   HGB 13.3 10/02/2018 0826   HCT 37.6 03/14/2020 0815   HCT 37.3 10/02/2018 0826   PLT 393 03/14/2020 0815   PLT 330 10/02/2018 0826   MCV 80.9 03/14/2020 0815   MCV 81 10/02/2018 0826   MCH 25.2 (L) 03/14/2020 0815   MCHC 31.1 03/14/2020 0815   RDW 14.5 03/14/2020 0815   RDW 12.8 10/02/2018 0826   LYMPHSABS 0.5 (L) 03/13/2020 1056   LYMPHSABS 2.4 10/02/2018 0826   MONOABS 0.4 03/13/2020 1056   EOSABS 0.3 03/13/2020 1056   EOSABS 0.4 10/02/2018 0826   BASOSABS 0.0 03/13/2020 1056   BASOSABS 0.1 10/02/2018 0826   BMP Latest Ref Rng & Units 03/14/2020 03/13/2020 03/12/2020  Glucose 70 - 99 mg/dL  141(H) 151(H) 245(H)  BUN 6 - 20 mg/dL 21(H) 24(H) 24(H)  Creatinine 0.44 - 1.00 mg/dL 0.57 0.72 0.67  BUN/Creat Ratio 9 - 23 - - -  Sodium 135 - 145 mmol/L 145 145 143  Potassium 3.5 - 5.1 mmol/L 3.6 3.3(L) 3.7  Chloride 98 - 111 mmol/L 105 102 101  CO2 22 - 32 mmol/L 27 29 29   Calcium 8.9 - 10.3 mg/dL 8.0(L) 7.8(L) 7.7(L)        ASSESSMENT AND PLAN SYNOPSIS  Severe COVID-19 infection, ARDS and pneumonia/pneumonitis Continue IV steroids  IV remdisivir Aggressive pulm toilet recommended Pulmonary hygiene Continue proning as tolerated due to severe hypoxia   Maintain airborne and contact precautions  As needed bronchodilators (MDI) Vitamin C and zinc Antitussives High risk for intubation and death   Morbid obesity, possible OSA.   Will certainly impact respiratory mechanics,      NEUROLOGY Alert and awake    CARDIAC ICU monitoring  GI GI PROPHYLAXIS as indicated  NUTRITIONAL STATUS Nutrition Status: Nutrition Problem:  Increased nutrient needs Etiology: catabolic illness (COVID 19) Signs/Symptoms: estimated needs     DIET-->NPO Constipation protocol as indicated  ENDO - will use ICU hypoglycemic\Hyperglycemia protocol if indicated     ELECTROLYTES -follow labs as needed -replace as needed -pharmacy consultation and following   DVT/GI PRX ordered and assessed TRANSFUSIONS AS NEEDED MONITOR FSBS I Assessed the need for Labs I Assessed the need for Foley I Assessed the need for Central Venous Line Family Discussion when available I Assessed the need for Mobilization I made an Assessment of medications to be adjusted accordingly Safety Risk assessment completed   CASE DISCUSSED IN MULTIDISCIPLINARY ROUNDS WITH ICU TEAM  Critical Care Time devoted to patient care services described in this note is 32 minutes.   Overall, patient is critically ill, prognosis is guarded.  Patient with high risk for cardiac arrest and death.    Corrin Parker, M.D.  [facility]  Medical Director [employer] Director [facility] Cardio-Pulmonary Department

## 2020-03-15 ENCOUNTER — Inpatient Hospital Stay: Payer: Managed Care, Other (non HMO)

## 2020-03-15 ENCOUNTER — Inpatient Hospital Stay
Admit: 2020-03-15 | Discharge: 2020-03-15 | Disposition: A | Discharge status: Home / Self Care | Payer: Managed Care, Other (non HMO) | Attending: Pulmonary Disease | Admitting: Pulmonary Disease

## 2020-03-15 DIAGNOSIS — U071 COVID-19: Secondary | ICD-10-CM | POA: Diagnosis not present

## 2020-03-15 DIAGNOSIS — J80 Acute respiratory distress syndrome: Secondary | ICD-10-CM | POA: Diagnosis not present

## 2020-03-15 DIAGNOSIS — J1282 Pneumonia due to Coronavirus disease 2019: Secondary | ICD-10-CM | POA: Diagnosis not present

## 2020-03-15 LAB — BLOOD GAS, ARTERIAL
Acid-base deficit: 10.4 mmol/L — ABNORMAL HIGH (ref 0.0–2.0)
Acid-base deficit: 12.6 mmol/L — ABNORMAL HIGH (ref 0.0–2.0)
Acid-base deficit: 12.2 mmol/L — ABNORMAL HIGH (ref 0.0–2.0)
Bicarbonate: 21.3 mmol/L (ref 20.0–28.0)
Bicarbonate: 19.6 mmol/L — ABNORMAL LOW (ref 20.0–28.0)
Bicarbonate: 17.7 mmol/L — ABNORMAL LOW (ref 20.0–28.0)
FIO2: 1
FIO2: 1
FIO2: 1
MECHVT: 400 mL
MECHVT: 400 mL
MECHVT: 480 mL
Mechanical Rate: 24
Mechanical Rate: 30
Mechanical Rate: 30
O2 Saturation: 85.7 %
O2 Saturation: 77.5 %
O2 Saturation: 95 %
PEEP: 12 cmH2O
PEEP: 12 cmH2O
PEEP: 12 cmH2O
Patient temperature: 37
Patient temperature: 37
Patient temperature: 37
RATE: 30 resp/min
RATE: 30 resp/min
pCO2 arterial: 77 mmHg (ref 32.0–48.0)
pCO2 arterial: 76 mmHg (ref 32.0–48.0)
pCO2 arterial: 57 mmHg — ABNORMAL HIGH (ref 32.0–48.0)
pH, Arterial: 7.05 — CL (ref 7.350–7.450)
pH, Arterial: 7.02 — CL (ref 7.350–7.450)
pH, Arterial: 7.1 — CL (ref 7.350–7.450)
pO2, Arterial: 73 mmHg — ABNORMAL LOW (ref 83.0–108.0)
pO2, Arterial: 63 mmHg — ABNORMAL LOW (ref 83.0–108.0)
pO2, Arterial: 101 mmHg (ref 83.0–108.0)

## 2020-03-15 LAB — COMPREHENSIVE METABOLIC PANEL
ALT: 22 U/L (ref 0–44)
AST: 42 U/L — ABNORMAL HIGH (ref 15–41)
Albumin: 3 g/dL — ABNORMAL LOW (ref 3.5–5.0)
Alkaline Phosphatase: 216 U/L — ABNORMAL HIGH (ref 38–126)
Anion gap: 14 (ref 5–15)
BUN: 30 mg/dL — ABNORMAL HIGH (ref 6–20)
CO2: 19 mmol/L — ABNORMAL LOW (ref 22–32)
Calcium: 7.2 mg/dL — ABNORMAL LOW (ref 8.9–10.3)
Chloride: 110 mmol/L (ref 98–111)
Creatinine, Ser: 1.76 mg/dL — ABNORMAL HIGH (ref 0.44–1.00)
GFR calc Af Amer: 39 mL/min — ABNORMAL LOW (ref >60)
GFR calc non Af Amer: 34 mL/min — ABNORMAL LOW (ref >60)
Glucose, Bld: 237 mg/dL — ABNORMAL HIGH (ref 70–99)
Potassium: 4.7 mmol/L (ref 3.5–5.1)
Sodium: 143 mmol/L (ref 135–145)
Total Bilirubin: 0.6 mg/dL (ref 0.3–1.2)
Total Protein: 6.3 g/dL — ABNORMAL LOW (ref 6.5–8.1)

## 2020-03-15 LAB — BLOOD GAS, VENOUS
Acid-base deficit: 13.9 mmol/L — ABNORMAL HIGH (ref 0.0–2.0)
Bicarbonate: 19.3 mmol/L — ABNORMAL LOW (ref 20.0–28.0)
FIO2: 1
MECHVT: 480 mL
Mechanical Rate: 30
O2 Saturation: 37 %
PEEP: 12 cmH2O
Patient temperature: 37
RATE: 30 resp/min
pCO2, Ven: 82 mmHg (ref 44.0–60.0)
pH, Ven: 6.98 — CL (ref 7.250–7.430)
pO2, Ven: 36 mmHg (ref 32.0–45.0)

## 2020-03-15 LAB — GLUCOSE, CAPILLARY
Glucose-Capillary: 129 mg/dL — ABNORMAL HIGH (ref 70–99)
Glucose-Capillary: 197 mg/dL — ABNORMAL HIGH (ref 70–99)
Glucose-Capillary: 202 mg/dL — ABNORMAL HIGH (ref 70–99)
Glucose-Capillary: 210 mg/dL — ABNORMAL HIGH (ref 70–99)
Glucose-Capillary: 246 mg/dL — ABNORMAL HIGH (ref 70–99)
Glucose-Capillary: 252 mg/dL — ABNORMAL HIGH (ref 70–99)
Glucose-Capillary: 275 mg/dL — ABNORMAL HIGH (ref 70–99)

## 2020-03-15 LAB — CBC
HCT: 41.7 % (ref 36.0–46.0)
Hemoglobin: 12.5 g/dL (ref 12.0–15.0)
MCH: 25.9 pg — ABNORMAL LOW (ref 26.0–34.0)
MCHC: 30 g/dL (ref 30.0–36.0)
MCV: 86.5 fL (ref 80.0–100.0)
Platelets: 143 10*3/uL — ABNORMAL LOW (ref 150–400)
RBC: 4.82 MIL/uL (ref 3.87–5.11)
RDW: 14.9 % (ref 11.5–15.5)
WBC: 37 10*3/uL — ABNORMAL HIGH (ref 4.0–10.5)
nRBC: 0.3 % — ABNORMAL HIGH (ref 0.0–0.2)

## 2020-03-15 LAB — CALCIUM, IONIZED: Calcium, Ionized, Serum: 4.2 mg/dL — ABNORMAL LOW (ref 4.5–5.6)

## 2020-03-15 LAB — TRIGLYCERIDES: Triglycerides: 646 mg/dL — ABNORMAL HIGH (ref <150)

## 2020-03-15 LAB — C-REACTIVE PROTEIN: CRP: 1.1 mg/dL — ABNORMAL HIGH (ref <1.0)

## 2020-03-15 LAB — FIBRIN DERIVATIVES D-DIMER (ARMC ONLY): Fibrin derivatives D-dimer (ARMC): >7500 ng/mL (FEU) — ABNORMAL HIGH (ref 0.00–499.00)

## 2020-03-15 MED ORDER — ETOMIDATE 2 MG/ML IV SOLN
INTRAVENOUS | Status: AC
  Administered 2020-03-15: 2 mg via INTRAVENOUS
  Filled 2020-03-15: qty 10

## 2020-03-15 MED ORDER — ROCURONIUM BROMIDE 50 MG/5ML IV SOLN
INTRAVENOUS | Status: AC
  Filled 2020-03-15: qty 1

## 2020-03-15 MED ORDER — VECURONIUM BROMIDE 10 MG IV SOLR
INTRAVENOUS | Status: AC
  Filled 2020-03-15: qty 10

## 2020-03-15 MED ORDER — PROPOFOL 1000 MG/100ML IV EMUL
INTRAVENOUS | Status: AC
  Administered 2020-03-15: 10 ug/kg/min via INTRAVENOUS
  Filled 2020-03-15: qty 100

## 2020-03-15 MED ORDER — MIDAZOLAM HCL 50 MG/10ML IJ SOLN
0.5000 mg/h | INTRAVENOUS | Status: DC
  Administered 2020-03-15: 0.5 mg/h via INTRAVENOUS
  Administered 2020-03-16 (×2): 5 mg/h via INTRAVENOUS
  Filled 2020-03-15 (×3): qty 10

## 2020-03-15 MED ORDER — LORAZEPAM 2 MG/ML IJ SOLN
4.0000 mg | Freq: Once | INTRAMUSCULAR | Status: AC
  Administered 2020-03-15: 4 mg via INTRAVENOUS
  Filled 2020-03-15: qty 2

## 2020-03-15 MED ORDER — MIDAZOLAM HCL 2 MG/2ML IJ SOLN: 2.0000 mg | INTRAMUSCULAR | Status: DC | PRN

## 2020-03-15 MED ORDER — STERILE WATER FOR INJECTION IV SOLN
INTRAVENOUS | Status: DC
  Filled 2020-03-15 (×4): qty 850
  Filled 2020-03-15 (×2): qty 150

## 2020-03-15 MED ORDER — LACTATED RINGERS IV BOLUS
1000.0000 mL | Freq: Once | INTRAVENOUS | Status: AC
  Administered 2020-03-15: 1000 mL via INTRAVENOUS

## 2020-03-15 MED ORDER — FUROSEMIDE 10 MG/ML IJ SOLN: 20.0000 mg | Freq: Once | INTRAMUSCULAR | Status: DC

## 2020-03-15 MED ORDER — VECURONIUM BROMIDE 10 MG IV SOLR
10.0000 mg | INTRAVENOUS | Status: DC | PRN
  Administered 2020-03-15 – 2020-03-17 (×8): 10 mg via INTRAVENOUS
  Filled 2020-03-15 (×7): qty 10

## 2020-03-15 MED ORDER — SODIUM BICARBONATE 8.4 % IV SOLN
INTRAVENOUS | Status: AC
  Administered 2020-03-15: 50 meq via INTRAVENOUS
  Filled 2020-03-15: qty 50

## 2020-03-15 MED ORDER — SODIUM BICARBONATE 8.4 % IV SOLN: 50.0000 meq | Freq: Once | INTRAVENOUS | Status: AC

## 2020-03-15 MED ORDER — MIDAZOLAM 50MG/50ML (1MG/ML) PREMIX INFUSION
0.5000 mg/h | INTRAVENOUS | Status: DC
  Filled 2020-03-15: qty 50

## 2020-03-15 MED ORDER — FENTANYL CITRATE (PF) 100 MCG/2ML IJ SOLN
INTRAMUSCULAR | Status: AC
  Administered 2020-03-15: 50 ug via INTRAVENOUS
  Filled 2020-03-15: qty 2

## 2020-03-15 MED ORDER — SODIUM CHLORIDE 0.9 % IV SOLN
0.0000 ug/min | INTRAVENOUS | Status: DC
  Administered 2020-03-15: 250 ug/min via INTRAVENOUS
  Administered 2020-03-15: 15 ug/min via INTRAVENOUS
  Administered 2020-03-15: 30 ug/min via INTRAVENOUS
  Administered 2020-03-15: 80 ug/min via INTRAVENOUS
  Administered 2020-03-15: 20 ug/min via INTRAVENOUS
  Administered 2020-03-15: 110 ug/min via INTRAVENOUS
  Filled 2020-03-15 (×5): qty 10
  Filled 2020-03-15: qty 1

## 2020-03-15 MED ORDER — FAMOTIDINE IN NACL 20-0.9 MG/50ML-% IV SOLN
20.0000 mg | Freq: Two times a day (BID) | INTRAVENOUS | Status: DC
  Administered 2020-03-15 – 2020-03-16 (×4): 20 mg via INTRAVENOUS
  Filled 2020-03-15 (×4): qty 50

## 2020-03-15 MED ORDER — PROPOFOL 1000 MG/100ML IV EMUL
0.0000 ug/kg/min | INTRAVENOUS | Status: DC
  Administered 2020-03-15: 10 ug/kg/min via INTRAVENOUS
  Filled 2020-03-15: qty 100

## 2020-03-15 MED ORDER — FENTANYL 2500MCG IN NS 250ML (10MCG/ML) PREMIX INFUSION
INTRAVENOUS | Status: AC
  Administered 2020-03-15: 50 ug/h via INTRAVENOUS
  Filled 2020-03-15: qty 250

## 2020-03-15 MED ORDER — POLYETHYLENE GLYCOL 3350 17 G PO PACK
17.0000 g | PACK | Freq: Every day | ORAL | Status: DC
  Administered 2020-03-15 – 2020-03-16 (×2): 17 g via ORAL
  Filled 2020-03-15: qty 1

## 2020-03-15 MED ORDER — SODIUM CHLORIDE 0.9 % IV SOLN
0.5000 mg/h | INTRAVENOUS | Status: DC
  Filled 2020-03-15: qty 10

## 2020-03-15 MED ORDER — HYDROCORTISONE NA SUCCINATE PF 100 MG IJ SOLR
50.0000 mg | Freq: Four times a day (QID) | INTRAMUSCULAR | Status: DC
  Administered 2020-03-15 – 2020-03-17 (×10): 50 mg via INTRAVENOUS
  Filled 2020-03-15 (×10): qty 2

## 2020-03-15 MED ORDER — VASOPRESSIN 20 UNITS/100 ML INFUSION FOR SHOCK
0.0000 [IU]/min | INTRAVENOUS | Status: DC
  Administered 2020-03-15: 0.03 [IU]/min via INTRAVENOUS
  Administered 2020-03-15: 0.04 [IU]/min via INTRAVENOUS
  Administered 2020-03-15: 0.03 [IU]/min via INTRAVENOUS
  Administered 2020-03-16 – 2020-03-17 (×5): 0.04 [IU]/min via INTRAVENOUS
  Filled 2020-03-15 (×9): qty 100

## 2020-03-15 MED ORDER — FENTANYL 2500MCG IN NS 250ML (10MCG/ML) PREMIX INFUSION
50.0000 ug/h | INTRAVENOUS | Status: DC
  Administered 2020-03-15 – 2020-03-16 (×2): 200 ug/h via INTRAVENOUS
  Filled 2020-03-15 (×2): qty 250

## 2020-03-15 MED ORDER — VECURONIUM BROMIDE 10 MG IV SOLR
10.0000 mg | Freq: Once | INTRAVENOUS | Status: AC
  Administered 2020-03-15: 10 mg via INTRAVENOUS
  Filled 2020-03-15: qty 10

## 2020-03-15 MED ORDER — FENTANYL BOLUS VIA INFUSION
50.0000 ug | INTRAVENOUS | Status: DC | PRN
  Administered 2020-03-15 (×2): 50 ug via INTRAVENOUS
  Filled 2020-03-15: qty 50

## 2020-03-15 MED ORDER — ORAL CARE MOUTH RINSE
15.0000 mL | OROMUCOSAL | Status: DC
  Administered 2020-03-15 – 2020-03-17 (×25): 15 mL via OROMUCOSAL

## 2020-03-15 MED ORDER — NOREPINEPHRINE 16 MG/250ML-% IV SOLN
0.0000 ug/min | INTRAVENOUS | Status: DC
  Administered 2020-03-15: 2 ug/min via INTRAVENOUS
  Administered 2020-03-15 – 2020-03-16 (×5): 40 ug/min via INTRAVENOUS
  Administered 2020-03-16: 20 ug/min via INTRAVENOUS
  Administered 2020-03-17 (×3): 200 ug/min via INTRAVENOUS
  Administered 2020-03-17: 40 ug/min via INTRAVENOUS
  Filled 2020-03-15 (×14): qty 250

## 2020-03-15 MED ORDER — CHLORHEXIDINE GLUCONATE 0.12% ORAL RINSE (MEDLINE KIT)
15.0000 mL | Freq: Two times a day (BID) | OROMUCOSAL | Status: DC
  Administered 2020-03-15 – 2020-03-17 (×5): 15 mL via OROMUCOSAL

## 2020-03-15 MED ORDER — FENTANYL CITRATE (PF) 100 MCG/2ML IJ SOLN: 50.0000 ug | Freq: Once | INTRAMUSCULAR | Status: AC

## 2020-03-15 MED ORDER — DOCUSATE SODIUM 50 MG/5ML PO LIQD
100.0000 mg | Freq: Two times a day (BID) | ORAL | Status: DC
  Administered 2020-03-15 – 2020-03-16 (×3): 100 mg via ORAL
  Filled 2020-03-15 (×2): qty 10

## 2020-03-15 NOTE — Progress Notes (Signed)
CRITICAL CARE NOTE Admitted for Severe resp failure,On biPAP, COVID 19 pneumonia 8/17 intubated for severe hypoxia  CC  follow up respiratory failure  SUBJECTIVE Patient remains critically ill Prognosis is guarded severe ARDS   BP (!) 70/26   Pulse (!) 133   Temp 99.5 F (37.5 C) (Esophageal)   Resp (!) 30   Ht 5\' 1"  (1.549 m)   Wt 100 kg   LMP 03/01/2020   SpO2 (!) 89%   BMI 41.66 kg/m    I/O last 3 completed shifts: In: 790.2 [I.V.:152.6; IV Piggyback:637.6] Out: 2065 [Urine:2065] No intake/output data recorded.  SpO2: (!) 89 % O2 Flow Rate (L/min): 50 L/min FiO2 (%): 100 %  Estimated body mass index is 41.66 kg/m as calculated from the following:   Height as of this encounter: 5\' 1"  (1.549 m).   Weight as of this encounter: 100 kg.  SIGNIFICANT EVENTS   REVIEW OF SYSTEMS  PATIENT IS UNABLE TO PROVIDE COMPLETE REVIEW OF SYSTEMS DUE TO SEVERE CRITICAL ILLNESS        PHYSICAL EXAMINATION:  GENERAL:critically ill appearing, +resp distress HEAD: Normocephalic, atraumatic.  EYES: Pupils equal, round, reactive to light.  No scleral icterus.  MOUTH: Moist mucosal membrane. NECK: Supple.  PULMONARY: +rhonchi, +wheezing CARDIOVASCULAR: S1 and S2. Regular rate and rhythm. No murmurs, rubs, or gallops.  GASTROINTESTINAL: Soft, nontender, -distended.  Positive bowel sounds.   MUSCULOSKELETAL: No swelling, clubbing, or edema.  NEUROLOGIC: obtunded, GCS<8 SKIN:intact,warm,dry  MEDICATIONS: I have reviewed all medications and confirmed regimen as documented   CULTURE RESULTS   Recent Results (from the past 240 hour(s))  SARS Coronavirus 2 by RT PCR (hospital order, performed in [facility] hospital lab) Nasopharyngeal Nasopharyngeal Swab     Status: Abnormal   Collection Time: 03/03/2020  3:30 PM   Specimen: Nasopharyngeal Swab  Result Value Ref Range Status   SARS Coronavirus 2 POSITIVE (A) NEGATIVE Final    Comment: RESULT CALLED TO, READ BACK BY AND  VERIFIED WITH: JENNIFER WHITLEY RN AT 0175 ON 03/23/2020 SNG (NOTE) SARS-CoV-2 target nucleic acids are DETECTED  SARS-CoV-2 RNA is generally detectable in upper respiratory specimens  during the acute phase of infection.  Positive results are indicative  of the presence of the identified virus, but do not rule out bacterial infection or co-infection with other pathogens not detected by the test.  Clinical correlation with patient history and  other diagnostic information is necessary to determine patient infection status.  The expected result is negative.  Fact Sheet for Patients:   StrictlyIdeas.no   Fact Sheet for Healthcare Providers:   BankingDealers.co.za    This test is not yet approved or cleared by the [place] FDA and  has been authorized for detection and/or diagnosis of SARS-CoV-2 by FDA under an Emergency Use Authorization (EUA).  This EUA will remain in effect (meaning  this test can be used) for the duration of  the COVID-19 declaration under Section 564(b)(1) of the Act, 21 U.S.C. section 360-bbb-3(b)(1), unless the authorization is terminated or revoked sooner.  Performed at [facility], [address] [place], [place] [address]   Blood culture (routine x 2)     Status: None   Collection Time: 03/10/2020  3:30 PM   Specimen: BLOOD  Result Value Ref Range Status   Specimen Description BLOOD BLOOD RIGHT ARM  Final   Special Requests   Final    BOTTLES DRAWN AEROBIC AND ANAEROBIC Blood Culture adequate volume   Culture   Final  NO GROWTH 5 DAYS Performed at [facility], [address] [place], [place] [address]    Report Status 03/12/2020 FINAL  Final  Blood culture (routine x 2)     Status: None   Collection Time: 03/03/2020  3:30 PM   Specimen: BLOOD  Result Value Ref Range Status   Specimen Description BLOOD BLOOD LEFT ARM  Final   Special Requests   Final    BOTTLES DRAWN AEROBIC AND  ANAEROBIC Blood Culture adequate volume   Culture   Final    NO GROWTH 5 DAYS Performed at [facility], [address] [place], [place] [address]    Report Status 03/12/2020 FINAL  Final  MRSA PCR Screening     Status: None   Collection Time: 03/08/20  9:55 PM   Specimen: Nasal Mucosa; Nasopharyngeal  Result Value Ref Range Status   MRSA by PCR NEGATIVE NEGATIVE Final    Comment:        The GeneXpert MRSA Assay (FDA approved for NASAL specimens only), is one component of a comprehensive MRSA colonization surveillance program. It is not intended to diagnose MRSA infection nor to guide or monitor treatment for MRSA infections. Performed at [facility], [place], [place] [address]           IMAGING    DG Abd 1 View  Result Date: 03/15/2020 CLINICAL DATA:  Orogastric tube placement EXAM: ABDOMEN - 1 VIEW COMPARISON:  None. FINDINGS: Orogastric tube tip and side port project over the gas distended stomach. IMPRESSION: OG tube tip and side port project within the stomach. Electronically Signed   By: Ulyses Jarred M.D.   On: 03/15/2020 02:52   Portable Chest x-ray  Result Date: 03/15/2020 CLINICAL DATA:  Intubation EXAM: PORTABLE CHEST 1 VIEW COMPARISON:  None. FINDINGS: Support Apparatus: --Endotracheal tube: Tip just below the level of the clavicular heads. --Enteric tube:Tip and sideport are below the field of view. --Catheter(s):None --Other: None Cardiomediastinal contours are normal. There is moderate interstitial opacity, right greater than left. IMPRESSION: 1. Endotracheal tube tip just below the level of the clavicular heads. 2. Right greater than left interstitial opacities, which may indicate pulmonary edema or infection. Electronically Signed   By: Ulyses Jarred M.D.   On: 03/15/2020 02:51   [place] EKG SITE RITE  Result Date: 03/14/2020 If Site Rite image not attached, placement could not be confirmed due to current cardiac  rhythm.    Nutrition Status: Nutrition Problem: Increased nutrient needs Etiology: catabolic illness (COVID 19) Signs/Symptoms: estimated needs       Indwelling Urinary Catheter continued, requirement due to   Reason to continue Indwelling Urinary Catheter strict Intake/Output monitoring for hemodynamic instability   Central Line/ continued, requirement due to  Reason to continue [place] of central venous pressure or other hemodynamic parameters and poor IV access   Ventilator continued, requirement due to severe respiratory failure   Ventilator Sedation RASS 0 to -2      ASSESSMENT AND PLAN SYNOPSIS   Acute hypoxemic respiratory failure due to COVID-19 pneumonia / ARDS Mechanical ventilation via ARDS protocol, target PRVC 6 cc/kg Wean PEEP and FiO2 as able Goal plateau pressure less than 30, driving pressure less than 15 Paralytics if necessary for vent synchrony, gas exchange Cycle prone positioning if necessary for oxygenation Deep sedation per PAD protocol, goal RASS -4, currently fentanyl, midazolam Diuresis as blood pressure and renal function can tolerate, goal CVP 5-8.   diuresis as tolerated  based on Kidney function VAP prevention order set Remdesivir, completed for 5 days Dexamethasone, plan for 10 days Vitamin C, zinc Plan to repeat and check resp cultures    Severe ACUTE Hypoxic and Hypercapnic Respiratory Failure -continue Full MV support -continue Bronchodilator Therapy -Wean Fio2 and PEEP as tolerated -VAP/VENT bundle implementation   Morbid obesity, possible OSA.   Will certainly impact respiratory mechanics, ventilator weaning   ACUTE KIDNEY INJURY/Renal Failure -continue Foley Catheter-assess need -Avoid nephrotoxic agents -Follow urine output, BMP -Ensure adequate renal perfusion, optimize oxygenation -Renal dose medications     NEUROLOGY Acute toxic metabolic encephalopathy, need for sedation Goal RASS -2 to  -3  SHOCK-SEPSIS/HYPOVOLUMIC/CARDIOGENIC -use vasopressors to keep MAP>65 -follow ABG and LA -follow up cultures -emperic ABX -consider stress dose steroids   CARDIAC ICU monitoring  ID -continue IV abx as prescibed -follow up cultures  GI GI PROPHYLAXIS as indicated  NUTRITIONAL STATUS Nutrition Status: Nutrition Problem: Increased nutrient needs Etiology: catabolic illness (COVID 19) Signs/Symptoms: estimated needs     DIET-->TF's as tolerated Constipation protocol as indicated  ENDO - will use ICU hypoglycemic\Hyperglycemia protocol if indicated     ELECTROLYTES -follow labs as needed -replace as needed -pharmacy consultation and following   DVT/GI PRX ordered and assessed TRANSFUSIONS AS NEEDED MONITOR FSBS I Assessed the need for Labs I Assessed the need for Foley I Assessed the need for Central Venous Line Family Discussion when available I Assessed the need for Mobilization I made an Assessment of medications to be adjusted accordingly Safety Risk assessment completed   CASE DISCUSSED IN MULTIDISCIPLINARY ROUNDS WITH ICU TEAM  Critical Care Time devoted to patient care services described in this note is 45 minutes.   Overall, patient is critically ill, prognosis is guarded.  Patient with Multiorgan failure and at high risk for cardiac arrest and death.    Corrin Parker, M.D.  [facility]  Medical Director [place] Director [facility] Cardio-Pulmonary Department

## 2020-03-15 NOTE — Procedures (Addendum)
Intubation Procedure Note  Kathleen Espinoza  [id number]  [birth year]  Date:03/15/20  Time:2:09 AM   Provider Performing:Jesyka Slaght D [name]    Procedure: Intubation (21975)  Indication(s) Respiratory Failure  Consent Unable to obtain consent due to emergent nature of procedure.   Anesthesia Etomidate, Fentanyl and Rocuronium   Time Out Verified patient identification, verified procedure, site/side was marked, verified correct patient position, special equipment/implants available, medications/allergies/relevant history reviewed, required imaging and test results available.   Sterile Technique Usual hand hygeine, masks, and gloves were used   Procedure Description Patient positioned in bed supine.  Sedation given as noted above.  Patient was intubated with endotracheal tube using Glidescope.  View was Grade 1 full glottis .  Number of attempts was 1.  Colorimetric CO2 detector was consistent with tracheal placement.    Heart rate, SpO2 and blood pressure was continuously monitored during the procedure. Pre-oxygenation was conducted prior to intubation and endotracheal tube was placed through the vocal cords into the trachea.   The artificial airway was placed under direct visualization via glidescope route using a 7.5 ETT on the first attempt.   ETT was secured at 23 cm at liip.   Placement was confirmed by auscuitation of lungs with good breath sounds bilaterally and no stomach sounds.  Condensation was noted on endotracheal tube.   Pulse ox 52. CO2 detector in place with appropriate color change.   Complications/Tolerance None; patient tolerated the procedure well. Chest X-ray is ordered to verify placement.   EBL Minimal   Specimen(s) None            Darel Hong, AGACNP-BC  Pulmonary & Critical Care Medicine Pager: [phone]

## 2020-03-15 NOTE — Progress Notes (Signed)
Nutrition Follow-up  DOCUMENTATION CODES:   Morbid obesity  INTERVENTION:   Once stable for tube feeds, recommend:  Vital HP @60ml /hr- Initiate at 37ml/hr and increase by 70ml/hr q 8 hours until goal rate is reached.   Free water flushes 26ml q4 hours to maintain tube patency   Regimen provides 1440kcal/day, 126g/day protein and 1345ml/day free water   Pt at high refeed risk; recommend monitor K, Mg and P labs daily after tube feed initiation   NUTRITION DIAGNOSIS:   Increased nutrient needs related to catabolic illness (COVID 19) as evidenced by estimated needs. Ongoing.  GOAL:   Patient will meet greater than or equal to 90% of their needs -progressing with initiation of tube feeds   MONITOR:   Vent status, Labs, Weight trends, TF tolerance, Skin, I & O's  ASSESSMENT:   49 y.o. female with a past medical history of hypertension and hyperlipidemia who is admitted with COVID 19   Pt now sedated and intubated r/t hypoxia. OGT in place. Pt on multiple pressors and is not stable for tube feeds at this time. Pt is at high refeed risk. Pt currently ~16lbs below her UBW; pt does appear to have lost weight in the hospital.   Medications reviewed and include: vitamin C, colace, lovenox, insulin, melatonin, MVI, protonix, miralax, zinc, azithromycin, ceftriaxone, pepcid, fentanyl, levophed, neo-synephrine, vasopressin   Labs reviewed: BUN 21(H), P 3.2 wnl, Mg 2.3 wnl Wbc- 11.3(H) cbgs- 129, 275, 246 x 24 hrs  Patient is currently intubated on ventilator support MV: 11.5 L/min Temp (24hrs), Avg:98.9 F (37.2 C), Min:97.7 F (36.5 C), Max:100 F (37.8 C)  Propofol: none   MAP- 34-76mmHg   UOP- 1876ml   Diet Order:   Diet Order            Diet NPO time specified  Diet effective now                EDUCATION NEEDS:   No education needs have been identified at this time  Skin:  Skin Assessment: Reviewed RN Assessment  Last BM:  Unknown  Height:   Ht  Readings from Last 1 Encounters:  03/09/2020 5\' 1"  (1.549 m)   Weight:   Wt Readings from Last 1 Encounters:  03/15/20 100 kg   Ideal Body Weight:  47.7 kg  BMI:  Body mass index is 41.66 kg/m.  Estimated Nutritional Needs:   Kcal:  1372kcal/kg (20kcal/kg of AdBW)  Protein:  120g/day  Fluid:  1.7L/day  Koleen Distance MS, RD, LDN Please refer to [facility] for RD and/or RD on-call/weekend/after hours pager

## 2020-03-15 NOTE — Progress Notes (Signed)
Inpatient Diabetes Program Recommendations  AACE/ADA: New Consensus Statement on Inpatient Glycemic Control (2015)  Target Ranges:  Prepandial:   less than 140 mg/dL      Peak postprandial:   less than 180 mg/dL (1-2 hours)      Critically ill patients:  140 - 180 mg/dL   Lab Results  Component Value Date   GLUCAP 246 (H) 03/15/2020   HGBA1C 7.3 (H) 03/06/2020    Review of Glycemic Control Results for Kathleen Espinoza, Kathleen Espinoza (MRN [id number]) as of 03/15/2020 08:25  Ref. Range 03/15/2020 00:14 03/15/2020 04:03 03/15/2020 07:34  Glucose-Capillary Latest Ref Range: 70 - 99 mg/dL 129 (H) 275 (H) 246 (H)   Inpatient Diabetes Program Recommendations:   If blood sugars continue>200 mg/dL, consider adding IV insulin.   Thanks  Adah Perl, RN, BC-ADM Inpatient Diabetes Coordinator Pager [phone] (8a-5p)

## 2020-03-15 NOTE — Procedures (Signed)
Arterial Catheter Insertion Procedure Note  MADASON RAULS  [id number]  1971-04-14  Date:03/15/20  Time:9:39 PM    Provider Performing: Awilda Bill    Procedure: Insertion of Arterial Line [phone]) with US guidance (70929)   Indication(s) Blood pressure monitoring and/or need for frequent ABGs  Consent Unable to obtain consent due to emergent nature of procedure.  Anesthesia None   Time Out Verified patient identification, verified procedure, site/side was marked, verified correct patient position, special equipment/implants available, medications/allergies/relevant history reviewed, required imaging and test results available.   Sterile Technique Maximal sterile technique including full sterile barrier drape, hand hygiene, sterile gown, sterile gloves, mask, hair covering, sterile ultrasound probe cover (if used).   Procedure Description Area of catheter insertion was cleaned with chlorhexidine and draped in sterile fashion. With real-time ultrasound guidance an arterial catheter was placed into the right femoral artery.  Appropriate arterial tracings confirmed on monitor.     Complications/Tolerance None; patient tolerated the procedure well.   EBL Minimal   Specimen(s) None  Pt severely hypotensive requiring 3 vasopressors, therefore emergent right femoral arterial line placed utilizing ultrasound no complications noted during or following procedure.  Marda Stalker, [place] Pager [phone] (please enter 7 digits) PCCM Consult Pager [phone] (please enter 7 digits)

## 2020-03-15 NOTE — Progress Notes (Signed)
Was called to bedside by nursing at around 02:00 as pt developed acute respiratory distress and severe hypoxia with O2 sats in the 50's.  Pt was emergently intubated.  Post intubation O2 sats have improved to mid 90's.  Have called and updated pt's husband  Migdalia Olejniczak of her decompensation requiring emergent intubation.  All questions answered.     Darel Hong, AGACNP-BC [facility] Pager: [phone]

## 2020-03-15 NOTE — Progress Notes (Signed)
*  PRELIMINARY RESULTS* Echocardiogram 2D Echocardiogram has been performed.  Sherrie Sport 03/15/2020, 9:59 AM

## 2020-03-16 ENCOUNTER — Inpatient Hospital Stay: Payer: Managed Care, Other (non HMO)

## 2020-03-16 DIAGNOSIS — J1282 Pneumonia due to Coronavirus disease 2019: Secondary | ICD-10-CM | POA: Diagnosis not present

## 2020-03-16 DIAGNOSIS — J80 Acute respiratory distress syndrome: Secondary | ICD-10-CM | POA: Diagnosis not present

## 2020-03-16 DIAGNOSIS — J9600 Acute respiratory failure, unspecified whether with hypoxia or hypercapnia: Secondary | ICD-10-CM

## 2020-03-16 DIAGNOSIS — J9601 Acute respiratory failure with hypoxia: Secondary | ICD-10-CM | POA: Diagnosis not present

## 2020-03-16 DIAGNOSIS — U071 COVID-19: Secondary | ICD-10-CM | POA: Diagnosis not present

## 2020-03-16 LAB — CBC
HCT: 36.4 % (ref 36.0–46.0)
Hemoglobin: 11.1 g/dL — ABNORMAL LOW (ref 12.0–15.0)
MCH: 26 pg (ref 26.0–34.0)
MCHC: 30.5 g/dL (ref 30.0–36.0)
MCV: 85.2 fL (ref 80.0–100.0)
Platelets: 82 10*3/uL — ABNORMAL LOW (ref 150–400)
RBC: 4.27 MIL/uL (ref 3.87–5.11)
RDW: 15 % (ref 11.5–15.5)
WBC: 33.7 10*3/uL — ABNORMAL HIGH (ref 4.0–10.5)
nRBC: 0.4 % — ABNORMAL HIGH (ref 0.0–0.2)

## 2020-03-16 LAB — CBC WITH DIFFERENTIAL/PLATELET
Abs Immature Granulocytes: 1.41 10*3/uL — ABNORMAL HIGH (ref 0.00–0.07)
Basophils Absolute: 0.1 10*3/uL (ref 0.0–0.1)
Basophils Relative: 0 %
Eosinophils Absolute: 0 10*3/uL (ref 0.0–0.5)
Eosinophils Relative: 0 %
HCT: 38.6 % (ref 36.0–46.0)
Hemoglobin: 11.5 g/dL — ABNORMAL LOW (ref 12.0–15.0)
Immature Granulocytes: 4 %
Lymphocytes Relative: 4 %
Lymphs Abs: 1.2 10*3/uL (ref 0.7–4.0)
MCH: 25.7 pg — ABNORMAL LOW (ref 26.0–34.0)
MCHC: 29.8 g/dL — ABNORMAL LOW (ref 30.0–36.0)
MCV: 86.2 fL (ref 80.0–100.0)
Monocytes Absolute: 2.8 10*3/uL — ABNORMAL HIGH (ref 0.1–1.0)
Monocytes Relative: 8 %
Neutro Abs: 29.5 10*3/uL — ABNORMAL HIGH (ref 1.7–7.7)
Neutrophils Relative %: 84 %
Platelets: 80 10*3/uL — ABNORMAL LOW (ref 150–400)
RBC: 4.48 MIL/uL (ref 3.87–5.11)
RDW: 15.1 % (ref 11.5–15.5)
Smear Review: DECREASED
WBC: 35.1 10*3/uL — ABNORMAL HIGH (ref 4.0–10.5)
nRBC: 0.5 % — ABNORMAL HIGH (ref 0.0–0.2)

## 2020-03-16 LAB — BASIC METABOLIC PANEL
Anion gap: 16 — ABNORMAL HIGH (ref 5–15)
BUN: 51 mg/dL — ABNORMAL HIGH (ref 6–20)
CO2: 22 mmol/L (ref 22–32)
Calcium: 7.3 mg/dL — ABNORMAL LOW (ref 8.9–10.3)
Chloride: 107 mmol/L (ref 98–111)
Creatinine, Ser: 4.47 mg/dL — ABNORMAL HIGH (ref 0.44–1.00)
GFR calc Af Amer: 13 mL/min — ABNORMAL LOW (ref >60)
GFR calc non Af Amer: 11 mL/min — ABNORMAL LOW (ref >60)
Glucose, Bld: 215 mg/dL — ABNORMAL HIGH (ref 70–99)
Potassium: 5.3 mmol/L — ABNORMAL HIGH (ref 3.5–5.1)
Sodium: 145 mmol/L (ref 135–145)

## 2020-03-16 LAB — FIBRIN DERIVATIVES D-DIMER (ARMC ONLY): Fibrin derivatives D-dimer (ARMC): >10000 ng/mL (FEU) — ABNORMAL HIGH (ref 0.00–499.00)

## 2020-03-16 LAB — ECHOCARDIOGRAM COMPLETE
Height: 61 in
S' Lateral: 2.21 cm
Weight: 3527.36 oz

## 2020-03-16 LAB — RENAL FUNCTION PANEL
Albumin: 2.5 g/dL — ABNORMAL LOW (ref 3.5–5.0)
Anion gap: 17 — ABNORMAL HIGH (ref 5–15)
BUN: 52 mg/dL — ABNORMAL HIGH (ref 6–20)
CO2: 21 mmol/L — ABNORMAL LOW (ref 22–32)
Calcium: 7.1 mg/dL — ABNORMAL LOW (ref 8.9–10.3)
Chloride: 106 mmol/L (ref 98–111)
Creatinine, Ser: 4.35 mg/dL — ABNORMAL HIGH (ref 0.44–1.00)
GFR calc Af Amer: 13 mL/min — ABNORMAL LOW (ref >60)
GFR calc non Af Amer: 11 mL/min — ABNORMAL LOW (ref >60)
Glucose, Bld: 246 mg/dL — ABNORMAL HIGH (ref 70–99)
Phosphorus: 9.6 mg/dL — ABNORMAL HIGH (ref 2.5–4.6)
Potassium: 5.5 mmol/L — ABNORMAL HIGH (ref 3.5–5.1)
Sodium: 144 mmol/L (ref 135–145)

## 2020-03-16 LAB — COMPREHENSIVE METABOLIC PANEL
ALT: 292 U/L — ABNORMAL HIGH (ref 0–44)
ALT: 431 U/L — ABNORMAL HIGH (ref 0–44)
AST: 462 U/L — ABNORMAL HIGH (ref 15–41)
AST: 644 U/L — ABNORMAL HIGH (ref 15–41)
Albumin: 2.4 g/dL — ABNORMAL LOW (ref 3.5–5.0)
Albumin: 2.6 g/dL — ABNORMAL LOW (ref 3.5–5.0)
Alkaline Phosphatase: 150 U/L — ABNORMAL HIGH (ref 38–126)
Alkaline Phosphatase: 149 U/L — ABNORMAL HIGH (ref 38–126)
Anion gap: 13 (ref 5–15)
Anion gap: 16 — ABNORMAL HIGH (ref 5–15)
BUN: 44 mg/dL — ABNORMAL HIGH (ref 6–20)
BUN: 46 mg/dL — ABNORMAL HIGH (ref 6–20)
CO2: 21 mmol/L — ABNORMAL LOW (ref 22–32)
CO2: 21 mmol/L — ABNORMAL LOW (ref 22–32)
Calcium: 6.4 mg/dL — CL (ref 8.9–10.3)
Calcium: 7 mg/dL — ABNORMAL LOW (ref 8.9–10.3)
Chloride: 112 mmol/L — ABNORMAL HIGH (ref 98–111)
Chloride: 110 mmol/L (ref 98–111)
Creatinine, Ser: 3.27 mg/dL — ABNORMAL HIGH (ref 0.44–1.00)
Creatinine, Ser: 3.67 mg/dL — ABNORMAL HIGH (ref 0.44–1.00)
GFR calc Af Amer: 18 mL/min — ABNORMAL LOW (ref >60)
GFR calc Af Amer: 16 mL/min — ABNORMAL LOW (ref >60)
GFR calc non Af Amer: 16 mL/min — ABNORMAL LOW (ref >60)
GFR calc non Af Amer: 14 mL/min — ABNORMAL LOW (ref >60)
Glucose, Bld: 214 mg/dL — ABNORMAL HIGH (ref 70–99)
Glucose, Bld: 214 mg/dL — ABNORMAL HIGH (ref 70–99)
Potassium: 5.6 mmol/L — ABNORMAL HIGH (ref 3.5–5.1)
Potassium: 5.8 mmol/L — ABNORMAL HIGH (ref 3.5–5.1)
Sodium: 146 mmol/L — ABNORMAL HIGH (ref 135–145)
Sodium: 147 mmol/L — ABNORMAL HIGH (ref 135–145)
Total Bilirubin: 0.8 mg/dL (ref 0.3–1.2)
Total Bilirubin: 0.7 mg/dL (ref 0.3–1.2)
Total Protein: 5 g/dL — ABNORMAL LOW (ref 6.5–8.1)
Total Protein: 5.3 g/dL — ABNORMAL LOW (ref 6.5–8.1)

## 2020-03-16 LAB — BLOOD GAS, ARTERIAL
Acid-base deficit: 9 mmol/L — ABNORMAL HIGH (ref 0.0–2.0)
Acid-base deficit: 7.6 mmol/L — ABNORMAL HIGH (ref 0.0–2.0)
Bicarbonate: 20.4 mmol/L (ref 20.0–28.0)
Bicarbonate: 22.1 mmol/L (ref 20.0–28.0)
FIO2: 1
FIO2: 1
MECHVT: 480 mL
MECHVT: 480 mL
Mechanical Rate: 30
O2 Saturation: 91.8 %
O2 Saturation: 93.8 %
PEEP: 12 cmH2O
PEEP: 12 cmH2O
Patient temperature: 38.4
Patient temperature: 37
RATE: 30 resp/min
pCO2 arterial: 64 mmHg — ABNORMAL HIGH (ref 32.0–48.0)
pCO2 arterial: 65 mmHg — ABNORMAL HIGH (ref 32.0–48.0)
pH, Arterial: 7.12 — CL (ref 7.350–7.450)
pH, Arterial: 7.14 — CL (ref 7.350–7.450)
pO2, Arterial: 90 mmHg (ref 83.0–108.0)
pO2, Arterial: 90 mmHg (ref 83.0–108.0)

## 2020-03-16 LAB — APTT: aPTT: 136 seconds — ABNORMAL HIGH (ref 24–36)

## 2020-03-16 LAB — TRIGLYCERIDES: Triglycerides: 523 mg/dL — ABNORMAL HIGH (ref <150)

## 2020-03-16 LAB — PROCALCITONIN: Procalcitonin: 7.95 ng/mL

## 2020-03-16 LAB — GLUCOSE, CAPILLARY
Glucose-Capillary: 183 mg/dL — ABNORMAL HIGH (ref 70–99)
Glucose-Capillary: 203 mg/dL — ABNORMAL HIGH (ref 70–99)
Glucose-Capillary: 207 mg/dL — ABNORMAL HIGH (ref 70–99)
Glucose-Capillary: 212 mg/dL — ABNORMAL HIGH (ref 70–99)
Glucose-Capillary: 142 mg/dL — ABNORMAL HIGH (ref 70–99)
Glucose-Capillary: 69 mg/dL — ABNORMAL LOW (ref 70–99)

## 2020-03-16 LAB — C-REACTIVE PROTEIN: CRP: 0.7 mg/dL (ref <1.0)

## 2020-03-16 MED ORDER — ASCORBIC ACID 500 MG PO TABS
500.0000 mg | ORAL_TABLET | Freq: Every day | ORAL | Status: DC
  Administered 2020-03-17: 500 mg
  Filled 2020-03-16: qty 1

## 2020-03-16 MED ORDER — SODIUM CHLORIDE 0.9 % IV SOLN
0.0000 ug/kg/min | INTRAVENOUS | Status: DC
  Filled 2020-03-16: qty 20

## 2020-03-16 MED ORDER — LORATADINE 10 MG PO TABS: 10.0000 mg | ORAL_TABLET | Freq: Every day | ORAL | Status: DC | PRN

## 2020-03-16 MED ORDER — FENTANYL CITRATE (PF) 100 MCG/2ML IJ SOLN: 50.0000 ug | Freq: Once | INTRAMUSCULAR | Status: DC

## 2020-03-16 MED ORDER — PRISMASOL BGK 0/2.5 32-2.5 MEQ/L IV SOLN
INTRAVENOUS | Status: DC
  Filled 2020-03-16: qty 5000

## 2020-03-16 MED ORDER — STERILE WATER FOR INJECTION IJ SOLN
INTRAMUSCULAR | Status: AC
  Administered 2020-03-16: 10 mL
  Filled 2020-03-16: qty 10

## 2020-03-16 MED ORDER — DEXTROSE 50 % IV SOLN
INTRAVENOUS | Status: AC
  Administered 2020-03-16: 12.5 g via INTRAVENOUS
  Filled 2020-03-16: qty 50

## 2020-03-16 MED ORDER — HYDROCOD POLST-CPM POLST ER 10-8 MG/5ML PO SUER: 5.0000 mL | Freq: Two times a day (BID) | ORAL | Status: DC | PRN

## 2020-03-16 MED ORDER — DEXTROSE 50 % IV SOLN
12.5000 g | Freq: Once | INTRAVENOUS | Status: AC
  Administered 2020-03-16: 12.5 g via INTRAVENOUS
  Filled 2020-03-16: qty 50

## 2020-03-16 MED ORDER — ZINC SULFATE 220 (50 ZN) MG PO CAPS
220.0000 mg | ORAL_CAPSULE | Freq: Every day | ORAL | Status: DC
  Administered 2020-03-17: 220 mg
  Filled 2020-03-16: qty 1

## 2020-03-16 MED ORDER — MELATONIN 5 MG PO TABS: 2.5000 mg | ORAL_TABLET | Freq: Every day | ORAL | Status: DC

## 2020-03-16 MED ORDER — ARGATROBAN 50 MG/50ML IV SOLN
0.3500 ug/kg/min | INTRAVENOUS | Status: DC
  Administered 2020-03-16: 0.5 ug/kg/min via INTRAVENOUS
  Filled 2020-03-16: qty 50

## 2020-03-16 MED ORDER — POLYETHYLENE GLYCOL 3350 17 G PO PACK
17.0000 g | PACK | Freq: Every day | ORAL | Status: DC
  Administered 2020-03-17: 17 g
  Filled 2020-03-16: qty 1

## 2020-03-16 MED ORDER — MIDAZOLAM 50MG/50ML (1MG/ML) PREMIX INFUSION
2.0000 mg/h | INTRAVENOUS | Status: DC
  Administered 2020-03-17: 5 mg/h via INTRAVENOUS
  Filled 2020-03-16: qty 50

## 2020-03-16 MED ORDER — FENTANYL 2500MCG IN NS 250ML (10MCG/ML) PREMIX INFUSION
50.0000 ug/h | INTRAVENOUS | Status: DC
  Administered 2020-03-16 – 2020-03-17 (×3): 200 ug/h via INTRAVENOUS
  Filled 2020-03-16 (×2): qty 250

## 2020-03-16 MED ORDER — ADULT MULTIVITAMIN W/MINERALS CH
1.0000 | ORAL_TABLET | Freq: Every day | ORAL | Status: DC
  Administered 2020-03-17: 1
  Filled 2020-03-16: qty 1

## 2020-03-16 MED ORDER — HEPARIN SODIUM (PORCINE) 1000 UNIT/ML DIALYSIS
1000.0000 [IU] | INTRAMUSCULAR | Status: DC | PRN
  Filled 2020-03-16: qty 6

## 2020-03-16 MED ORDER — MIDAZOLAM 50MG/50ML (1MG/ML) PREMIX INFUSION
2.0000 mg/h | INTRAVENOUS | Status: DC
  Administered 2020-03-16 (×2): 5 mg/h via INTRAVENOUS
  Filled 2020-03-16 (×2): qty 50

## 2020-03-16 MED ORDER — STERILE WATER FOR INJECTION IJ SOLN
INTRAMUSCULAR | Status: AC
  Filled 2020-03-16: qty 10

## 2020-03-16 MED ORDER — ARTIFICIAL TEARS OPHTHALMIC OINT
1.0000 "application " | TOPICAL_OINTMENT | Freq: Three times a day (TID) | OPHTHALMIC | Status: DC
  Administered 2020-03-17: 1 via OPHTHALMIC
  Filled 2020-03-16: qty 1

## 2020-03-16 MED ORDER — ENOXAPARIN SODIUM 30 MG/0.3ML ~~LOC~~ SOLN: 30.0000 mg | SUBCUTANEOUS | Status: DC

## 2020-03-16 MED ORDER — MIDAZOLAM BOLUS VIA INFUSION
1.0000 mg | INTRAVENOUS | Status: DC | PRN
  Filled 2020-03-16: qty 2

## 2020-03-16 MED ORDER — GUAIFENESIN-DM 100-10 MG/5ML PO SYRP: 10.0000 mL | ORAL_SOLUTION | ORAL | Status: DC | PRN

## 2020-03-16 MED ORDER — ALTEPLASE 2 MG IJ SOLR
2.0000 mg | Freq: Once | INTRAMUSCULAR | Status: DC
  Filled 2020-03-16: qty 2

## 2020-03-16 MED ORDER — SODIUM CHLORIDE 0.9 % FOR CRRT
INTRAVENOUS_CENTRAL | Status: DC | PRN
  Filled 2020-03-16 (×3): qty 1000

## 2020-03-16 MED ORDER — CALCIUM GLUCONATE-NACL 1-0.675 GM/50ML-% IV SOLN
1.0000 g | Freq: Once | INTRAVENOUS | Status: AC
  Administered 2020-03-16: 1000 mg via INTRAVENOUS
  Filled 2020-03-16: qty 50

## 2020-03-16 MED ORDER — FENTANYL BOLUS VIA INFUSION
50.0000 ug | INTRAVENOUS | Status: DC | PRN
  Filled 2020-03-16: qty 50

## 2020-03-16 MED ORDER — HEPARIN SODIUM (PORCINE) 5000 UNIT/ML IJ SOLN: 5000.0000 [IU] | Freq: Three times a day (TID) | INTRAMUSCULAR | Status: DC

## 2020-03-16 MED ORDER — INSULIN DETEMIR 100 UNIT/ML ~~LOC~~ SOLN
10.0000 [IU] | Freq: Two times a day (BID) | SUBCUTANEOUS | Status: DC
  Administered 2020-03-16: 10 [IU] via SUBCUTANEOUS
  Filled 2020-03-16 (×3): qty 0.1

## 2020-03-16 MED ORDER — ALTEPLASE 2 MG IJ SOLR
2.0000 mg | Freq: Once | INTRAMUSCULAR | Status: AC
  Administered 2020-03-16: 2 mg
  Filled 2020-03-16: qty 2

## 2020-03-16 MED ORDER — PHENYLEPHRINE CONCENTRATED 100MG/250ML (0.4 MG/ML) INFUSION SIMPLE
0.0000 ug/min | INTRAVENOUS | Status: DC
  Administered 2020-03-16: 60 ug/min via INTRAVENOUS
  Administered 2020-03-17 (×3): 400 ug/min via INTRAVENOUS
  Filled 2020-03-16 (×5): qty 250

## 2020-03-16 MED ORDER — DOCUSATE SODIUM 50 MG/5ML PO LIQD
100.0000 mg | Freq: Two times a day (BID) | ORAL | Status: DC
  Administered 2020-03-16 – 2020-03-17 (×2): 100 mg
  Filled 2020-03-16 (×2): qty 10

## 2020-03-16 MED ORDER — INSULIN ASPART 100 UNIT/ML IV SOLN
10.0000 [IU] | Freq: Once | INTRAVENOUS | Status: AC
  Administered 2020-03-16: 10 [IU] via INTRAVENOUS
  Filled 2020-03-16: qty 0.1

## 2020-03-16 MED ORDER — ACETAMINOPHEN 325 MG PO TABS: 650.0000 mg | ORAL_TABLET | Freq: Four times a day (QID) | ORAL | Status: DC | PRN

## 2020-03-16 MED FILL — Sodium Chloride IV Soln 0.9%: INTRAVENOUS | Qty: 100 | Status: AC

## 2020-03-16 MED FILL — Vasopressin IV Soln 20 Unit/ML (For IV Infusion): INTRAVENOUS | Qty: 1 | Status: AC

## 2020-03-16 NOTE — Progress Notes (Signed)
CRITICAL CARE NOTE  Admitted for Severe resp failure,On biPAP, COVID 19 pneumonia 8/17 intubated for severe hypoxia 8/18 severe ARDS, Multiorgan failure   CC  follow up respiratory failure  SUBJECTIVE Patient remains critically ill Prognosis is guarded   BP (!) 81/37   Pulse (!) 113   Temp (!) 101.1 F (38.4 C) (Esophageal)   Resp (!) 30   Ht 5\' 1"  (1.549 m)   Wt 100.5 kg   LMP 03/12/2020   SpO2 93%   BMI 41.86 kg/m    I/O last 3 completed shifts: In: 6236.2 [I.V.:4333.6; NG/GT:100; IV Piggyback:1802.6] Out: 1600 [Urine:1600] No intake/output data recorded.  SpO2: 93 % O2 Flow Rate (L/min): 50 L/min FiO2 (%): 100 %  Estimated body mass index is 41.86 kg/m as calculated from the following:   Height as of this encounter: 5\' 1"  (1.549 m).   Weight as of this encounter: 100.5 kg.  SIGNIFICANT EVENTS   REVIEW OF SYSTEMS  PATIENT IS UNABLE TO PROVIDE COMPLETE REVIEW OF SYSTEMS DUE TO SEVERE CRITICAL ILLNESS        PHYSICAL EXAMINATION:  GENERAL:critically ill appearing, +resp distress HEAD: Normocephalic, atraumatic.  EYES: Pupils equal, round, reactive to light.  No scleral icterus.  MOUTH: Moist mucosal membrane. NECK: Supple.  PULMONARY: +rhonchi, +wheezing CARDIOVASCULAR: S1 and S2. Regular rate and rhythm. No murmurs, rubs, or gallops.  GASTROINTESTINAL: Soft, nontender, -distended.  Positive bowel sounds.   MUSCULOSKELETAL: No swelling, clubbing, or edema.  NEUROLOGIC: obtunded, GCS<8 SKIN:intact,warm,dry  MEDICATIONS: I have reviewed all medications and confirmed regimen as documented   CULTURE RESULTS   Recent Results (from the past 240 hour(s))  SARS Coronavirus 2 by RT PCR (hospital order, performed in [facility] hospital lab) Nasopharyngeal Nasopharyngeal Swab     Status: Abnormal   Collection Time: 03/25/2020  3:30 PM   Specimen: Nasopharyngeal Swab  Result Value Ref Range Status   SARS Coronavirus 2 POSITIVE (A) NEGATIVE Final     Comment: RESULT CALLED TO, READ BACK BY AND VERIFIED WITH: JENNIFER WHITLEY RN AT 3818 ON 03/15/2020 SNG (NOTE) SARS-CoV-2 target nucleic acids are DETECTED  SARS-CoV-2 RNA is generally detectable in upper respiratory specimens  during the acute phase of infection.  Positive results are indicative  of the presence of the identified virus, but do not rule out bacterial infection or co-infection with other pathogens not detected by the test.  Clinical correlation with patient history and  other diagnostic information is necessary to determine patient infection status.  The expected result is negative.  Fact Sheet for Patients:   StrictlyIdeas.no   Fact Sheet for Healthcare Providers:   BankingDealers.co.za    This test is not yet approved or cleared by the [place] FDA and  has been authorized for detection and/or diagnosis of SARS-CoV-2 by FDA under an Emergency Use Authorization (EUA).  This EUA will remain in effect (meaning  this test can be used) for the duration of  the COVID-19 declaration under Section 564(b)(1) of the Act, 21 U.S.C. section 360-bbb-3(b)(1), unless the authorization is terminated or revoked sooner.  Performed at [facility], [address] [place], [place] [address]   Blood culture (routine x 2)     Status: None   Collection Time: 03/21/2020  3:30 PM   Specimen: BLOOD  Result Value Ref Range Status   Specimen Description BLOOD BLOOD RIGHT ARM  Final   Special Requests   Final    BOTTLES DRAWN AEROBIC AND ANAEROBIC Blood Culture adequate volume  Culture   Final    NO GROWTH 5 DAYS Performed at [facility], [address] [place], [place] [address]    Report Status 03/12/2020 FINAL  Final  Blood culture (routine x 2)     Status: None   Collection Time: 03/16/2020  3:30 PM   Specimen: BLOOD  Result Value Ref Range Status   Specimen Description BLOOD BLOOD LEFT ARM  Final   Special  Requests   Final    BOTTLES DRAWN AEROBIC AND ANAEROBIC Blood Culture adequate volume   Culture   Final    NO GROWTH 5 DAYS Performed at [facility], [address], [place], [place] [address]    Report Status 03/12/2020 FINAL  Final  MRSA PCR Screening     Status: None   Collection Time: 03/08/20  9:55 PM   Specimen: Nasal Mucosa; Nasopharyngeal  Result Value Ref Range Status   MRSA by PCR NEGATIVE NEGATIVE Final    Comment:        The GeneXpert MRSA Assay (FDA approved for NASAL specimens only), is one component of a comprehensive MRSA colonization surveillance program. It is not intended to diagnose MRSA infection nor to guide or monitor treatment for MRSA infections. Performed at [facility], [place],  [address]           IMAGING    DG Abd 1 View  Result Date: 03/15/2020 CLINICAL DATA:  Orogastric tube placement EXAM: ABDOMEN - 1 VIEW COMPARISON:  None. FINDINGS: Tip and side port of the orogastric tube project within the stomach. The stomach is no longer distended with gas. IMPRESSION: Orogastric tube tip in the stomach. Electronically Signed   By: Ulyses Jarred M.D.   On: 03/15/2020 20:54     Nutrition Status: Nutrition Problem: Increased nutrient needs Etiology: catabolic illness (COVID 19) Signs/Symptoms: estimated needs       Indwelling Urinary Catheter continued, requirement due to   Reason to continue Indwelling Urinary Catheter strict Intake/Output monitoring for hemodynamic instability   Central Line/ continued, requirement due to  Reason to continue [place] of central venous pressure or other hemodynamic parameters and poor IV access   Ventilator continued, requirement due to severe respiratory failure   Ventilator Sedation RASS 0 to -2      ASSESSMENT AND PLAN SYNOPSIS  Acute hypoxemic respiratory failure due to COVID-19 pneumonia / ARDS Mechanical ventilation via ARDS protocol,  target PRVC 6 cc/kg Wean PEEP and FiO2 as able Goal plateau pressure less than 30, driving pressure less than 15 Paralytics if necessary for vent synchrony, gas exchange Cycle prone positioning if necessary for oxygenation Deep sedation per PAD protocol, goal RASS -4, currently fentanyl, midazolam Diuresis as blood pressure and renal function can tolerate, goal CVP 5-8.   diuresis as tolerated based on Kidney function VAP prevention order set Follow inflammatory markers: Ferritin, D-dimer, CRP, IL-6, LDH Assess for possible tocilizumab  Vitamin C, zinc Plan to repeat and check resp cultures    Severe ACUTE Hypoxic and Hypercapnic Respiratory Failure -continue Full MV support -continue Bronchodilator Therapy -Wean Fio2 and PEEP as tolerated -VAP/VENT bundle implementation   obesity, possible OSA.   Will certainly impact respiratory mechanics, ventilator weaning  ACUTE KIDNEY INJURY/Renal Failure -continue Foley Catheter-assess need -Avoid nephrotoxic agents -Follow urine output, BMP -Ensure adequate renal perfusion, optimize oxygenation -Renal dose medications Plan for CRRT     NEUROLOGY Acute toxic metabolic encephalopathy   SHOCK-SEPSIS/HYPOVOLUMIC/CARDIOGENIC -use vasopressors to keep MAP>65 -follow  ABG and LA -follow up cultures -emperic ABX -stress dose steroids -aggressive IV fluid resuscitation  CARDIAC ICU monitoring  ID -continue IV abx as prescibed -follow up cultures  GI GI PROPHYLAXIS as indicated  NUTRITIONAL STATUS Nutrition Status: Nutrition Problem: Increased nutrient needs Etiology: catabolic illness (COVID 19) Signs/Symptoms: estimated needs     DIET-->TF's as tolerated Constipation protocol as indicated  ENDO - will use ICU hypoglycemic\Hyperglycemia protocol if indicated     ELECTROLYTES -follow labs as needed -replace as needed -pharmacy consultation and following   DVT/GI PRX ordered and assessed TRANSFUSIONS AS  NEEDED MONITOR FSBS I Assessed the need for Labs I Assessed the need for Foley I Assessed the need for Central Venous Line Family Discussion when available I Assessed the need for Mobilization I made an Assessment of medications to be adjusted accordingly Safety Risk assessment completed   CASE DISCUSSED IN MULTIDISCIPLINARY ROUNDS WITH ICU TEAM  Critical Care Time devoted to patient care services described in this note is 32 minutes.   Overall, patient is critically ill, prognosis is guarded.  Patient with Multiorgan failure and at high risk for cardiac arrest and death.    Corrin Parker, M.D.  [facility]  Medical Director [place] Director [facility] Cardio-Pulmonary Department

## 2020-03-16 NOTE — Progress Notes (Signed)
Brief Pharmacy Note  Patient started on CRRT. Consult for review of medications. Medications reviewed. No adjustments required at this time. Continue to monitor.  Dorena Bodo, PharmD

## 2020-03-16 NOTE — Procedures (Signed)
Endotracheal Intubation: Patient required placement of an artificial airway secondary to Respiratory Failure  Consent: Emergent exchange of existing ETT  Hand washing performed prior to starting the procedure.     A time out procedure was called and correct patient, name, & ID confirmed. Needed supplies and equipment were assembled and checked to include ETT, 10 ml syringe, Glidescope, Mac and Miller blades, suction, oxygen and bag mask valve, end tidal CO2 monitor.   Patient was positioned to align the mouth and pharynx to facilitate visualization of the glottis.   Heart rate, SpO2 and blood pressure was continuously monitored during the procedure. Pre-oxygenation was conducted prior to intubation and endotracheal tube was placed through the vocal cords into the trachea.     The artificial airway was placed under direct visualization via glidescope route using a 8.0 ETT on the first attempt.  ETT was secured at 23 cm mark.  Placement was confirmed by auscuitation of lungs with good breath sounds bilaterally and no stomach sounds.  Condensation was noted on endotracheal tube.   Pulse ox 98%.  CO2 detector in place with appropriate color change.   Complications: None .   Operator: Jossette Zirbel.   Chest radiograph ordered and pending.   Comments: OGT placed via glidescope.  Corrin Parker, M.D.  [facility]  Medical Director [place] Director [facility] Cardio-Pulmonary Department

## 2020-03-16 NOTE — Progress Notes (Signed)
Pharmacy Heparin Induced Thrombocytopenia (HIT) Note:  Kathleen Espinoza is an 49 y.o. 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 Patient was on therapeutic Lovenox, which was subsequently decreased to VTE prophylaxis dosing. Patient with > 50% drop in platelets.  HIT labs were ordered on 8/18 when platelets dropped to 80.  Patient started on CRRT 8/18. Filter clotting off after start. Will start argatroban rather than heparin drip with concern for drop in platelets.    Goal APTT: 50-90 sec  Plan: Start argatroban infusion at 0.5 mcg/kg/min. Will plan to check APTT every 2 hours for now. Once therapeutic x 2, will check daily. CBC with morning labs.   8/18 @ 2041 aPTT 136. Supratherapeutic. Confirmed with nurse no bleeding concerns and level was not drawn near the site of the infusion. D/w nurse that the rate will be reduced by 30% (0.35 mcg/kg/min) and informed nurse of changes. Will check aPTT in 2 hours.   Rowland Lathe, PharmD 03/16/2020, 9:38 PM

## 2020-03-16 NOTE — Progress Notes (Signed)
12.5 grams D50 + 10 units insulin + ca gluconate given for hyperkalemia.

## 2020-03-16 NOTE — Progress Notes (Signed)
Pharmacy Heparin Induced Thrombocytopenia (HIT) Note:  ELLICE Espinoza is an 49 y.o. 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 Patient was on therapeutic Lovenox, which was subsequently decreased to VTE prophylaxis dosing. Patient with > 50% drop in platelets.  HIT labs were ordered on 8/18 when platelets dropped to 80.  Patient started on CRRT 8/18. Filter clotting off after start. Will start argatroban rather than heparin drip with concern for drop in platelets.    Goal APTT: 50-90 sec  Plan: Start argatroban infusion at 0.5 mcg/kg/min. Will plan to check APTT every 2 hours for now. Once therapeutic x 2, will check daily. CBC with morning labs.    Kathleen Espinoza, PharmD 03/16/2020, 3:55 PM

## 2020-03-16 NOTE — Procedures (Signed)
Central Venous Catheter Insertion Procedure Note  Kathleen Espinoza  [id number]  1970/10/24  Date:03/16/20  Time:11:52 AM   Provider Performing:Bradrick Kamau D [name]   Procedure: Insertion of Non-tunneled Central Venous Catheter(36556)with US guidance (03500)    Indication(s) Hemodialysis  Consent Risks of the procedure as well as the alternatives and risks of each were explained to the patient and/or caregiver.  Consent for the procedure was obtained and is signed in the bedside chart  Anesthesia Topical only with 1% lidocaine   Timeout Verified patient identification, verified procedure, site/side was marked, verified correct patient position, special equipment/implants available, medications/allergies/relevant history reviewed, required imaging and test results available.  Sterile Technique Maximal sterile technique including full sterile barrier drape, hand hygiene, sterile gown, sterile gloves, mask, hair covering, sterile ultrasound probe cover (if used).  Procedure Description Area of catheter insertion was cleaned with chlorhexidine and draped in sterile fashion.   With real-time ultrasound guidance a HD catheter was placed into the right internal jugular vein.  Nonpulsatile blood flow and easy flushing noted in all ports.  The catheter was sutured in place and sterile dressing applied.  Complications/Tolerance None; patient tolerated the procedure well. Chest X-ray is ordered to verify placement for internal jugular or subclavian cannulation.  Chest x-ray is not ordered for femoral cannulation.  EBL Minimal  Specimen(s) None    Line was secured at the 15 cm mark.  BIOPATCH was applied to the insertion site.     Darel Hong, AGACNP-BC [facility] Pager: [phone]

## 2020-03-16 NOTE — Plan of Care (Signed)
  Problem: Clinical Measurements: Goal: Cardiovascular complication will be avoided Outcome: Progressing   Problem: Activity: Goal: Risk for activity intolerance will decrease Outcome: Progressing   Problem: Pain Managment: Goal: General experience of comfort will improve Outcome: Progressing   Problem: Safety: Goal: Ability to remain free from injury will improve Outcome: Progressing   Problem: Skin Integrity: Goal: Risk for impaired skin integrity will decrease Outcome: Progressing   Problem: Clinical Measurements: Goal: Respiratory complications will improve Outcome: Not Progressing   Problem: Nutrition: Goal: Adequate nutrition will be maintained Outcome: Not Progressing   Problem: Elimination: Goal: Will not experience complications related to bowel motility Outcome: Not Progressing

## 2020-03-16 NOTE — Progress Notes (Signed)
Pt consent obtained from husband by Darel Hong NP. Time out performed at 1103. See flowsheet for details. Procedure performed by Darel Hong NP with Juventino Slovak RN assisting. Placed at 1134. Pt desaturated to 82% during procedure. Vecuronium given. Xray ordered.

## 2020-03-16 NOTE — Consult Note (Signed)
[address] [place], [place] [address] Phone [phone]. Fax [phone]  Date: 03/16/2020                  Patient Name:  Kathleen Espinoza  MRN: [id number]  DOB: [birth year]  Age / Sex: 49 y.o., female         PCP: Einar Pheasant, MD                 Service Requesting Consult: IM/ Flora Lipps, MD                 Reason for Consult: ARF            History of Present Illness: Patient is a 49 y.o. female with medical problems of HTN, who was admitted to [facility] on 03/13/2020 for evaluation of SOB, Covid (+) .  Patient initially presented on August 9 to the emergency room with cough and mild exertional dyspnea that began with 10 to 12 days before presentation.  She tested positive for Covid 19 at [facility].  She felt short of breath and generalized weakness to the point that she was unable to perform her ADLs therefore decided to present to the hospital.  Transferred to ICU on August 11 for worsening shortness of breath.  Required BiPAP. Patient intubated at 2 AM on August 17 for acute respiratory distress  Nephrology consult has now been requested for AKI and hyperkalemia Serum creatinine trends are summarized below Patient has multiorgan failure, respiratory failure from COVID-19 pneumonia  Neuro: Patient is currently sedated with fentanyl  Pulmonary.  Patient is intubated, requiring ventilator support.  FiO2 100% Cardiovascular: Requiring high-dose norepinephrine, sinus tachycardia GI: OG tube in place. Renal: Oliguric.  Serum creatinine trend is worsening as noted below   Lab Results  Component Value Date   CREATININE 3.67 (H) 03/16/2020   CREATININE 3.27 (H) 03/16/2020   CREATININE 1.76 (H) 03/15/2020    08/17 0701 - 08/18 0700 In: 8242 [I.V.:4181; NG/GT:100; IV Piggyback:1715] Out: 550 [Urine:550]   Medications: Outpatient medications: Medications Prior to Admission  Medication Sig Dispense Refill Last Dose  . fluticasone (FLONASE) 50 MCG/ACT nasal  spray Place 2 sprays into both nostrils daily as needed for allergies or rhinitis. After nasal saline 16 g 2 PRN at PRN  . hydrochlorothiazide (MICROZIDE) 12.5 MG capsule TAKE 1 CAPSULE BY MOUTH  DAILY 90 capsule 3 03/06/2020 at Unknown time  . lisinopril (ZESTRIL) 40 MG tablet TAKE 1 TABLET BY MOUTH  DAILY 90 tablet 3 03/06/2020 at Unknown time  . loratadine (CLARITIN) 10 MG tablet Take 1 tablet (10 mg total) by mouth daily as needed for allergies. 90 tablet 3 03/06/2020 at Unknown time  . omeprazole (PRILOSEC) 20 MG capsule Take 1 capsule (20 mg total) by mouth daily. 30 capsule 2 03/06/2020 at Unknown time  . rosuvastatin (CRESTOR) 10 MG tablet TAKE 1 TABLET(10 MG) BY MOUTH DAILY 30 tablet 2 03/06/2020 at Unknown time  . sodium chloride (OCEAN) 0.65 % SOLN nasal spray Place 2 sprays into both nostrils daily as needed for congestion. 30 mL 2 Past Week at PRN    Current medications: Current Facility-Administered Medications  Medication Dose Route Frequency Provider Last Rate Last Admin  . acetaminophen (TYLENOL) tablet 650 mg  650 mg Oral Q6H PRN Lorella Nimrod, MD   650 mg at 03/16/20 0139  . ascorbic acid (VITAMIN C) tablet 500 mg  500 mg Oral Daily Lorella Nimrod, MD   500 mg at 03/15/20 1153  .  atropine 1 MG/10ML injection 1 mg  1 mg Intravenous PRN Awilda Bill, NP      . azithromycin (ZITHROMAX) 500 mg in sodium chloride 0.9 % 250 mL IVPB  500 mg Intravenous Q24H Lorella Nimrod, MD   Stopped at 03/15/20 1859  . cefTRIAXone (ROCEPHIN) 2 g in sodium chloride 0.9 % 100 mL IVPB  2 g Intravenous Q24H Cassandria Santee, MD   Stopped at 03/15/20 1530  . chlorhexidine gluconate (MEDLINE KIT) (PERIDEX) 0.12 % solution 15 mL  15 mL Mouth Rinse BID Darel Hong D, NP   15 mL at 03/16/20 0754  . Chlorhexidine Gluconate Cloth 2 % PADS 6 each  6 each Topical Daily Awilda Bill, NP   6 each at 03/15/20 1042  . chlorpheniramine-HYDROcodone (TUSSIONEX) 10-8 MG/5ML suspension 5 mL  5 mL Oral Q12H PRN Lorella Nimrod, MD   5 mL at 03/12/20 2109  . docusate (COLACE) 50 MG/5ML liquid 100 mg  100 mg Oral BID Darel Hong D, NP   100 mg at 03/15/20 2201  . enoxaparin (LOVENOX) injection 40 mg  40 mg Subcutaneous Q12H Flora Lipps, MD   40 mg at 03/16/20 0754  . famotidine (PEPCID) IVPB 20 mg premix  20 mg Intravenous Q12H Bradly Bienenstock, NP   Stopped at 03/15/20 2249  . fentaNYL (SUBLIMAZE) bolus via infusion 50 mcg  50 mcg Intravenous Q15 min PRN Bradly Bienenstock, NP   50 mcg at 03/15/20 1805  . fentaNYL 2534mg in NS 2540m(1051mml) infusion-PREMIX  50-200 mcg/hr Intravenous Continuous KeeDarel Hong NP 20 mL/hr at 03/16/20 0600 200 mcg/hr at 03/16/20 0600  . guaiFENesin-dextromethorphan (ROBITUSSIN DM) 100-10 MG/5ML syrup 10 mL  10 mL Oral Q4H PRN AmiLorella NimrodD   10 mL at 03/08/20 1416  . hydrocortisone sodium succinate (SOLU-CORTEF) 100 MG injection 50 mg  50 mg Intravenous Q6H KasFlora LippsD   50 mg at 03/16/20 0401  . insulin aspart (novoLOG) injection 0-20 Units  0-20 Units Subcutaneous Q4H Tukov-Yual, Magdalene S, NP   7 Units at 03/16/20 0754  . insulin detemir (LEVEMIR) injection 8 Units  8 Units Subcutaneous BID AmiLorella NimrodD   8 Units at 03/15/20 2231  . Ipratropium-Albuterol (COMBIVENT) respimat 1 puff  1 puff Inhalation Q6H AmiLorella NimrodD   1 puff at 03/14/20 1709  . loratadine (CLARITIN) tablet 10 mg  10 mg Oral Daily PRN AmiLorella NimrodD      . MEDLINE mouth rinse  15 mL Mouth Rinse BID AmiLorella NimrodD   15 mL at 03/14/20 2152  . MEDLINE mouth rinse  15 mL Mouth Rinse 10 times per day KeeDarel Hong NP   15 mL at 03/16/20 0530  . melatonin tablet 2.5 mg  2.5 mg Oral QHS Tukov-Yual, Magdalene S, NP   2.5 mg at 03/15/20 2200  . midazolam (VERSED) 50 mg in dextrose 5 % 50 mL (1 mg/mL) infusion  0.5-10 mg/hr Intravenous Continuous KasFlora LippsD 5 mL/hr at 03/16/20 0600 5 mg/hr at 03/16/20 0600  . midazolam (VERSED) injection 2 mg  2 mg Intravenous Q15 min PRN  KeeDarel Hong NP      . midazolam (VERSED) injection 2 mg  2 mg Intravenous Q2H PRN KeeDarel Hong NP      . morphine 2 MG/ML injection 2 mg  2 mg Intravenous Q3H PRN Tukov-Yual, Magdalene S, NP   2 mg at 03/14/20 2210  . multivitamin with minerals tablet 1 tablet  1 tablet Oral Daily Lorella Nimrod, MD   1 tablet at 03/15/20 1156  . norepinephrine (LEVOPHED) 16 mg in 231m premix infusion  0-40 mcg/min Intravenous Titrated KDarel HongD, NP 18.75 mL/hr at 03/16/20 0644 20 mcg/min at 03/16/20 0644  . ondansetron (ZOFRAN) tablet 4 mg  4 mg Oral Q6H PRN ALorella Nimrod MD       Or  . ondansetron (ZOFRAN) injection 4 mg  4 mg Intravenous Q6H PRN ALorella Nimrod MD   4 mg at 03/12/20 0101  . pantoprazole (PROTONIX) injection 40 mg  40 mg Intravenous Q24H BAwilda Bill NP   40 mg at 03/15/20 1154  . phenylephrine (NEO-SYNEPHRINE) 10 mg in sodium chloride 0.9 % 250 mL (0.04 mg/mL) infusion  0-400 mcg/min Intravenous Titrated KFlora Lipps MD   Paused at 03/16/20 0116  . polyethylene glycol (MIRALAX / GLYCOLAX) packet 17 g  17 g Oral Daily PRN ALorella Nimrod MD      . polyethylene glycol (MIRALAX / GLYCOLAX) packet 17 g  17 g Oral Daily KDarel HongD, NP   17 g at 03/15/20 1156  . sodium bicarbonate 150 mEq in sterile water 1,000 mL infusion   Intravenous Continuous KFlora Lipps MD 75 mL/hr at 03/16/20 0600 Rate Verify at 03/16/20 0600  . sodium chloride flush (NS) 0.9 % injection 10-40 mL  10-40 mL Intracatheter Q12H ALorella Nimrod MD   10 mL at 03/15/20 2202  . sodium chloride flush (NS) 0.9 % injection 10-40 mL  10-40 mL Intracatheter PRN ALorella Nimrod MD      . sodium chloride flush (NS) 0.9 % injection 10-40 mL  10-40 mL Intracatheter Q12H KFlora Lipps MD   10 mL at 03/15/20 2201  . sodium chloride flush (NS) 0.9 % injection 10-40 mL  10-40 mL Intracatheter PRN KFlora Lipps MD   10 mL at 03/14/20 2151  . sodium chloride flush (NS) 0.9 % injection 3 mL  3 mL Intravenous Q12H ALorella Nimrod MD   3 mL at 03/15/20 2202  . vasopressin (PITRESSIN) 20 Units in sodium chloride 0.9 % 100 mL infusion-*FOR SHOCK*  0-0.04 Units/min Intravenous Continuous BAwilda Bill NP 12 mL/hr at 03/16/20 0600 0.04 Units/min at 03/16/20 0600  . vecuronium (NORCURON) injection 10 mg  10 mg Intravenous Q2H PRN KDarel HongD, NP   10 mg at 03/15/20 2359  . zinc sulfate capsule 220 mg  220 mg Oral Daily ALorella Nimrod MD   220 mg at 03/15/20 1153      Allergies: Allergies  Allergen Reactions  . Norvasc [Amlodipine Besylate] Swelling  . Betadine [Povidone Iodine] Swelling  . Iodine Swelling      Past Medical History: Past Medical History:  Diagnosis Date  . Abnormal pap    required freezing  . Hypercholesterolemia   . Hypertension   . Microscopic hematuria    s/p renal biopsy  . Thin basement membrane disease      Past Surgical History: Past Surgical History:  Procedure Laterality Date  . TONSILLECTOMY    . TUBAL LIGATION       Family History: Family History  Problem Relation Age of Onset  . Colon cancer Mother        died - age 627 . Hypertension Father   . Colon cancer Maternal Grandfather   . Breast cancer Maternal Aunt 60  . Breast cancer Paternal AAunt 59    Social History: Social History   Socioeconomic History  . Marital status: Married  Spouse name: Not on file  . Number of children: 2  . Years of education: Not on file  . Highest education level: Not on file  Occupational History    Employer: [employer]  Tobacco Use  . Smoking status: Never Smoker  . Smokeless tobacco: Never Used  Vaping Use  . Vaping Use: Never used  Substance and Sexual Activity  . Alcohol use: No    Alcohol/week: 0.0 standard drinks  . Drug use: No  . Sexual activity: Yes    Birth control/protection: Surgical  Other Topics Concern  . Not on file  Social History Narrative  . Not on file   Social Determinants of Health   Financial Resource Strain:   .  Difficulty of Paying Living Expenses:   Food Insecurity:   . Worried About Charity fundraiser in the Last Year:   . Arboriculturist in the Last Year:   Transportation Needs:   . Film/video editor (Medical):   Marland Kitchen Lack of Transportation (Non-Medical):   Physical Activity:   . Days of Exercise per Week:   . Minutes of Exercise per Session:   Stress:   . Feeling of Stress :   Social Connections:   . Frequency of Communication with Friends and Family:   . Frequency of Social Gatherings with Friends and Family:   . Attends Religious Services:   . Active Member of Clubs or Organizations:   . Attends Archivist Meetings:   Marland Kitchen Marital Status:   Intimate Partner Violence:   . Fear of Current or Ex-Partner:   . Emotionally Abused:   Marland Kitchen Physically Abused:   . Sexually Abused:      Review of Systems: unavaialble - intubated and sedated Gen:  HEENT:  CV:  Resp:  GI: GU :  MS:  Derm:    Psych: Heme:  Neuro:  Endocrine  Vital Signs: Blood pressure (!) 81/37, pulse (!) 113, temperature (!) 101.1 F (38.4 C), temperature source Esophageal, resp. rate (!) 30, height 5' 1"  (1.549 m), weight 100.5 kg, last menstrual period 03/23/2020, SpO2 93 %.   Intake/Output Summary (Last 24 hours) at 03/16/2020 0835 Last data filed at 03/16/2020 0600 Gross per 24 hour  Intake 5995.98 ml  Output 550 ml  Net 5445.98 ml    Weight trends: Filed Weights   03/14/20 0405 03/15/20 0406 03/16/20 0159  Weight: 99.9 kg 100 kg 100.5 kg    Physical Exam: General:  Critically ill-appearing, laying in the bed  HEENT  pupils are small, pinpoint, round, anicteric  Lungs:  Ventilator assisted, FiO2 100%  Heart::  Regular, tachycardic  Abdomen:  Soft, nondistended  Extremities:  Trace dependent edema  Neurologic:  Sedated, not following commands  Skin:  Warm, no acute rashes  Access:  To be placed  Foley:  Present with small amount of yellow urine       Lab results: Basic Metabolic  Panel: Recent Labs  Lab 03/11/20 0136 03/11/20 0136 03/12/20 0529 03/12/20 0529 03/13/20 1056 03/14/20 0815 03/15/20 0500 03/16/20 0400 03/16/20 0550  NA 146*   < > 143   < > 145   < > 143 146* 147*  K 4.5   < > 3.7   < > 3.3*   < > 4.7 5.6* 5.8*  CL 105   < > 101   < > 102   < > 110 112* 110  CO2 30   < > 29   < > 29   < >  19* 21* 21*  GLUCOSE 223*   < > 245*   < > 151*   < > 237* 214* 214*  BUN 27*   < > 24*   < > 24*   < > 30* 44* 46*  CREATININE 0.84   < > 0.67   < > 0.72   < > 1.76* 3.27* 3.67*  CALCIUM 8.0*   < > 7.7*   < > 7.8*   < > 7.2* 6.4* 7.0*  MG 2.4  --  2.3  --  2.3  --   --   --   --   PHOS 4.4  --  4.7*  --  3.2  --   --   --   --    < > = values in this interval not displayed.    Liver Function Tests: Recent Labs  Lab 03/16/20 0550  AST 644*  ALT 431*  ALKPHOS 149*  BILITOT 0.7  PROT 5.3*  ALBUMIN 2.6*   No results for input(s): LIPASE, AMYLASE in the last 168 hours. No results for input(s): AMMONIA in the last 168 hours.  CBC: Recent Labs  Lab 03/13/20 1056 03/14/20 0815 03/16/20 0400 03/16/20 0550  WBC 15.4*   < > 33.7* 35.1*  NEUTROABS 14.0*  --   --  29.5*  HGB 12.6   < > 11.1* 11.5*  HCT 38.4   < > 36.4 38.6  MCV 77.7*   < > 85.2 86.2  PLT 437*   < > 82* 80*   < > = values in this interval not displayed.    Cardiac Enzymes: No results for input(s): CKTOTAL, TROPONINI in the last 168 hours.  BNP: Invalid input(s): POCBNP  CBG: Recent Labs  Lab 03/15/20 1601 03/15/20 1934 03/15/20 2346 03/16/20 0334 03/16/20 0753  GLUCAP 197* 210* 202* 183* 203*    Microbiology: Recent Results (from the past 720 hour(s))  SARS Coronavirus 2 by RT PCR (hospital order, performed in [facility] hospital lab) Nasopharyngeal Nasopharyngeal Swab     Status: Abnormal   Collection Time: 03/03/2020  3:30 PM   Specimen: Nasopharyngeal Swab  Result Value Ref Range Status   SARS Coronavirus 2 POSITIVE (A) NEGATIVE Final    Comment: RESULT CALLED  TO, READ BACK BY AND VERIFIED WITH: JENNIFER WHITLEY RN AT 2248 ON 03/18/2020 SNG (NOTE) SARS-CoV-2 target nucleic acids are DETECTED  SARS-CoV-2 RNA is generally detectable in upper respiratory specimens  during the acute phase of infection.  Positive results are indicative  of the presence of the identified virus, but do not rule out bacterial infection or co-infection with other pathogens not detected by the test.  Clinical correlation with patient history and  other diagnostic information is necessary to determine patient infection status.  The expected result is negative.  Fact Sheet for Patients:   StrictlyIdeas.no   Fact Sheet for Healthcare Providers:   BankingDealers.co.za    This test is not yet approved or cleared by the [place] FDA and  has been authorized for detection and/or diagnosis of SARS-CoV-2 by FDA under an Emergency Use Authorization (EUA).  This EUA will remain in effect (meaning  this test can be used) for the duration of  the COVID-19 declaration under Section 564(b)(1) of the Act, 21 U.S.C. section 360-bbb-3(b)(1), unless the authorization is terminated or revoked sooner.  Performed at [facility], [address] [place], [place] [address]   Blood culture (routine x 2)     Status: None  Collection Time: 03/14/2020  3:30 PM   Specimen: BLOOD  Result Value Ref Range Status   Specimen Description BLOOD BLOOD RIGHT ARM  Final   Special Requests   Final    BOTTLES DRAWN AEROBIC AND ANAEROBIC Blood Culture adequate volume   Culture   Final    NO GROWTH 5 DAYS Performed at [facility], [address] [place], [place] [address]    Report Status 03/12/2020 FINAL  Final  Blood culture (routine x 2)     Status: None   Collection Time: 03/25/2020  3:30 PM   Specimen: BLOOD  Result Value Ref Range Status   Specimen Description BLOOD BLOOD LEFT ARM  Final   Special Requests   Final     BOTTLES DRAWN AEROBIC AND ANAEROBIC Blood Culture adequate volume   Culture   Final    NO GROWTH 5 DAYS Performed at [facility], [address], [place], [place] [address]    Report Status 03/12/2020 FINAL  Final  MRSA PCR Screening     Status: None   Collection Time: 03/08/20  9:55 PM   Specimen: Nasal Mucosa; Nasopharyngeal  Result Value Ref Range Status   MRSA by PCR NEGATIVE NEGATIVE Final    Comment:        The GeneXpert MRSA Assay (FDA approved for NASAL specimens only), is one component of a comprehensive MRSA colonization surveillance program. It is not intended to diagnose MRSA infection nor to guide or monitor treatment for MRSA infections. Performed at [facility], [address] [place], [place] [address]      Coagulation Studies: No results for input(s): LABPROT, INR in the last 72 hours.  Urinalysis: No results for input(s): COLORURINE, LABSPEC, PHURINE, GLUCOSEU, HGBUR, BILIRUBINUR, KETONESUR, PROTEINUR, UROBILINOGEN, NITRITE, LEUKOCYTESUR in the last 72 hours.  Invalid input(s): APPERANCEUR      Imaging: DG Abd 1 View  Result Date: 03/15/2020 CLINICAL DATA:  Orogastric tube placement EXAM: ABDOMEN - 1 VIEW COMPARISON:  None. FINDINGS: Tip and side port of the orogastric tube project within the stomach. The stomach is no longer distended with gas. IMPRESSION: Orogastric tube tip in the stomach. Electronically Signed   By: Ulyses Jarred M.D.   On: 03/15/2020 20:54   DG Abd 1 View  Result Date: 03/15/2020 CLINICAL DATA:  Orogastric tube placement EXAM: ABDOMEN - 1 VIEW COMPARISON:  None. FINDINGS: Orogastric tube tip and side port project over the gas distended stomach. IMPRESSION: OG tube tip and side port project within the stomach. Electronically Signed   By: Ulyses Jarred M.D.   On: 03/15/2020 02:52   Portable Chest x-ray  Result Date: 03/15/2020 CLINICAL DATA:  Intubation EXAM: PORTABLE CHEST 1 VIEW COMPARISON:  None. FINDINGS:  Support Apparatus: --Endotracheal tube: Tip just below the level of the clavicular heads. --Enteric tube:Tip and sideport are below the field of view. --Catheter(s):None --Other: None Cardiomediastinal contours are normal. There is moderate interstitial opacity, right greater than left. IMPRESSION: 1. Endotracheal tube tip just below the level of the clavicular heads. 2. Right greater than left interstitial opacities, which may indicate pulmonary edema or infection. Electronically Signed   By: Ulyses Jarred M.D.   On: 03/15/2020 02:51   [place] EKG SITE RITE  Result Date: 03/14/2020 If Site Rite image not attached, placement could not be confirmed due to current cardiac rhythm.     Assessment & Plan: Pt is a 49 y.o.   female with hypertension microscopic hematuria, history of thin basement membrane  disease per chart, tonsillectomy, tubal ligation, hyperlipidemia, new diagnosis of diabetes, was admitted on 03/03/2020 with Pneumonia due to COVID-19 virus [U07.1, J12.82]    #Acute kidney injury #Severe respiratory failure, severe ARDS, multiorgan failure #COVID-19 pneumonia,  #Diabetes mellitus #Hyperkalemia  Plan: With worsening oliguria, developing hyperkalemia and underlying multiorgan failure and sepsis, patient would benefit from renal replacement therapy. Urine output of 500 cc in the last 24 hours which has decreased from 1800 cc the previous day.  Total intake of about 6 L to support blood pressure. Discussed with ICU team for dialysis catheter placement. We will initiate CRRT 2K/2.5 calcium solution Prefilter 300 cc/h Dialysate 1500 cc/h Post filter replacement 300 cc/h Net ultrafiltration 0   LOS: 9 Zachari Alberta 8/18/20218:35 AM    Note: This note was prepared with Dragon dictation. Any transcription errors are unintentional

## 2020-03-16 NOTE — Progress Notes (Signed)
NaHCO3 drip increased to 125 cc/hour as per NP. Vecuronium push given for vent dys synchrony.  Neosynephrine weaned off as per set parameters. Started weaning levophed as MAP=>65.

## 2020-03-16 NOTE — Progress Notes (Signed)
Spoke with patient's nurse regarding POC. Awaiting return call or update to consult from nurse after she speaks further with MD. Fran Lowes, RN VAST

## 2020-03-16 NOTE — Progress Notes (Signed)
Assisted tele visit to patient with family member.  Kathleen Espinoza D Richie Vadala, RN   

## 2020-03-17 DIAGNOSIS — K819 Cholecystitis, unspecified: Secondary | ICD-10-CM

## 2020-03-17 DIAGNOSIS — J1282 Pneumonia due to Coronavirus disease 2019: Secondary | ICD-10-CM | POA: Diagnosis not present

## 2020-03-17 DIAGNOSIS — Z515 Encounter for palliative care: Secondary | ICD-10-CM

## 2020-03-17 DIAGNOSIS — Z978 Presence of other specified devices: Secondary | ICD-10-CM

## 2020-03-17 DIAGNOSIS — J9601 Acute respiratory failure with hypoxia: Secondary | ICD-10-CM | POA: Diagnosis not present

## 2020-03-17 DIAGNOSIS — Z7189 Other specified counseling: Secondary | ICD-10-CM

## 2020-03-17 DIAGNOSIS — U071 COVID-19: Secondary | ICD-10-CM | POA: Diagnosis not present

## 2020-03-17 LAB — BLOOD GAS, ARTERIAL
Acid-base deficit: 17.8 mmol/L — ABNORMAL HIGH (ref 0.0–2.0)
Acid-base deficit: 15.2 mmol/L — ABNORMAL HIGH (ref 0.0–2.0)
Bicarbonate: 15.2 mmol/L — ABNORMAL LOW (ref 20.0–28.0)
Bicarbonate: 17.1 mmol/L — ABNORMAL LOW (ref 20.0–28.0)
FIO2: 1
FIO2: 1
MECHVT: 480 mL
MECHVT: 480 mL
Mechanical Rate: 30
O2 Saturation: 97.9 %
O2 Saturation: 99 %
PEEP: 14 cmH2O
PEEP: 14 cmH2O
Patient temperature: 37
Patient temperature: 37
RATE: 30 resp/min
pCO2 arterial: 76 mmHg (ref 32.0–48.0)
pCO2 arterial: 71 mmHg (ref 32.0–48.0)
pH, Arterial: 6.91 — CL (ref 7.350–7.450)
pH, Arterial: 6.99 — CL (ref 7.350–7.450)
pO2, Arterial: 156 mmHg — ABNORMAL HIGH (ref 83.0–108.0)
pO2, Arterial: 186 mmHg — ABNORMAL HIGH (ref 83.0–108.0)

## 2020-03-17 LAB — COMPREHENSIVE METABOLIC PANEL
ALT: 3410 U/L — ABNORMAL HIGH (ref 0–44)
AST: 6218 U/L — ABNORMAL HIGH (ref 15–41)
Albumin: 2.4 g/dL — ABNORMAL LOW (ref 3.5–5.0)
Alkaline Phosphatase: 269 U/L — ABNORMAL HIGH (ref 38–126)
Anion gap: 19 — ABNORMAL HIGH (ref 5–15)
BUN: 31 mg/dL — ABNORMAL HIGH (ref 6–20)
CO2: 15 mmol/L — ABNORMAL LOW (ref 22–32)
Calcium: 7.1 mg/dL — ABNORMAL LOW (ref 8.9–10.3)
Chloride: 109 mmol/L (ref 98–111)
Creatinine, Ser: 3.17 mg/dL — ABNORMAL HIGH (ref 0.44–1.00)
GFR calc Af Amer: 19 mL/min — ABNORMAL LOW (ref >60)
GFR calc non Af Amer: 16 mL/min — ABNORMAL LOW (ref >60)
Glucose, Bld: 39 mg/dL — CL (ref 70–99)
Potassium: 5.1 mmol/L (ref 3.5–5.1)
Sodium: 143 mmol/L (ref 135–145)
Total Bilirubin: 1.7 mg/dL — ABNORMAL HIGH (ref 0.3–1.2)
Total Protein: 5.1 g/dL — ABNORMAL LOW (ref 6.5–8.1)

## 2020-03-17 LAB — CBC
HCT: 34.1 % — ABNORMAL LOW (ref 36.0–46.0)
Hemoglobin: 9.8 g/dL — ABNORMAL LOW (ref 12.0–15.0)
MCH: 26.1 pg (ref 26.0–34.0)
MCHC: 28.7 g/dL — ABNORMAL LOW (ref 30.0–36.0)
MCV: 90.9 fL (ref 80.0–100.0)
Platelets: 55 10*3/uL — ABNORMAL LOW (ref 150–400)
RBC: 3.75 MIL/uL — ABNORMAL LOW (ref 3.87–5.11)
RDW: 15.9 % — ABNORMAL HIGH (ref 11.5–15.5)
WBC: 37.7 10*3/uL — ABNORMAL HIGH (ref 4.0–10.5)
nRBC: 2.4 % — ABNORMAL HIGH (ref 0.0–0.2)

## 2020-03-17 LAB — APTT
aPTT: >160 seconds (ref 24–36)
aPTT: >160 seconds (ref 24–36)
aPTT: >160 seconds (ref 24–36)

## 2020-03-17 LAB — GLUCOSE, CAPILLARY
Glucose-Capillary: 103 mg/dL — ABNORMAL HIGH (ref 70–99)
Glucose-Capillary: 107 mg/dL — ABNORMAL HIGH (ref 70–99)
Glucose-Capillary: 113 mg/dL — ABNORMAL HIGH (ref 70–99)
Glucose-Capillary: 135 mg/dL — ABNORMAL HIGH (ref 70–99)
Glucose-Capillary: 37 mg/dL — CL (ref 70–99)
Glucose-Capillary: 61 mg/dL — ABNORMAL LOW (ref 70–99)
Glucose-Capillary: 76 mg/dL (ref 70–99)

## 2020-03-17 LAB — MAGNESIUM: Magnesium: 2.4 mg/dL (ref 1.7–2.4)

## 2020-03-17 LAB — HEPARIN INDUCED PLATELET AB (HIT ANTIBODY): Heparin Induced Plt Ab: 0.068 OD (ref 0.000–0.400)

## 2020-03-17 LAB — PHOSPHORUS: Phosphorus: 8.8 mg/dL — ABNORMAL HIGH (ref 2.5–4.6)

## 2020-03-17 LAB — FIBRIN DERIVATIVES D-DIMER (ARMC ONLY): Fibrin derivatives D-dimer (ARMC): >7500 ng/mL (FEU) — ABNORMAL HIGH (ref 0.00–499.00)

## 2020-03-17 LAB — TRIGLYCERIDES: Triglycerides: 386 mg/dL — ABNORMAL HIGH (ref <150)

## 2020-03-17 LAB — C-REACTIVE PROTEIN: CRP: 0.6 mg/dL (ref <1.0)

## 2020-03-17 MED ORDER — POLYVINYL ALCOHOL 1.4 % OP SOLN
1.0000 [drp] | Freq: Four times a day (QID) | OPHTHALMIC | Status: DC | PRN
  Filled 2020-03-17: qty 15

## 2020-03-17 MED ORDER — SODIUM BICARBONATE 8.4 % IV SOLN
50.0000 meq | Freq: Once | INTRAVENOUS | Status: AC
  Administered 2020-03-17: 50 meq via INTRAVENOUS

## 2020-03-17 MED ORDER — ARGATROBAN 50 MG/50ML IV SOLN
0.0800 ug/kg/min | INTRAVENOUS | Status: DC
  Administered 2020-03-17: 0.08 ug/kg/min via INTRAVENOUS
  Filled 2020-03-17: qty 50

## 2020-03-17 MED ORDER — DEXTROSE 5 % IV SOLN: INTRAVENOUS | Status: DC

## 2020-03-17 MED ORDER — ARGATROBAN 50 MG/50ML IV SOLN
0.1700 ug/kg/min | INTRAVENOUS | Status: DC
  Filled 2020-03-17: qty 50

## 2020-03-17 MED ORDER — DEXTROSE 50 % IV SOLN
INTRAVENOUS | Status: AC
  Administered 2020-03-17: 50 mL
  Filled 2020-03-17: qty 50

## 2020-03-17 MED ORDER — GLYCOPYRROLATE 0.2 MG/ML IJ SOLN: 0.2000 mg | INTRAMUSCULAR | Status: DC | PRN

## 2020-03-17 MED ORDER — HYDROXYCHLOROQUINE SULFATE 200 MG PO TABS
200.0000 mg | ORAL_TABLET | Freq: Two times a day (BID) | ORAL | Status: DC
  Filled 2020-03-17: qty 1

## 2020-03-17 MED ORDER — SODIUM BICARBONATE 8.4 % IV SOLN
INTRAVENOUS | Status: AC
  Administered 2020-03-17: 150 meq via INTRAVENOUS
  Filled 2020-03-17: qty 150

## 2020-03-17 MED ORDER — HYDROXYCHLOROQUINE SULFATE 200 MG PO TABS: 200.0000 mg | ORAL_TABLET | Freq: Two times a day (BID) | ORAL | Status: DC

## 2020-03-17 MED ORDER — ACETAMINOPHEN 325 MG PO TABS: 650.0000 mg | ORAL_TABLET | Freq: Four times a day (QID) | ORAL | Status: DC | PRN

## 2020-03-17 MED ORDER — MIDAZOLAM HCL 2 MG/2ML IJ SOLN: 2.0000 mg | INTRAMUSCULAR | Status: DC | PRN

## 2020-03-17 MED ORDER — INSULIN ASPART 100 UNIT/ML ~~LOC~~ SOLN: 0.0000 [IU] | SUBCUTANEOUS | Status: DC

## 2020-03-17 MED ORDER — HYDROXYCHLOROQUINE SULFATE 200 MG PO TABS
400.0000 mg | ORAL_TABLET | Freq: Two times a day (BID) | ORAL | Status: DC
  Filled 2020-03-17 (×2): qty 2

## 2020-03-17 MED ORDER — SODIUM BICARBONATE 8.4 % IV SOLN: 150.0000 meq | Freq: Once | INTRAVENOUS | Status: AC

## 2020-03-17 MED ORDER — DEXTROSE 10 % IV SOLN: INTRAVENOUS | Status: DC

## 2020-03-17 MED ORDER — DEXTROSE 50 % IV SOLN: 1.0000 | Freq: Once | INTRAVENOUS | Status: AC

## 2020-03-17 MED ORDER — MORPHINE SULFATE (PF) 2 MG/ML IV SOLN: 2.0000 mg | INTRAVENOUS | Status: DC | PRN

## 2020-03-17 MED ORDER — SODIUM BICARBONATE-DEXTROSE 150-5 MEQ/L-% IV SOLN
150.0000 meq | INTRAVENOUS | Status: DC
  Administered 2020-03-17: 150 meq via INTRAVENOUS
  Filled 2020-03-17 (×4): qty 1000

## 2020-03-17 MED ORDER — HYDROXYCHLOROQUINE SULFATE 200 MG PO TABS
400.0000 mg | ORAL_TABLET | Freq: Two times a day (BID) | ORAL | Status: DC
  Filled 2020-03-17: qty 2

## 2020-03-17 MED ORDER — DIPHENHYDRAMINE HCL 50 MG/ML IJ SOLN: 25.0000 mg | INTRAMUSCULAR | Status: DC | PRN

## 2020-03-17 MED ORDER — HYDROXYCHLOROQUINE SULFATE 200 MG PO TABS
400.0000 mg | ORAL_TABLET | Freq: Two times a day (BID) | ORAL | Status: DC
  Administered 2020-03-17: 400 mg
  Filled 2020-03-17 (×2): qty 2

## 2020-03-17 MED ORDER — ACETAMINOPHEN 650 MG RE SUPP: 650.0000 mg | Freq: Four times a day (QID) | RECTAL | Status: DC | PRN

## 2020-03-17 MED ORDER — DEXTROSE 50 % IV SOLN
1.0000 | Freq: Once | INTRAVENOUS | Status: AC
  Administered 2020-03-17: 50 mL via INTRAVENOUS
  Filled 2020-03-17: qty 50

## 2020-03-17 MED ORDER — GLYCOPYRROLATE 1 MG PO TABS
1.0000 mg | ORAL_TABLET | ORAL | Status: DC | PRN
  Filled 2020-03-17: qty 1

## 2020-03-17 MED ORDER — SODIUM BICARBONATE-DEXTROSE 150-5 MEQ/L-% IV SOLN
150.0000 meq | INTRAVENOUS | Status: DC
  Administered 2020-03-17: 150 meq via INTRAVENOUS
  Filled 2020-03-17: qty 1000

## 2020-03-17 MED ORDER — DEXTROSE 50 % IV SOLN
INTRAVENOUS | Status: AC
  Filled 2020-03-17: qty 50

## 2020-03-17 MED FILL — Vasopressin IV Soln 20 Unit/ML (For IV Infusion): INTRAVENOUS | Qty: 1 | Status: AC

## 2020-03-17 MED FILL — Sodium Chloride IV Soln 0.9%: INTRAVENOUS | Qty: 100 | Status: AC

## 2020-03-30 NOTE — Progress Notes (Signed)
CDS notified per bedside RN request.  Referral # [id number].  Spoke w/ Alesia Morin.  She informed me that pt is a potential donor and that Nori Riis will be following up with bedside RN Eduard Clos.  Info also documented in doc flowsheet.

## 2020-03-30 NOTE — Progress Notes (Addendum)
   03/30/20 1330  Clinical Encounter Type  Visited With Family;Health care provider  Visit Type Initial  Referral From Chaplain  Consult/Referral To Chaplain  Walking into ICU, chaplain noticed a family in the ICU waiting room and she stopped to talk with them. She spoke with patient's husband and sister-in-law. They explained that patient was not doing well and they asked chaplain the protocol when someone is that this stage. Chaplain told them that she could get someone to explain this to them. Chaplain went and spoke with patient's nurse, explaining family's question. Patient's nurse came out and explained what could happen and talked to them about comfort care. After nurse spoke with them, patient's husband called daughter and she wanted to talk to her mother and they wanted to take the phone in themselves. Chaplain spoke with charge nurse and she said the nurse could hold the phone to patient's ear. Chaplain went back to waiting area and explained what could be done. She also explained that when the other daughter arrived that only one person would be allowed in the unit at a time. Patient's husband was visibly upset the entire time. He said that he and patient have been married for 11 yrs and this shouldn't be happening. Chaplain spent a brief time with family once patient's daughter and son-in-law came.  A little while later chaplain went to check on the nurse to see how she was doing ( she waved at family in waiting room) and once in the unit, the patient's nurse told chaplain that she died at 5:28. Chaplain made sure the nurse was alright and went to the waiting room and offered her condolences so the family.

## 2020-03-30 NOTE — Consult Note (Signed)
[address] [place], [place] [address] Phone [phone]. Fax [phone]  Date: 03/22/2020                  Patient Name:  Kathleen Espinoza  MRN: [id number]  DOB: 05-26-1971  Age / Sex: 49 y.o., female         PCP: Einar Pheasant, MD                 Service Requesting Consult: IM/ Flora Lipps, MD                 Reason for Consult: ARF            History of Present Illness: Patient is a 49 y.o. female with medical problems of HTN, who was admitted to [facility] on 03/10/2020 for evaluation of SOB, Covid (+) .  Patient initially presented on August 9 to the emergency room with cough and mild exertional dyspnea that began with 10 to 12 days before presentation.  She tested positive for Covid 19 at [facility].  She felt short of breath and generalized weakness to the point that she was unable to perform her ADLs therefore decided to present to the hospital.  Transferred to ICU on August 11 for worsening shortness of breath.  Required BiPAP. Patient intubated at 2 AM on August 17 for acute respiratory distress  Nephrology consult for AKI and hyperkalemia Serum creatinine trends are summarized below Patient has multiorgan failure, respiratory failure from COVID-19 pneumonia  Patient remains critically ill and is doing poorly. She is severely acidotic with pH of 6.9  Neuro: Patient is currently sedated   Pulmonary.  Patient is intubated, requiring ventilator support.  FiO2 100% Cardiovascular: Requiring multiple high-dose pressors, tachycardic to 120's-140's GI: OG tube in place. Renal: remains Oliguric.  Serum creatinine trend is worsening as noted below  CRRT started 8/20 Technical difficulties with machine this AM  Lab Results  Component Value Date   CREATININE 3.17 (H) 2020/03/22   CREATININE 4.35 (H) 03/16/2020   CREATININE 4.47 (H) 03/16/2020    08/18 0701 - 08/19 0700 In: 3566.7 [I.V.:3256.7; NG/GT:50; IV Piggyback:50] Out: 2002 [Urine:171]   Vital  Signs: Blood pressure (!) 90/24, pulse (!) 127, temperature 99.3 F (37.4 C), resp. rate (!) 30, height 5' 1"  (1.549 m), weight 105.6 kg, last menstrual period 03/11/2020, SpO2 95 %.   Intake/Output Summary (Last 24 hours) at March 22, 2020 0858 Last data filed at 03/22/20 0700 Gross per 24 hour  Intake 3261.44 ml  Output 1942 ml  Net 1319.44 ml    Weight trends: Filed Weights   03/15/20 0406 03/16/20 0159 03/22/20 0500  Weight: 100 kg 100.5 kg 105.6 kg    Physical Exam: General:  Critically ill-appearing, laying in the bed  HEENT  ETT, OGT  Lungs:  Ventilator assisted, FiO2 100%  Heart::  iregular, tachycardic  Abdomen:  Soft, nondistended  Extremities:  Trace dependent edema, cool   Neurologic:  Sedated, not following commands  Foley:  Present with small amount of yellow urine       Lab results: Basic Metabolic Panel: Recent Labs  Lab 03/12/20 0529 03/12/20 0529 03/13/20 1056 03/14/20 0815 03/16/20 0942 03/16/20 1550 03/22/20 0350  NA 143   < > 145   < > 145 144 143  K 3.7   < > 3.3*   < > 5.3* 5.5* 5.1  CL 101   < > 102   < > 107 106 109  CO2 29   < >  29   < > 22 21* 15*  GLUCOSE 245*   < > 151*   < > 215* 246* 39*  BUN 24*   < > 24*   < > 51* 52* 31*  CREATININE 0.67   < > 0.72   < > 4.47* 4.35* 3.17*  CALCIUM 7.7*   < > 7.8*   < > 7.3* 7.1* 7.1*  MG 2.3  --  2.3  --   --   --  2.4  PHOS 4.7*  --  3.2  --   --  9.6*  --    < > = values in this interval not displayed.    Liver Function Tests: Recent Labs  Lab 13-Apr-2020 0350  AST 6,218*  ALT 3,410*  ALKPHOS 269*  BILITOT 1.7*  PROT 5.1*  ALBUMIN 2.4*   No results for input(s): LIPASE, AMYLASE in the last 168 hours. No results for input(s): AMMONIA in the last 168 hours.  CBC: Recent Labs  Lab 03/13/20 1056 03/14/20 0815 03/16/20 0550 2020/04/13 0350  WBC 15.4*   < > 35.1* 37.7*  NEUTROABS 14.0*  --  29.5*  --   HGB 12.6   < > 11.5* 9.8*  HCT 38.4   < > 38.6 34.1*  MCV 77.7*   < > 86.2 90.9   PLT 437*   < > 80* 55*   < > = values in this interval not displayed.    Cardiac Enzymes: No results for input(s): CKTOTAL, TROPONINI in the last 168 hours.  BNP: Invalid input(s): POCBNP  CBG: Recent Labs  Lab 03/16/20 2326 03/16/20 2349 Apr 13, 2020 0338 04-13-2020 0357 April 13, 2020 0731  GLUCAP 69* 135* 37* 107* 103*    Microbiology: Recent Results (from the past 720 hour(s))  SARS Coronavirus 2 by RT PCR (hospital order, performed in [facility] hospital lab) Nasopharyngeal Nasopharyngeal Swab     Status: Abnormal   Collection Time: 03/04/2020  3:30 PM   Specimen: Nasopharyngeal Swab  Result Value Ref Range Status   SARS Coronavirus 2 POSITIVE (A) NEGATIVE Final    Comment: RESULT CALLED TO, READ BACK BY AND VERIFIED WITH: JENNIFER WHITLEY RN AT 9983 ON 03/13/2020 SNG (NOTE) SARS-CoV-2 target nucleic acids are DETECTED  SARS-CoV-2 RNA is generally detectable in upper respiratory specimens  during the acute phase of infection.  Positive results are indicative  of the presence of the identified virus, but do not rule out bacterial infection or co-infection with other pathogens not detected by the test.  Clinical correlation with patient history and  other diagnostic information is necessary to determine patient infection status.  The expected result is negative.  Fact Sheet for Patients:   StrictlyIdeas.no   Fact Sheet for Healthcare Providers:   BankingDealers.co.za    This test is not yet approved or cleared by the [place] FDA and  has been authorized for detection and/or diagnosis of SARS-CoV-2 by FDA under an Emergency Use Authorization (EUA).  This EUA will remain in effect (meaning  this test can be used) for the duration of  the COVID-19 declaration under Section 564(b)(1) of the Act, 21 U.S.C. section 360-bbb-3(b)(1), unless the authorization is terminated or revoked sooner.  Performed at [facility],  [address] [place], [place] [address]   Blood culture (routine x 2)     Status: None   Collection Time: 03/05/2020  3:30 PM   Specimen: BLOOD  Result Value Ref Range Status   Specimen Description BLOOD BLOOD RIGHT ARM  Final   Special Requests   Final    BOTTLES DRAWN AEROBIC AND ANAEROBIC Blood Culture adequate volume   Culture   Final    NO GROWTH 5 DAYS Performed at [facility], [address] [place], [place] [address]    Report Status 03/12/2020 FINAL  Final  Blood culture (routine x 2)     Status: None   Collection Time: 03/27/2020  3:30 PM   Specimen: BLOOD  Result Value Ref Range Status   Specimen Description BLOOD BLOOD LEFT ARM  Final   Special Requests   Final    BOTTLES DRAWN AEROBIC AND ANAEROBIC Blood Culture adequate volume   Culture   Final    NO GROWTH 5 DAYS Performed at [facility], [address] [place], [place] [address]    Report Status 03/12/2020 FINAL  Final  MRSA PCR Screening     Status: None   Collection Time: 03/08/20  9:55 PM   Specimen: Nasal Mucosa; Nasopharyngeal  Result Value Ref Range Status   MRSA by PCR NEGATIVE NEGATIVE Final    Comment:        The GeneXpert MRSA Assay (FDA approved for NASAL specimens only), is one component of a comprehensive MRSA colonization surveillance program. It is not intended to diagnose MRSA infection nor to guide or monitor treatment for MRSA infections. Performed at [facility], [address] [place], [place] [address]      Coagulation Studies: No results for input(s): LABPROT, INR in the last 72 hours.  Urinalysis: No results for input(s): COLORURINE, LABSPEC, PHURINE, GLUCOSEU, HGBUR, BILIRUBINUR, KETONESUR, PROTEINUR, UROBILINOGEN, NITRITE, LEUKOCYTESUR in the last 72 hours.  Invalid input(s): APPERANCEUR      Imaging: DG Abd 1 View  Result Date: 03/15/2020 CLINICAL DATA:  Orogastric tube placement EXAM: ABDOMEN - 1 VIEW COMPARISON:  None. FINDINGS:  Tip and side port of the orogastric tube project within the stomach. The stomach is no longer distended with gas. IMPRESSION: Orogastric tube tip in the stomach. Electronically Signed   By: Ulyses Jarred M.D.   On: 03/15/2020 20:54   DG Chest Port 1 View  Result Date: 03/16/2020 CLINICAL DATA:  Endotracheal tube.  COVID positive. EXAM: PORTABLE CHEST 1 VIEW COMPARISON:  03/16/2020 FINDINGS: Endotracheal tube in good position. NG tube in the stomach. Right jugular central venous catheter tip innominate vein unchanged. Right arm PICC tip SVC unchanged. Severe diffuse bilateral airspace disease most prominent the right lung and left base. No pneumothorax. No effusion. No interval change. IMPRESSION: Severe bilateral airspace disease unchanged. Support tubes unchanged from earlier today. Electronically Signed   By: Franchot Gallo M.D.   On: 03/16/2020 17:41   DG Chest Port 1 View  Result Date: 03/16/2020 CLINICAL DATA:  Dialysis catheter insertion EXAM: PORTABLE CHEST 1 VIEW COMPARISON:  Portable exam 1155 hours compared 03/15/2020 FINDINGS: Tip of endotracheal tube projects 3.3 cm above carina. Nasogastric tube extends into stomach. RIGHT jugular catheter with tip projecting over SVC. RIGHT arm PICC line tip projecting over SVC. Normal heart size and mediastinal contours. Rotation to the LEFT. BILATERAL pulmonary infiltrates which could represent edema or infection. No pleural effusion or pneumothorax. IMPRESSION: No pneumothorax following RIGHT jugular line placement. Persistent BILATERAL pulmonary infiltrates question edema versus infection Electronically Signed   By: Lavonia Dana M.D.   On: 03/16/2020 14:02   ECHOCARDIOGRAM COMPLETE  Result Date: 03/16/2020    ECHOCARDIOGRAM REPORT   Patient Name:   Kathleen Espinoza Date of Exam: 03/15/2020  Medical Rec #:  [id number]      Height:       61.0 in Accession #:    [id number]     Weight:       220.5 lb Date of Birth:  27-Jan-1971       BSA:          1.969 m Patient  Age:    25 years       BP:           Not listed in chart/Not listed in                                              chart mmHg Patient Gender: F              HR:           Not listed in chart bpm. Exam Location:  [facility] Procedure: 2D Echo, Color Doppler and Cardiac Doppler Indications:     Shock  History:         Patient has no prior history of Echocardiogram examinations.                  Risk Factors:Hypertension.  Sonographer:     Sherrie Sport RDCS (AE) Referring Phys:  [id number] Bradly Bienenstock Diagnosing Phys: Yolonda Kida MD  Sonographer Comments: Technically difficult study due to poor echo windows, echo performed with patient supine and on artificial respirator and no apical window. IMPRESSIONS  1. Left ventricular ejection fraction, by estimation, is 55 to 60%. The left ventricle has normal function. The left ventricle has no regional wall motion abnormalities. Left ventricular diastolic parameters were normal.  2. Right ventricular systolic function is mildly reduced. The right ventricular size is mildly enlarged. There is moderately elevated pulmonary artery systolic pressure.  3. The mitral valve is grossly normal. Moderate mitral valve regurgitation.  4. Tricuspid valve regurgitation is mild to moderate.  5. The aortic valve is grossly normal. Aortic valve regurgitation is not visualized. FINDINGS  Left Ventricle: Left ventricular ejection fraction, by estimation, is 55 to 60%. The left ventricle has normal function. The left ventricle has no regional wall motion abnormalities. The left ventricular internal cavity size was normal in size. There is  no left ventricular hypertrophy. Left ventricular diastolic parameters were normal. Right Ventricle: The right ventricular size is mildly enlarged. No increase in right ventricular wall thickness. Right ventricular systolic function is mildly reduced. There is moderately elevated pulmonary artery systolic pressure. The tricuspid regurgitant velocity is 3.22  m/s, and with an assumed right atrial pressure of 10 mmHg, the estimated right ventricular systolic pressure is 00.4 mmHg. Left Atrium: Left atrial size was normal in size. Right Atrium: Right atrial size was normal in size. Pericardium: There is no evidence of pericardial effusion. Mitral Valve: The mitral valve is grossly normal. Moderate mitral valve regurgitation. Tricuspid Valve: The tricuspid valve is normal in structure. Tricuspid valve regurgitation is mild to moderate. Aortic Valve: The aortic valve is grossly normal. Aortic valve regurgitation is not visualized. Pulmonic Valve: The pulmonic valve was grossly normal. Pulmonic valve regurgitation is not visualized. Aorta: The aortic root is normal in size and structure. IAS/Shunts: No atrial level shunt detected by color flow Doppler.  LEFT VENTRICLE PLAX 2D LVIDd:         3.10 cm LVIDs:  2.21 cm LV PW:         1.27 cm LV IVS:        1.12 cm LVOT diam:     2.00 cm LVOT Area:     3.14 cm  LEFT ATRIUM         Index LA diam:    2.50 cm 1.27 cm/m                        PULMONIC VALVE AORTA                 PV Vmax:        0.60 m/s Ao Root diam: 2.30 cm PV Peak grad:   1.4 mmHg                       RVOT Peak grad: 1 mmHg  TRICUSPID VALVE TR Peak grad:   41.5 mmHg TR Vmax:        322.00 cm/s  SHUNTS Systemic Diam: 2.00 cm Dwayne Prince Rome MD Electronically signed by Yolonda Kida MD Signature Date/Time: 03/16/2020/5:32:27 PM    Final     MEDICATIONS  Scheduled Meds: . alteplase  2 mg Intracatheter Once  . artificial tears  1 application Both Eyes B8G  . vitamin C  500 mg Per Tube Daily  . chlorhexidine gluconate (MEDLINE KIT)  15 mL Mouth Rinse BID  . Chlorhexidine Gluconate Cloth  6 each Topical Daily  . docusate  100 mg Per Tube BID  . fentaNYL (SUBLIMAZE) injection  50 mcg Intravenous Once  . hydrocortisone sod succinate (SOLU-CORTEF) inj  50 mg Intravenous Q6H  . insulin aspart  0-20 Units Subcutaneous Q4H  . insulin detemir  10  Units Subcutaneous BID  . Ipratropium-Albuterol  1 puff Inhalation Q6H  . mouth rinse  15 mL Mouth Rinse BID  . mouth rinse  15 mL Mouth Rinse 10 times per day  . melatonin  2.5 mg Per Tube QHS  . multivitamin with minerals  1 tablet Per Tube Daily  . pantoprazole (PROTONIX) IV  40 mg Intravenous Q24H  . polyethylene glycol  17 g Per Tube Daily  . sodium chloride flush  10-40 mL Intracatheter Q12H  . sodium chloride flush  10-40 mL Intracatheter Q12H  . sodium chloride flush  3 mL Intravenous Q12H  . sterile water (preservative free)      . zinc sulfate  220 mg Per Tube Daily   Continuous Infusions: . argatroban    . cisatracurium (NIMBEX) infusion Stopped (03/16/20 1726)  . dextrose 30 mL/hr at Mar 19, 2020 0700  . fentaNYL infusion INTRAVENOUS 200 mcg/hr (Mar 19, 2020 0700)  . midazolam 5 mg/hr (2020-03-19 0700)  . norepinephrine (LEVOPHED) Adult infusion 40 mcg/min (03-19-20 0700)  . phenylephrine (NEO-SYNEPHRINE) Adult infusion 400 mcg/min (Mar 19, 2020 0700)  . prismasol BGK 2/2.5 dialysis solution 1,500 mL/hr at 03/16/20 1408  . prismasol BGK 2/2.5 replacement solution 300 mL/hr at 03/16/20 1408  . prismasol BGK 2/2.5 replacement solution 300 mL/hr at 03/16/20 1408  . sodium bicarbonate 150 mEq in dextrose 5% 1000 mL 125 mL/hr at 03/19/2020 0700  . vasopressin 0.04 Units/min (03/19/20 0700)   PRN Meds:.acetaminophen, atropine, chlorpheniramine-HYDROcodone, fentaNYL, guaiFENesin-dextromethorphan, heparin, loratadine, midazolam, morphine injection, ondansetron **OR** ondansetron (ZOFRAN) IV, sodium chloride, sodium chloride flush, sodium chloride flush, vecuronium  Assessment & Plan: Pt is a 49 y.o.   female with hypertension microscopic hematuria, history of thin basement membrane disease per chart, tonsillectomy, tubal  ligation, hyperlipidemia, new diagnosis of diabetes, was admitted on 02/29/2020 with Pneumonia due to COVID-19 virus [U07.1, J12.82]    #Acute kidney injury #Severe respiratory  failure, severe ARDS, multiorgan failure #COVID-19 pneumonia,  #Diabetes mellitus #Hyperkalemia #Severe Acidosis   Patient is critically ill and is doing poorly. Prognosis appears poor  Plan: CRRT Started on 03/16/2020 Continue  2K/2.5 calcium solution Increase dialysate and replacement fluid to optimize metabolic parameters Prefilter 500 cc/h Dialysate 2000 cc/h Post filter replacement 500 cc/h Net ultrafiltration 0   LOS: 10 Oberia Beaudoin 2021-09-058:58 AM    Note: This note was prepared with Dragon dictation. Any transcription errors are unintentional

## 2020-03-30 NOTE — Progress Notes (Signed)
CRRT high assess pressure-stopped and changed at the beginning of shift and re-started. Argatroban paused for 30 minutes and rate changed per APTT. Dr. Mortimer Fries notified of patient dilated and fixed pupils. Patient is mottled and cyanotic. Map is in the 40's, patient is on 3 pressors, maxed out per parameters. Dr. Mortimer Fries to call family. Continue to assess.

## 2020-03-30 NOTE — Progress Notes (Signed)
Notified DR. Kasa patients pupils are about an 8 and left one is now irregular. Orders to stop sedation. MAP remains in the 40's, he increased the levophed ceiling. Heart rate 120-130's. Continue to assess.

## 2020-03-30 NOTE — Death Summary Note (Signed)
DEATH SUMMARY   Patient Details  Name: Kathleen Espinoza MRN: [id number] DOB: 1971-01-28  Admission/Discharge Information   Admit Date:  March 26, 2020  Date of Death:   April 05, 2020   Time of Death:  11-08-26  Length of Stay: 85  Referring Physician: Einar Pheasant, MD   Reason(s) for Hospitalization  COVID 19 PNEUMONIA Diagnoses  Preliminary cause of death: COVID 47 PNEUMONIA, ISCHEMIC CARDIOMYOPATHY Secondary Diagnoses (including complications and co-morbidities):  Active Problems:   Pneumonia due to COVID-19 virus   Acute respiratory failure [facility])   Brief Hospital Course (including significant findings, care, treatment, and services provided and events leading to death)   Admitted for Severe resp failure,On biPAP, COVID 19 pneumonia 8/17 intubated for severe hypoxia 8/18 severe ARDS, Multiorgan failurePF ratio 11-08-74, ART line placed 04/06/23 severe ARDS multiorgan failure, on CRRT, multiple vasopressors  Acute hypoxemic respiratory failure due to COVID-19 pneumonia / ARDS Mechanical ventilation via ARDS protocol, target PRVC 6 cc/kg Wean PEEP and FiO2 as able Goal plateau pressure less than 30, driving pressure less than 15 Paralytics if necessary for vent synchrony, gas exchange Cycle prone positioning if necessary for oxygenation Deep sedation per PAD protocol, goal RASS -4, currentlyfentanyl, midazolam Diuresis as blood pressure and renal function can tolerate, goal CVP 5-8.  diuresis as tolerated based on Kidney function VAP prevention order set Remdesivir+ACTEMRA therapy IV steroids Follow inflammatory markers: Ferritin, D-dimer, CRP, IL-6, LDH Assess for possible tocilizumab  Vitamin C, zinc Plan to repeat and check resp cultures   Severe ACUTE Hypoxic and Hypercapnic Respiratory Failure -continue Full MV support -continue Bronchodilator Therapy -Wean Fio2 and PEEP as tolerated -VAP/VENT bundle implementation Severe hypoxia PF ratio 76  ACUTE DIASTOLIC CARDIAC  FAILURE-   Morbid obesity, possible OSA.  Will certainly impact respiratory mechanics, ventilator weaning   ACUTE KIDNEY INJURY/Renal Failure -continue Foley Catheter-assess need -Avoid nephrotoxic agents -Follow urine output, BMP -Ensure adequate renal perfusion, optimize oxygenation -Renal dose medications On CRRT    NEUROLOGY Acute toxic metabolic encephalopathy, need for sedation Goal RASS -2 to -3  SHOCK-SEPSIS/HYPOVOLUMIC -use  3 vasopressors to keep MAP>65 -follow ABG and LA -follow up cultures -emperic ABX -stress dose steroids   CARDIAC ICU monitoring      The Clinical status was relayed to family in detail.  Updated and notified of patients medical condition.   Patient with Progressive multiorgan failure with very low chance of meaningful recovery despite all aggressive and optimal medical therapy.  Patient is in the dying  Process associated with suffering.  Family understands the situation.  They have consented and agreed to DNR/DNI and would like to proceed with Comfort care measures.  Family are satisfied with Plan of action and management. All questions answered     Pertinent Labs and Studies  Significant Diagnostic Studies DG Chest 2 View  Result Date: 03-26-2020 CLINICAL DATA:  Shortness of breath and weakness, history of COVID-19 positivity EXAM: CHEST - 2 VIEW COMPARISON:  10/06/2015 FINDINGS: Cardiac shadow is within normal limits. The overall inspiratory effort is poor. Patchy airspace opacities are identified throughout both lungs. No sizable effusion is noted. No bony abnormality is seen. IMPRESSION: Diffuse patchy multifocal pneumonia consistent with the given clinical history of COVID-19 positivity. Electronically Signed   By: Inez Catalina M.D.   On: 2020-03-26 08:47   DG Abd 1 View  Result Date: 03/15/2020 CLINICAL DATA:  Orogastric tube placement EXAM: ABDOMEN - 1 VIEW COMPARISON:  None. FINDINGS: Tip and side port  of the orogastric tube project  within the stomach. The stomach is no longer distended with gas. IMPRESSION: Orogastric tube tip in the stomach. Electronically Signed   By: Ulyses Jarred M.D.   On: 03/15/2020 20:54   DG Abd 1 View  Result Date: 03/15/2020 CLINICAL DATA:  Orogastric tube placement EXAM: ABDOMEN - 1 VIEW COMPARISON:  None. FINDINGS: Orogastric tube tip and side port project over the gas distended stomach. IMPRESSION: OG tube tip and side port project within the stomach. Electronically Signed   By: Ulyses Jarred M.D.   On: 03/15/2020 02:52   CT ANGIO CHEST PE W OR WO CONTRAST  Result Date: 03/08/2020 CLINICAL DATA:  Shortness of breath with positive D-dimer study. EXAM: CT ANGIOGRAPHY CHEST WITH CONTRAST TECHNIQUE: Multidetector CT imaging of the chest was performed using the standard protocol during bolus administration of intravenous contrast. Multiplanar CT image reconstructions and MIPs were obtained to evaluate the vascular anatomy. CONTRAST:  81mL OMNIPAQUE IOHEXOL 350 MG/ML SOLN COMPARISON:  March 07, 2020 FINDINGS: Cardiovascular: There is no demonstrable pulmonary embolus. There is no thoracic aortic aneurysm or dissection. The visualized great vessels appear unremarkable. The right innominate and left common carotid arteries arise as a common trunk, an anatomic variant. No pericardial effusion or pericardial thickening evident. Mediastinum/Nodes: There is an 8 mm nodular opacity in the right lobe of the thyroid which per consensus guidelines does not warrant additional imaging surveillance. There are occasional subcentimeter mediastinal lymph nodes. There is a lymph node to the right of the distal trachea measuring 1.2 x 1.1 cm. There is a lymph node in the aortopulmonary window region measuring 1.1 x 1.1 cm. No esophageal lesions are evident. Lungs/Pleura: There is widespread airspace opacity throughout the lungs diffusely involving to varying degrees all lobes and segments. No  pleural effusions evident. Upper Abdomen: Visualized upper abdominal structures appear unremarkable. Musculoskeletal: No blastic or lytic bone lesions. No evident chest wall lesions. Review of the MIP images confirms the above findings. IMPRESSION: 1. No demonstrable pulmonary embolus. No thoracic aortic aneurysm or dissection. 2. Widespread airspace opacity bilaterally consistent with multifocal pneumonia throughout the lungs. Suspect atypical organism pneumonia. 3. Mildly prominent right pretracheal and aortopulmonary window lymph nodes, likely of reactive etiology given the extensive parenchymal lung abnormalities. Electronically Signed   By: Lowella Grip III M.D.   On: 03/08/2020 14:11   DG Chest Port 1 View  Result Date: 03/16/2020 CLINICAL DATA:  Endotracheal tube.  COVID positive. EXAM: PORTABLE CHEST 1 VIEW COMPARISON:  03/16/2020 FINDINGS: Endotracheal tube in good position. NG tube in the stomach. Right jugular central venous catheter tip innominate vein unchanged. Right arm PICC tip SVC unchanged. Severe diffuse bilateral airspace disease most prominent the right lung and left base. No pneumothorax. No effusion. No interval change. IMPRESSION: Severe bilateral airspace disease unchanged. Support tubes unchanged from earlier today. Electronically Signed   By: Franchot Gallo M.D.   On: 03/16/2020 17:41   DG Chest Port 1 View  Result Date: 03/16/2020 CLINICAL DATA:  Dialysis catheter insertion EXAM: PORTABLE CHEST 1 VIEW COMPARISON:  Portable exam 1155 hours compared 03/15/2020 FINDINGS: Tip of endotracheal tube projects 3.3 cm above carina. Nasogastric tube extends into stomach. RIGHT jugular catheter with tip projecting over SVC. RIGHT arm PICC line tip projecting over SVC. Normal heart size and mediastinal contours. Rotation to the LEFT. BILATERAL pulmonary infiltrates which could represent edema or infection. No pleural effusion or pneumothorax. IMPRESSION: No pneumothorax following RIGHT  jugular line placement. Persistent BILATERAL pulmonary infiltrates question edema versus infection Electronically  Signed   By: Lavonia Dana M.D.   On: 03/16/2020 14:02   Portable Chest x-ray  Result Date: 03/15/2020 CLINICAL DATA:  Intubation EXAM: PORTABLE CHEST 1 VIEW COMPARISON:  None. FINDINGS: Support Apparatus: --Endotracheal tube: Tip just below the level of the clavicular heads. --Enteric tube:Tip and sideport are below the field of view. --Catheter(s):None --Other: None Cardiomediastinal contours are normal. There is moderate interstitial opacity, right greater than left. IMPRESSION: 1. Endotracheal tube tip just below the level of the clavicular heads. 2. Right greater than left interstitial opacities, which may indicate pulmonary edema or infection. Electronically Signed   By: Ulyses Jarred M.D.   On: 03/15/2020 02:51   DG Chest Port 1 View  Result Date: 03/14/2020 CLINICAL DATA:  COVID pneumonia. EXAM: PORTABLE CHEST 1 VIEW COMPARISON:  03/14/2019 FINDINGS: The cardiac silhouette, mediastinal and hilar contours are stable. Persistent diffuse interstitial and airspace process in the lungs. No pleural effusion or pneumothorax. IMPRESSION: Persistent diffuse interstitial and airspace process. Electronically Signed   By: Marijo Sanes M.D.   On: 03/14/2020 07:54   DG Chest Port 1 View  Result Date: 03/13/2020 CLINICAL DATA:  Follow-up bilateral airspace opacities EXAM: PORTABLE CHEST 1 VIEW COMPARISON:  03/12/2020 FINDINGS: Cardiac shadow is stable. Diffuse airspace opacities are again identified bilaterally consistent with the given clinical history of COVID-19 positivity. No bony abnormality is noted. IMPRESSION: No change when compare with the prior exam. Electronically Signed   By: Inez Catalina M.D.   On: 03/13/2020 05:47   DG Chest Port 1 View  Result Date: 03/12/2020 CLINICAL DATA:  Acute respiratory failure EXAM: PORTABLE CHEST 1 VIEW COMPARISON:  03/11/2020 FINDINGS: Cardiac shadow is  stable. Diffuse airspace opacities are again noted and stable. No new focal infiltrate or effusion is seen. No pneumothorax is noted. IMPRESSION: Stable airspace opacities bilaterally similar to that seen on the prior exam. Electronically Signed   By: Inez Catalina M.D.   On: 03/12/2020 06:32   DG Chest Port 1 View  Result Date: 03/11/2020 CLINICAL DATA:  Follow-up pneumonia EXAM: PORTABLE CHEST 1 VIEW COMPARISON:  03/10/2020 FINDINGS: Cardiac shadow is stable. Lungs are well aerated bilaterally. Diffuse multifocal airspace opacities are again identified stable from the prior exam. No bony abnormality is noted. IMPRESSION: No change from the previous day. Electronically Signed   By: Inez Catalina M.D.   On: 03/11/2020 08:06   DG Chest Port 1 View  Result Date: 03/10/2020 CLINICAL DATA:  Pneumonia. EXAM: PORTABLE CHEST 1 VIEW COMPARISON:  03/05/2020 FINDINGS: Lungs are somewhat hypoinflated demonstrate interval worsening bilateral patchy airspace process likely multifocal pneumonia. No evidence of effusion. Cardiomediastinal silhouette and remainder the exam is unchanged. IMPRESSION: Interval worsening bilateral airspace process likely multifocal pneumonia. Electronically Signed   By: Marin Olp M.D.   On: 03/10/2020 08:04   ECHOCARDIOGRAM COMPLETE  Result Date: 03/16/2020    ECHOCARDIOGRAM REPORT   Patient Name:   Kathleen Espinoza Date of Exam: 03/15/2020 Medical Rec #:  [id number]      Height:       61.0 in Accession #:    [id number]     Weight:       220.5 lb Date of Birth:  1970/10/22       BSA:          1.969 m Patient Age:    49 years       BP:           Not listed in chart/Not listed in  chart mmHg Patient Gender: F              HR:           Not listed in chart bpm. Exam Location:  [facility] Procedure: 2D Echo, Color Doppler and Cardiac Doppler Indications:     Shock  History:         Patient has no prior history of Echocardiogram examinations.                   Risk Factors:Hypertension.  Sonographer:     Sherrie Sport RDCS (AE) Referring Phys:  [id number] Bradly Bienenstock Diagnosing Phys: Yolonda Kida MD  Sonographer Comments: Technically difficult study due to poor echo windows, echo performed with patient supine and on artificial respirator and no apical window. IMPRESSIONS  1. Left ventricular ejection fraction, by estimation, is 55 to 60%. The left ventricle has normal function. The left ventricle has no regional wall motion abnormalities. Left ventricular diastolic parameters were normal.  2. Right ventricular systolic function is mildly reduced. The right ventricular size is mildly enlarged. There is moderately elevated pulmonary artery systolic pressure.  3. The mitral valve is grossly normal. Moderate mitral valve regurgitation.  4. Tricuspid valve regurgitation is mild to moderate.  5. The aortic valve is grossly normal. Aortic valve regurgitation is not visualized. FINDINGS  Left Ventricle: Left ventricular ejection fraction, by estimation, is 55 to 60%. The left ventricle has normal function. The left ventricle has no regional wall motion abnormalities. The left ventricular internal cavity size was normal in size. There is  no left ventricular hypertrophy. Left ventricular diastolic parameters were normal. Right Ventricle: The right ventricular size is mildly enlarged. No increase in right ventricular wall thickness. Right ventricular systolic function is mildly reduced. There is moderately elevated pulmonary artery systolic pressure. The tricuspid regurgitant velocity is 3.22 m/s, and with an assumed right atrial pressure of 10 mmHg, the estimated right ventricular systolic pressure is 09.7 mmHg. Left Atrium: Left atrial size was normal in size. Right Atrium: Right atrial size was normal in size. Pericardium: There is no evidence of pericardial effusion. Mitral Valve: The mitral valve is grossly normal. Moderate mitral valve regurgitation. Tricuspid Valve: The  tricuspid valve is normal in structure. Tricuspid valve regurgitation is mild to moderate. Aortic Valve: The aortic valve is grossly normal. Aortic valve regurgitation is not visualized. Pulmonic Valve: The pulmonic valve was grossly normal. Pulmonic valve regurgitation is not visualized. Aorta: The aortic root is normal in size and structure. IAS/Shunts: No atrial level shunt detected by color flow Doppler.  LEFT VENTRICLE PLAX 2D LVIDd:         3.10 cm LVIDs:         2.21 cm LV PW:         1.27 cm LV IVS:        1.12 cm LVOT diam:     2.00 cm LVOT Area:     3.14 cm  LEFT ATRIUM         Index LA diam:    2.50 cm 1.27 cm/m                        PULMONIC VALVE AORTA                 PV Vmax:        0.60 m/s Ao Root diam: 2.30 cm PV Peak grad:   1.4 mmHg  RVOT Peak grad: 1 mmHg  TRICUSPID VALVE TR Peak grad:   41.5 mmHg TR Vmax:        322.00 cm/s  SHUNTS Systemic Diam: 2.00 cm Yolonda Kida MD Electronically signed by Yolonda Kida MD Signature Date/Time: 03/16/2020/5:32:27 PM    Final    [place] EKG SITE RITE  Result Date: 03/14/2020 If Site Rite image not attached, placement could not be confirmed due to current cardiac rhythm.  US Abdomen Limited RUQ  Result Date: 03/13/2020 CLINICAL DATA:  Concern for cholecystitis. EXAM: ULTRASOUND ABDOMEN LIMITED RIGHT UPPER QUADRANT COMPARISON:  CT abdomen pelvis dated 09/14/2017. FINDINGS: Gallbladder: No gallstones or wall thickening visualized. No sonographic Murphy sign noted by sonographer. Common bile duct: Diameter: 4 mm Liver: No focal lesion identified. Increased parenchymal echogenicity. Portal vein is patent on color Doppler imaging with normal direction of blood flow towards the liver. Other: None. IMPRESSION: 1.  No sonographic evidence of acute cholecystitis. 2.  Increased liver echogenicity likely reflects hepatic steatosis. Electronically Signed   By: Zerita Boers M.D.   On: 03/13/2020 12:41    Microbiology Recent Results  (from the past 240 hour(s))  MRSA PCR Screening     Status: None   Collection Time: 03/08/20  9:55 PM   Specimen: Nasal Mucosa; Nasopharyngeal  Result Value Ref Range Status   MRSA by PCR NEGATIVE NEGATIVE Final    Comment:        The GeneXpert MRSA Assay (FDA approved for NASAL specimens only), is one component of a comprehensive MRSA colonization surveillance program. It is not intended to diagnose MRSA infection nor to guide or monitor treatment for MRSA infections. Performed at [facility], [address] [place], [place] [address]     Lab Basic Metabolic Panel: Recent Labs  Lab 03/11/20 0136 03/11/20 0136 03/12/20 0529 03/12/20 0529 03/13/20 1056 03/14/20 0815 03/16/20 0400 03/16/20 0550 03/16/20 0942 03/16/20 1550 18-Mar-2020 0350  NA 146*   < > 143   < > 145   < > 146* 147* 145 144 143  K 4.5   < > 3.7   < > 3.3*   < > 5.6* 5.8* 5.3* 5.5* 5.1  CL 105   < > 101   < > 102   < > 112* 110 107 106 109  CO2 30   < > 29   < > 29   < > 21* 21* 22 21* 15*  GLUCOSE 223*   < > 245*   < > 151*   < > 214* 214* 215* 246* 39*  BUN 27*   < > 24*   < > 24*   < > 44* 46* 51* 52* 31*  CREATININE 0.84   < > 0.67   < > 0.72   < > 3.27* 3.67* 4.47* 4.35* 3.17*  CALCIUM 8.0*   < > 7.7*   < > 7.8*   < > 6.4* 7.0* 7.3* 7.1* 7.1*  MG 2.4  --  2.3  --  2.3  --   --   --   --   --  2.4  PHOS 4.4  --  4.7*  --  3.2  --   --   --   --  9.6* 8.8*   < > = values in this interval not displayed.   Liver Function Tests: Recent Labs  Lab 03/14/20 0815 03/14/20 0815 03/15/20 0500 03/16/20 0400 03/16/20 0550 03/16/20 1550 Mar 18, 2020 0350  AST 34  --  42*  462* 644*  --  6,218*  ALT 20  --  22 292* 431*  --  3,410*  ALKPHOS 187*  --  216* 150* 149*  --  269*  BILITOT 0.9  --  0.6 0.8 0.7  --  1.7*  PROT 6.1*  --  6.3* 5.0* 5.3*  --  5.1*  ALBUMIN 2.9*   < > 3.0* 2.4* 2.6* 2.5* 2.4*   < > = values in this interval not displayed.   No results for input(s): LIPASE, AMYLASE in the  last 168 hours. No results for input(s): AMMONIA in the last 168 hours. CBC: Recent Labs  Lab 03/11/20 0136 03/11/20 0136 03/12/20 0529 03/12/20 0529 03/13/20 1056 03/13/20 1056 03/14/20 0815 03/15/20 0500 03/16/20 0400 03/16/20 0550 March 23, 2020 0350  WBC 15.4*   < > 19.1*   < > 15.4*   < > 11.3* 37.0* 33.7* 35.1* 37.7*  NEUTROABS 14.0*  --  17.5*  --  14.0*  --   --   --   --  29.5*  --   HGB 11.4*   < > 11.5*   < > 12.6   < > 11.7* 12.5 11.1* 11.5* 9.8*  HCT 34.7*   < > 35.6*   < > 38.4   < > 37.6 41.7 36.4 38.6 34.1*  MCV 78.0*   < > 78.1*   < > 77.7*   < > 80.9 86.5 85.2 86.2 90.9  PLT 410*   < > 421*   < > 437*   < > 393 143* 82* 80* 55*   < > = values in this interval not displayed.   Cardiac Enzymes: No results for input(s): CKTOTAL, CKMB, CKMBINDEX, TROPONINI in the last 168 hours. Sepsis Labs: Recent Labs  Lab 03/12/20 1024 03/13/20 1056 03/15/20 0500 03/16/20 0400 03/16/20 0500 03/16/20 0550 2020-03-23 0350  PROCALCITON  --   --   --   --  7.95  --   --   WBC  --    < > 37.0* 33.7*  --  35.1* 37.7*  LATICACIDVEN 1.9  --   --   --   --   --   --    < > = values in this interval not displayed.      Flora Lipps 03/23/2020, 5:34 PM

## 2020-03-30 NOTE — Consult Note (Signed)
Consultation Note Date: 04-06-2020   Patient Name: Kathleen Espinoza  DOB: 1970-11-27  MRN: [id number]  Age / Sex: 49 y.o., female   PCP: Einar Pheasant, MD Referring Physician: Flora Lipps, MD   REASON FOR CONSULTATION:Establishing goals of care  Palliative Care consult requested for goals of care discussion in this 49 y.o. female with medical problems including hypertension and hyperlipidemia. Patient presented to ED from home with complaints of generalized weakness and shortness of breath in the setting of COVID-19 positive. Patient required intubation with maximum pressors due to hypotension and respiratory failure.   Clinical Assessment and Goals of Care: I have reviewed medical records including lab results, imaging, Epic notes, and MAR, received report from the bedside RN, and assessed the patient. I met at the bedside with patient's husband, Ariane Ditullio and their daughter Thornell Mule  to discuss diagnosis prognosis, GOC, EOL wishes, disposition and options.  I introduced Palliative Medicine as specialized medical care for people living with serious illness. It focuses on providing relief from the symptoms and stress of a serious illness. The goal is to improve quality of life for both the patient and the family. Family verbalized understanding and appreciation.   We discussed a brief life review of the patient, along with her functional and nutritional status. Husband reports he and patient have been married for about 15 years. She has 2 daughters (25, 28). She has 2 grandchildren. Zyah works at [employer].   Prior to admission patient had a great quality of life. Family reports patient experiencing cough, congestion, and shortness of breath over the past 1-2 weeks after learning she was positive with COVID.   We discussed Her current illness and what it means in the larger context of Her on-going care needs. With specific discussions regarding respiratory failure, hypotension, and  other mulit-organ failures. Natural disease trajectory and expectations at EOL were discussed.  Husband and daughter extremely tearful in discussion. Education provided and detailed updates regarding current care being offered. Education provided on current vital signs and lab work. Husband verbalized understanding.   He shared patient has been sick for several days and he knew she was worsening however, she continued to refuse seeking medical care. He shares she finally became severely weak and with worsening shortness of breath leading her to agree to come to local ED.   I attempted to elicit values and goals of care important to the patient.    The difference between aggressive medical intervention and comfort care was considered in light of the patient's goals of care. Husband states he respects the medical team, however he relies on his strong faith in [place]. He continues to share that he feels patient can "pull through" with continued care and God's intervention. 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   Mr. Surman shares that God's interventions will occur regardless if they make any decisions towards comfort or continued care. Therapeutic listening and support provided.   I again expressed concerns and risk of patient's sudden death. I encouraged family to consider patient's quality of life, poor prognosis, and significant risk of death suddenly due to hypotension and multi organ failure. Encouraged family to consider if patient was able to express her wishes would she wish to continue having such aggressive measures done to her in her current state of health. Husband tearfully verbalized understanding and  appreciation. He inquired about what comfort care would look like for patient. Detailed education provided on compassionate wean (one-way extubation) and  allowing patient to be comfortable and pass away peacefully. Mr. Samet verbalized understanding and expressed they are still relying on God's intervention, however thankful for the education worst case scenario.   We discussed patient's full codes status with consideration of her current illness. Family is requesting patient remain full code at this time.   Questions and concerns were addressed. The family was encouraged to call with questions or concerns.  PMT will continue to support holistically.   SOCIAL HISTORY:     reports that she has never smoked. She has never used smokeless tobacco. She reports that she does not drink alcohol and does not use drugs.  CODE STATUS: Full code  ADVANCE DIRECTIVES: Primary Decision Maker: Havyn Ramo (husband)    SYMPTOM MANAGEMENT: per attending   Palliative Prophylaxis:   Aspiration, Frequent Pain Assessment and Oral Care  PSYCHO-SOCIAL/SPIRITUAL:  Support System: Family  Desire for further Chaplaincy support: Yes   Additional Recommendations (Limitations, Scope, Preferences):  Full Scope Treatment  Education on comfort    PAST MEDICAL HISTORY: Past Medical History:  Diagnosis Date  . Abnormal pap    required freezing  . Hypercholesterolemia   . Hypertension   . Microscopic hematuria    s/p renal biopsy  . Thin basement membrane disease     ALLERGIES:  is allergic to norvasc [amlodipine besylate], betadine [povidone iodine], and iodine.   MEDICATIONS:  Current Facility-Administered Medications  Medication Dose Route Frequency Provider Last Rate Last Admin  . acetaminophen (TYLENOL) tablet 650 mg  650 mg Per Tube Q6H PRN Flora Lipps, MD      . alteplase (CATHFLO ACTIVASE) injection 2 mg  2 mg Intracatheter Once Flora Lipps, MD      . argatroban 1 mg/mL infusion  0.08 mcg/kg/min Intravenous Continuous Flora Lipps, MD 0.48 mL/hr at 2020/03/24 1300 0.08 mcg/kg/min at 2020-03-24 1300  . artificial tears (LACRILUBE) ophthalmic  ointment 1 application  1 application Both Eyes I7O Flora Lipps, MD   1 application at 67/67/20 1247  . ascorbic acid (VITAMIN C) tablet 500 mg  500 mg Per Tube Daily Flora Lipps, MD   500 mg at 2020-03-24 1027  . atropine 1 MG/10ML injection 1 mg  1 mg Intravenous PRN Awilda Bill, NP      . chlorhexidine gluconate (MEDLINE KIT) (PERIDEX) 0.12 % solution 15 mL  15 mL Mouth Rinse BID Darel Hong D, NP   15 mL at Mar 24, 2020 0745  . Chlorhexidine Gluconate Cloth 2 % PADS 6 each  6 each Topical Daily Awilda Bill, NP   6 each at 03/16/20 0913  . chlorpheniramine-HYDROcodone (TUSSIONEX) 10-8 MG/5ML suspension 5 mL  5 mL Per Tube Q12H PRN Flora Lipps, MD      . cisatracurium (NIMBEX) 200 mg in sodium chloride 0.9 % 200 mL (1 mg/mL) infusion  0-10 mcg/kg/min Intravenous Titrated Flora Lipps, MD   Held at 03/16/20 1726  . dextrose 10 % infusion   Intravenous Continuous Awilda Bill, NP 30 mL/hr at 03/24/2020 1300 Rate Verify at 03/24/2020 1300  . docusate (COLACE) 50 MG/5ML liquid 100 mg  100 mg Per Tube BID Flora Lipps, MD   100 mg at 03-24-2020 0903  . fentaNYL (SUBLIMAZE) bolus via infusion 50 mcg  50 mcg Intravenous Q15 min PRN Flora Lipps, MD      . fentaNYL (SUBLIMAZE) injection 50 mcg  50  mcg Intravenous Once Flora Lipps, MD      . fentaNYL 2571mg in NS 2546m(1057mml) infusion-PREMIX  50-300 mcg/hr Intravenous Continuous KasFlora LippsD   Stopped at 08/September 15, 202144  . guaiFENesin-dextromethorphan (ROBITUSSIN DM) 100-10 MG/5ML syrup 10 mL  10 mL Per Tube Q4H PRN KasFlora LippsD      . heparin injection 1,000-6,000 Units  1,000-6,000 Units CRRT PRN SinMurlean IbaD      . hydrocortisone sodium succinate (SOLU-CORTEF) 100 MG injection 50 mg  50 mg Intravenous Q6H KasFlora LippsD   50 mg at 02/2020-03-1502  . hydroxychloroquine (PLAQUENIL) tablet 400 mg  400 mg Per Tube BID KasFlora LippsD   400 mg at 08/09-15-2127   Followed by  . [START ON 03/18/2020] hydroxychloroquine (PLAQUENIL)  tablet 200 mg  200 mg Per Tube BID KasFlora LippsD      . insulin aspart (novoLOG) injection 0-15 Units  0-15 Units Subcutaneous Q4H KasFlora LippsD      . Ipratropium-Albuterol (COMBIVENT) respimat 1 puff  1 puff Inhalation Q6H AmiLorella NimrodD   1 puff at 03/14/20 1709  . loratadine (CLARITIN) tablet 10 mg  10 mg Per Tube Daily PRN KasFlora LippsD      . MEDLINE mouth rinse  15 mL Mouth Rinse BID AmiLorella NimrodD   15 mL at 03/16/20 0912  . MEDLINE mouth rinse  15 mL Mouth Rinse 10 times per day KeeBradly BienenstockP   15 mL at 03/07/14/2149  . melatonin tablet 2.5 mg  2.5 mg Per Tube QHS KasFlora LippsD      . midazolam (VERSED) 50 mg/50 mL (1 mg/mL) premix infusion  2-10 mg/hr Intravenous Continuous KasFlora LippsD   Stopped at 08/September 15, 202144  . midazolam (VERSED) bolus via infusion 1-2 mg  1-2 mg Intravenous Q2H PRN KasFlora LippsD      . morphine 2 MG/ML injection 2 mg  2 mg Intravenous Q3H PRN Tukov-Yual, Magdalene S, NP   2 mg at 03/14/20 2210  . multivitamin with minerals tablet 1 tablet  1 tablet Per Tube Daily KasFlora LippsD   1 tablet at 08/September 15, 202103  . norepinephrine (LEVOPHED) 16 mg in 250m57memix infusion  0-200 mcg/min Intravenous Titrated KasaFlora Lipps 187.5 mL/hr at 08/1September 15, 20212 200 mcg/min at 08/109-15-20212  . ondansetron (ZOFRAN) tablet 4 mg  4 mg Oral Q6H PRN AminLorella Nimrod       Or  . ondansetron (ZOFRAN) injection 4 mg  4 mg Intravenous Q6H PRN AminLorella Nimrod   4 mg at 03/12/20 0101  . pantoprazole (PROTONIX) injection 40 mg  40 mg Intravenous Q24H BlakAwilda Bill   40 mg at 08/109-15-20213  . phenylephrine CONCENTRATED 100mg36msodium chloride 0.9% 250mL 68mmg/mL63mnfusion  0-400 mcg/min Intravenous Titrated Keene, Darel Hong60 mL/hr at 08/19/209-15-202100 mcg/min at 08/19/2September 15, 2021. polyethylene glycol (MIRALAX / GLYCOLAX) packet 17 g  17 g Per Tube Daily Kasa, KFlora Lipps17 g at 08/19/22021/09/15. prismasol BGK 2/2.5 dialysis solution   CRRT  Continuous Singh, Murlean Iba000 mL/hr at 08/19/215-Sep-2021ew Bag at 08/19/22021-09-15. prismasol BGK 2/2.5 replacement solution   CRRT Continuous Singh, Murlean Iba0 mL/hr at 08/19/22021/09/15ate Change at 08/19/22021/09/15. prismasol BGK 2/2.5 replacement solution   CRRT Continuous Singh, Murlean Iba0 mL/hr at 08/19/2Sep 15, 2021ate Change  at Mar 30, 2020 0944  . sodium bicarbonate 150 mEq in dextrose 5% 1000 mL infusion  150 mEq Intravenous Continuous Flora Lipps, MD 200 mL/hr at 03-30-20 1421 150 mEq at 03/30/2020 1421  . sodium chloride 0.9 % primer fluid for CRRT   CRRT PRN Murlean Iba, MD   Given at 03/16/20 1411  . sodium chloride flush (NS) 0.9 % injection 10-40 mL  10-40 mL Intracatheter Q12H Lorella Nimrod, MD   10 mL at 03/16/20 2136  . sodium chloride flush (NS) 0.9 % injection 10-40 mL  10-40 mL Intracatheter PRN Lorella Nimrod, MD      . sodium chloride flush (NS) 0.9 % injection 10-40 mL  10-40 mL Intracatheter Q12H Flora Lipps, MD   10 mL at 03/16/20 2100  . sodium chloride flush (NS) 0.9 % injection 10-40 mL  10-40 mL Intracatheter PRN Flora Lipps, MD   10 mL at 03/14/20 2151  . sodium chloride flush (NS) 0.9 % injection 3 mL  3 mL Intravenous Q12H Lorella Nimrod, MD   3 mL at 03/16/20 2100  . vasopressin (PITRESSIN) 20 Units in sodium chloride 0.9 % 100 mL infusion-*FOR SHOCK*  0-0.04 Units/min Intravenous Continuous Awilda Bill, NP 12 mL/hr at 03-30-2020 1300 0.04 Units/min at March 30, 2020 1300  . vecuronium (NORCURON) injection 10 mg  10 mg Intravenous Q2H PRN Darel Hong D, NP   10 mg at 03-30-20 0340  . zinc sulfate capsule 220 mg  220 mg Per Tube Daily Flora Lipps, MD   220 mg at 03-30-20 1027    VITAL SIGNS: BP (!) 90/24   Pulse (!) 29   Temp 98.1 F (36.7 C)   Resp (!) 30   Ht 5' 1"  (1.549 m)   Wt 105.6 kg   LMP 03/15/2020   SpO2 91%   BMI 43.99 kg/m  Filed Weights   03/15/20 0406 03/16/20 0159 03-30-20 0500  Weight: 100 kg 100.5 kg 105.6 kg    Estimated body  mass index is 43.99 kg/m as calculated from the following:   Height as of this encounter: 5' 1"  (1.549 m).   Weight as of this encounter: 105.6 kg.  LABS: CBC:    Component Value Date/Time   WBC 37.7 (H) Mar 30, 2020 0350   HGB 9.8 (L) 30-Mar-2020 0350   HGB 13.3 10/02/2018 0826   HCT 34.1 (L) March 30, 2020 0350   HCT 37.3 10/02/2018 0826   PLT 55 (L) Mar 30, 2020 0350   PLT 330 10/02/2018 0826   Comprehensive Metabolic Panel:    Component Value Date/Time   NA 143 03-30-2020 0350   NA 139 10/02/2018 0826   K 5.1 March 30, 2020 0350   BUN 31 (H) March 30, 2020 0350   BUN 14 10/02/2018 0826   CREATININE 3.17 (H) 30-Mar-2020 0350   ALBUMIN 2.4 (L) 03-30-2020 0350   ALBUMIN 3.9 10/02/2018 0826     Review of Systems  Unable to perform ROS: Intubated   Physical Exam General: intubated, frail -ill appearing Cardiovascular:tachycardic, severely hypotensive Extremities: edematous generalized, cyanotic Skin: cool to touch  Neurological: unresponsive, pupils unequal   Prognosis: Hours - Days high risk of sudden death   Discharge Planning:  Anticipated Hospital Death  Recommendations:  Full Code-as confirmed by husband  . Continue with current plan of care per medical team, full scope/full aggressive care . Family continues to request full scope/full aggressive interventions. Remains hopeful patient will recover. Husband continues to speak to strong faith beliefs and patient recovering based on God's intervention, despite open and honest discussion  regarding patient's poor prognosis. Does ask appropriate questions regarding EOL however continues to express wishes of not being prepared to make such decisions.  . Encouraged family to continue ongoing discussions focusing on patient's quality of life as they make decisions.   Marland Kitchen PMT will continue to support and follow. Please call team line with urgent needs.   Palliative Performance Scale: INTUBATED               Husband and daughter,  expressed understanding and was in agreement with this plan.   Thank you for allowing the Palliative Medicine Team to assist in the care of this patient.  Time In: 1440 Time Out: 1530 Time Total: 50 min.   Visit consisted of counseling and education dealing with the complex and emotionally intense issues of symptom management and palliative care in the setting of serious and potentially life-threatening illness.Greater than 50%  of this time was spent counseling and coordinating care related to the above assessment and plan.  Signed by:  Alda Lea, AGPCNP-BC Palliative Medicine Team  Phone: [phone] Pager: [phone] Amion: Bjorn Pippin

## 2020-03-30 NOTE — Progress Notes (Signed)
Pharmacy Heparin Induced Thrombocytopenia (HIT) Note:  Kathleen Espinoza is an 49 y.o. 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 Patient was on therapeutic Lovenox, which was subsequently decreased to VTE prophylaxis dosing. Patient with > 50% drop in platelets.  HIT labs were ordered on 8/18 when platelets dropped to 80.  Patient started on CRRT 8/18. Filter clotting off after start. Will start argatroban rather than heparin drip with concern for drop in platelets.    Goal APTT: 50-90 sec  Plan: Start argatroban infusion at 0.5 mcg/kg/min. Will plan to check APTT every 2 hours for now. Once therapeutic x 2, will check daily. CBC with morning labs.   8/18 @ 2350 aPTT > 160. Supratherapeutic. Confirmed with nurse no bleeding concerns and level was not drawn near the site of the infusion. D/w nurse, will hold infusion x 8min then reduce rate by 50% (0.17 mcg/kg/min) and informed nurse of changes. Will check aPTT in 2 hours.   Ena Dawley, PharmD 04/03/2020, 3:49 AM

## 2020-03-30 NOTE — Progress Notes (Signed)
The Clinical status was relayed to family in detail. Husband, Daughter at bedside  Updated and notified of patients medical condition.  Patient remains unresponsive and will not open eyes to command.    patient with increased WOB and using accessory muscles to breathe Explained to family course of therapy and the modalities     Patient with Progressive multiorgan failure with very low chance of meaningful recovery despite all aggressive and optimal medical therapy.  Patient is in the dying  Process associated with suffering.  Family understands the situation.  They have consented and agreed to DNR/DNI and would like to proceed with Comfort care measures.  Family are satisfied with Plan of action and management. All questions answered   Corrin Parker, M.D.  [facility]  Medical Director [place] Director [facility] Cardio-Pulmonary Department

## 2020-03-30 NOTE — Progress Notes (Signed)
Unable to palpate or doppler pulses in hands or feet. Femoral pulse is able to doppler bilaterally.  Limbs are cold to touch and fingertip/toes are blue. Provider is aware. Instructed to monitor and increase temp on CRRT.

## 2020-03-30 NOTE — Progress Notes (Addendum)
Notified pts husband Jurnee Nakayama regarding continued decline in pts condition despite aggressive treatment (pt requiring maximal doses of 3 vasopressors, remains on CRRT, and on maximal ventilator support).  Informed Mr. Khalsa pt at [facility] for cardiac arrest and sudden death.  I discussed code status with Mr. Stevick and at this time pt to remain Full Code.  All questions were answered will continue to monitor and assess pt.  Marda Stalker, [place] Pager [phone] (please enter 7 digits) PCCM Consult Pager [phone] (please enter 7 digits)

## 2020-03-30 NOTE — Progress Notes (Signed)
CRITICAL CARE NOTE Admitted for Severe resp failure,On biPAP, COVID 19 pneumonia 8/17 intubated for severe hypoxia 8/18 severe ARDS, Multiorgan failure PF ratio 76, ART line placed 8/19 severe ARDS multiorgan failure, on CRRT, multiple vasopressors  CC  follow up respiratory failure  SUBJECTIVE Patient remains critically ill Prognosis is guarded 3 Pressors On CRRT    BMP Latest Ref Rng & Units 03/16/2020 03/16/2020 03/16/2020  Glucose 70 - 99 mg/dL 246(H) 215(H) 214(H)  BUN 6 - 20 mg/dL 52(H) 51(H) 46(H)  Creatinine 0.44 - 1.00 mg/dL 4.35(H) 4.47(H) 3.67(H)  BUN/Creat Ratio 9 - 23 - - -  Sodium 135 - 145 mmol/L 144 145 147(H)  Potassium 3.5 - 5.1 mmol/L 5.5(H) 5.3(H) 5.8(H)  Chloride 98 - 111 mmol/L 106 107 110  CO2 22 - 32 mmol/L 21(L) 22 21(L)  Calcium 8.9 - 10.3 mg/dL 7.1(L) 7.3(L) 7.0(L)       Vent Mode: PRVC FiO2 (%):  [100 %] 100 % Set Rate:  [30 bmp] 30 bmp Vt Set:  [480 mL] 480 mL PEEP:  [12 cmH20-14 cmH20] 14 cmH20 Plateau Pressure:  [29 cmH20-32 cmH20] 32 cmH20  BP (!) 90/24   Pulse (!) 127   Temp 99.5 F (37.5 C)   Resp (!) 30   Ht 5\' 1"  (1.549 m)   Wt 105.6 kg   LMP 03/09/2020   SpO2 95%   BMI 43.99 kg/m    I/O last 3 completed shifts: In: 7809.6 [I.V.:5984.5; Other:210; NG/GT:150; IV Piggyback:1465] Out: 2252 [Urine:421; Other:1831] No intake/output data recorded.  SpO2: 95 % O2 Flow Rate (L/min): 50 L/min FiO2 (%): 100 %  Estimated body mass index is 43.99 kg/m as calculated from the following:   Height as of this encounter: 5\' 1"  (1.549 m).   Weight as of this encounter: 105.6 kg.  SIGNIFICANT EVENTS   REVIEW OF SYSTEMS  PATIENT IS UNABLE TO PROVIDE COMPLETE REVIEW OF SYSTEMS DUE TO SEVERE CRITICAL ILLNESS        PHYSICAL EXAMINATION:  GENERAL:critically ill appearing, +resp distress HEAD: Normocephalic, atraumatic.  EYES: Pupils equal, round, reactive to light.  No scleral icterus.  MOUTH: Moist mucosal membrane. NECK:  Supple.  PULMONARY: +rhonchi, +wheezing CARDIOVASCULAR: S1 and S2. Regular rate and rhythm. No murmurs, rubs, or gallops.  GASTROINTESTINAL: Soft, nontender, -distended.  Positive bowel sounds.   MUSCULOSKELETAL: + edema.  NEUROLOGIC: obtunded, GCS<8 SKIN:intact,warm,dry  MEDICATIONS: I have reviewed all medications and confirmed regimen as documented   CULTURE RESULTS   Recent Results (from the past 240 hour(s))  SARS Coronavirus 2 by RT PCR (hospital order, performed in [facility] hospital lab) Nasopharyngeal Nasopharyngeal Swab     Status: Abnormal   Collection Time: 03/16/2020  3:30 PM   Specimen: Nasopharyngeal Swab  Result Value Ref Range Status   SARS Coronavirus 2 POSITIVE (A) NEGATIVE Final    Comment: RESULT CALLED TO, READ BACK BY AND VERIFIED WITH: JENNIFER WHITLEY RN AT 0300 ON 03/22/2020 SNG (NOTE) SARS-CoV-2 target nucleic acids are DETECTED  SARS-CoV-2 RNA is generally detectable in upper respiratory specimens  during the acute phase of infection.  Positive results are indicative  of the presence of the identified virus, but do not rule out bacterial infection or co-infection with other pathogens not detected by the test.  Clinical correlation with patient history and  other diagnostic information is necessary to determine patient infection status.  The expected result is negative.  Fact Sheet for Patients:   StrictlyIdeas.no   Fact Sheet for Healthcare Providers:  BankingDealers.co.za    This test is not yet approved or cleared by the [place] and  has been authorized for detection and/or diagnosis of SARS-CoV-2 by FDA under an Emergency Use Authorization (EUA).  This EUA will remain in effect (meaning  this test can be used) for the duration of  the COVID-19 declaration under Section 564(b)(1) of the Act, 21 U.S.C. section 360-bbb-3(b)(1), unless the authorization is terminated or revoked  sooner.  Performed at [facility], [address] [place], [place] [address]   Blood culture (routine x 2)     Status: None   Collection Time: 03/22/2020  3:30 PM   Specimen: BLOOD  Result Value Ref Range Status   Specimen Description BLOOD BLOOD RIGHT ARM  Final   Special Requests   Final    BOTTLES DRAWN AEROBIC AND ANAEROBIC Blood Culture adequate volume   Culture   Final    NO GROWTH 5 DAYS Performed at [facility], [address] [place], [place] [address]    Report Status 03/12/2020 FINAL  Final  Blood culture (routine x 2)     Status: None   Collection Time: 03/09/2020  3:30 PM   Specimen: BLOOD  Result Value Ref Range Status   Specimen Description BLOOD BLOOD LEFT ARM  Final   Special Requests   Final    BOTTLES DRAWN AEROBIC AND ANAEROBIC Blood Culture adequate volume   Culture   Final    NO GROWTH 5 DAYS Performed at [facility], [address] [place], [place] [address]    Report Status 03/12/2020 FINAL  Final  MRSA PCR Screening     Status: None   Collection Time: 03/08/20  9:55 PM   Specimen: Nasal Mucosa; Nasopharyngeal  Result Value Ref Range Status   MRSA by PCR NEGATIVE NEGATIVE Final    Comment:        The GeneXpert MRSA Assay (FDA approved for NASAL specimens only), is one component of a comprehensive MRSA colonization surveillance program. It is not intended to diagnose MRSA infection nor to guide or monitor treatment for MRSA infections. Performed at [facility], [address] [place], [place] [address]           IMAGING    DG Chest Port 1 View  Result Date: 03/16/2020 CLINICAL DATA:  Endotracheal tube.  COVID positive. EXAM: PORTABLE CHEST 1 VIEW COMPARISON:  03/16/2020 FINDINGS: Endotracheal tube in good position. NG tube in the stomach. Right jugular central venous catheter tip innominate vein unchanged. Right arm PICC tip SVC unchanged. Severe diffuse bilateral airspace disease most prominent  the right lung and left base. No pneumothorax. No effusion. No interval change. IMPRESSION: Severe bilateral airspace disease unchanged. Support tubes unchanged from earlier today. Electronically Signed   By: Franchot Gallo M.D.   On: 03/16/2020 17:41   DG Chest Port 1 View  Result Date: 03/16/2020 CLINICAL DATA:  Dialysis catheter insertion EXAM: PORTABLE CHEST 1 VIEW COMPARISON:  Portable exam 1155 hours compared 03/15/2020 FINDINGS: Tip of endotracheal tube projects 3.3 cm above carina. Nasogastric tube extends into stomach. RIGHT jugular catheter with tip projecting over SVC. RIGHT arm PICC line tip projecting over SVC. Normal heart size and mediastinal contours. Rotation to the LEFT. BILATERAL pulmonary infiltrates which could represent edema or infection. No pleural effusion or pneumothorax. IMPRESSION: No pneumothorax following RIGHT jugular line placement. Persistent BILATERAL pulmonary infiltrates question edema versus infection Electronically Signed   By: Lavonia Dana M.D.   On:  03/16/2020 14:02     Nutrition Status: Nutrition Problem: Increased nutrient needs Etiology: catabolic illness (COVID 19) Signs/Symptoms: estimated needs       Indwelling Urinary Catheter continued, requirement due to   Reason to continue Indwelling Urinary Catheter strict Intake/Output monitoring for hemodynamic instability   Central Line/ continued, requirement due to  Reason to continue [place] of central venous pressure or other hemodynamic parameters and poor IV access   Ventilator continued, requirement due to severe respiratory failure   Ventilator Sedation RASS 0 to -2      ASSESSMENT AND PLAN SYNOPSIS  Acute hypoxemic respiratory failure due to COVID-19 pneumonia / ARDS Mechanical ventilation via ARDS protocol, target PRVC 6 cc/kg Wean PEEP and FiO2 as able Goal plateau pressure less than 30, driving pressure less than 15 Paralytics if necessary for vent synchrony, gas  exchange Cycle prone positioning if necessary for oxygenation Deep sedation per PAD protocol, goal RASS -4, currently fentanyl, midazolam Diuresis as blood pressure and renal function can tolerate, goal CVP 5-8.   diuresis as tolerated based on Kidney function VAP prevention order set Remdesivir+ACTEMRA therapy IV steroids Follow inflammatory markers: Ferritin, D-dimer, CRP, IL-6, LDH Assess for possible tocilizumab  Vitamin C, zinc Plan to repeat and check resp cultures   Severe ACUTE Hypoxic and Hypercapnic Respiratory Failure -continue Full MV support -continue Bronchodilator Therapy -Wean Fio2 and PEEP as tolerated -VAP/VENT bundle implementation Severe hypoxia PF ratio 76  ACUTE DIASTOLIC CARDIAC FAILURE-   Morbid obesity, possible OSA.   Will certainly impact respiratory mechanics, ventilator weaning   ACUTE KIDNEY INJURY/Renal Failure -continue Foley Catheter-assess need -Avoid nephrotoxic agents -Follow urine output, BMP -Ensure adequate renal perfusion, optimize oxygenation -Renal dose medications On CRRT     NEUROLOGY Acute toxic metabolic encephalopathy, need for sedation Goal RASS -2 to -3  SHOCK-SEPSIS/HYPOVOLUMIC -use  3 vasopressors to keep MAP>65 -follow ABG and LA -follow up cultures -emperic ABX -stress dose steroids   CARDIAC ICU monitoring  ID -continue IV abx as prescibed -follow up cultures  GI GI PROPHYLAXIS as indicated   DIET-->NPO Constipation protocol as indicated  ENDO - will use ICU hypoglycemic\Hyperglycemia protocol if indicated     ELECTROLYTES -follow labs as needed -replace as needed -pharmacy consultation and following   DVT/GI PRX ordered and assessed TRANSFUSIONS AS NEEDED MONITOR FSBS I Assessed the need for Labs I Assessed the need for Foley I Assessed the need for Central Venous Line Family Discussion when available I Assessed the need for Mobilization I made an Assessment of medications to be  adjusted accordingly Safety Risk assessment completed   CASE DISCUSSED IN MULTIDISCIPLINARY ROUNDS WITH ICU TEAM  Critical Care Time devoted to patient care services described in this note is 32 minutes.   Overall, patient is critically ill, prognosis is guarded.  Patient with Multiorgan failure and at high risk for cardiac arrest and death.    Corrin Parker, M.D.  [facility]  Medical Director [place] Director [facility] Cardio-Pulmonary Department

## 2020-03-30 NOTE — Progress Notes (Signed)
Notified Dr. Mortimer Fries patients MAP 35 with vasopressors infusing. Orders given for bicarb. No additional orders continue to assess.

## 2020-03-30 NOTE — Progress Notes (Signed)
Patients daughter requested to speak with Dr. Mortimer Fries

## 2020-03-30 NOTE — Progress Notes (Signed)
CDS notified of TOD. Backup service answered, and will report to our local donor service. Reported to charge RN [place], and requested to pass the info along to bedside RN Katharine Look.

## 2020-03-30 NOTE — Progress Notes (Signed)
Inpatient Diabetes Program Recommendations  AACE/ADA: New Consensus Statement on Inpatient Glycemic Control (2015)  Target Ranges:  Prepandial:   less than 140 mg/dL      Peak postprandial:   less than 180 mg/dL (1-2 hours)      Critically ill patients:  140 - 180 mg/dL   Results for PRISCILA, BEAN (MRN [id number]) as of March 20, 2020 07:29  Ref. Range 03/15/2020 23:46 03/16/2020 03:34 03/16/2020 07:53 03/16/2020 12:46 03/16/2020 16:16 03/16/2020 19:44  Glucose-Capillary Latest Ref Range: 70 - 99 mg/dL 202 (H) 183 (H) 203 (H) 207 (H) 212 (H) 142 (H)   Results for MICHIE, MOLNAR (MRN [id number]) as of 2020/03/20 07:29  Ref. Range 03/16/2020 23:26 03/16/2020 23:49 2020-03-20 03:38 03/20/2020 03:57  Glucose-Capillary Latest Ref Range: 70 - 99 mg/dL 69 (L) 135 (H) 37 (LL) 107 (H)    Current Orders: Levemir 10 units BID        Novolog Resistant Correction Scale/ SSI (0-20 units) Q4 hours    MD- Note pt had some minor CBG elevations on 08/18 (low 200s range).  Levemir dose increased to 10 units BID and then pt now having issues with Hypoglycemia--Hypoglycemia could be due to worsened renal function  Remains NPO at this point  If Tube feeds are added today, may leave Insulin orders at current doses.  If Tube Feeds Not added today, please consider the following reductions:  1. Reduce Levemir to 7 units BID (0.15 units/kg)  2. Reduce Novolog SSI to the Moderate scale (0-15 units) Q4 hours    --Will follow patient during hospitalization--  Wyn Quaker RN, MSN, CDE Diabetes Coordinator Inpatient Glycemic Control Team Team Pager: [phone] (8a-5p)

## 2020-03-30 NOTE — Progress Notes (Signed)
The Clinical status was relayed to family in detail. Husband Elta [place] was asked to come vivist patient ASAP Patient near cardiac arrest  The Husband has asked for therapy with Ivermectin and Hydroxychloroquine I have explained that these medications can cause more harm than good, patient is toxic and adding these meds will not cure her underlying disease. Will start these meds as per Husband request   Updated and notified of patients medical condition. patient with increased WOB and using accessory muscles to breathe Explained to family course of therapy and the modalities     Patient with Progressive multiorgan failure with very low chance of meaningful recovery despite all aggressive and optimal medical therapy.  Patient is in the dying  Process associated with suffering.  Family understands the situation.   Family are satisfied with Plan of action and management. All questions answered     Corrin Parker, M.D.  [facility]  Medical Director [place] Director [facility] Cardio-Pulmonary Department

## 2020-03-30 DEATH — deceased

## 2022-08-15 IMAGING — DX DG ABDOMEN 1V
1 series · 1 of 1 positions shown · non-contrast
Comparison: None.

CLINICAL DATA: Orogastric tube placement

EXAM:
ABDOMEN - 1 VIEW

[abdomen supine]
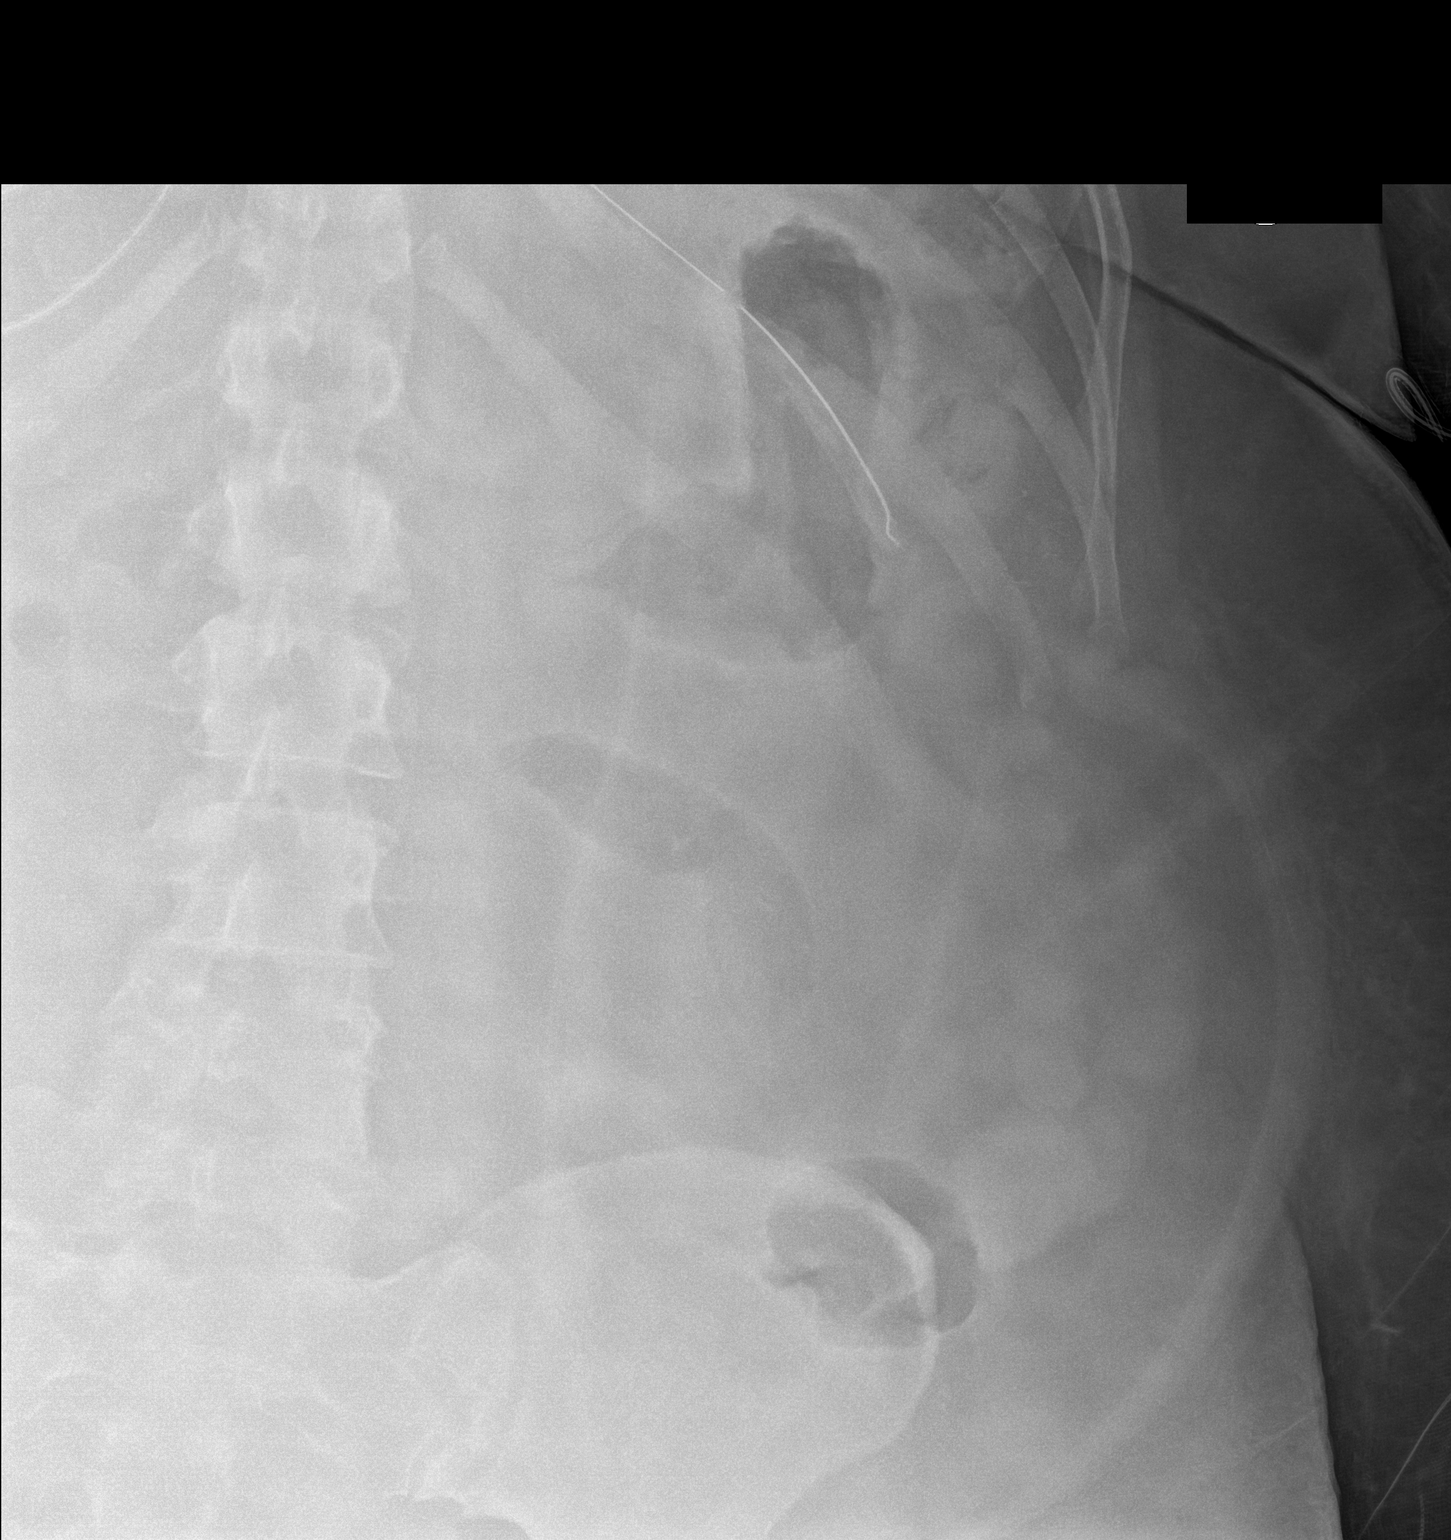

[1 of 1 positions shown; findings below may reference images not displayed]

FINDINGS: Tip and side port of the orogastric tube project within the stomach.
The stomach is no longer distended with gas.
IMPRESSION: Orogastric tube tip in the stomach.

## 2022-08-16 IMAGING — DX DG CHEST 1V PORT
1 series · 1 of 1 positions shown · non-contrast
Comparison: Portable exam 0011 hours compared 03/15/2020

CLINICAL DATA: Dialysis catheter insertion

EXAM:
PORTABLE CHEST 1 VIEW

[chest ap]
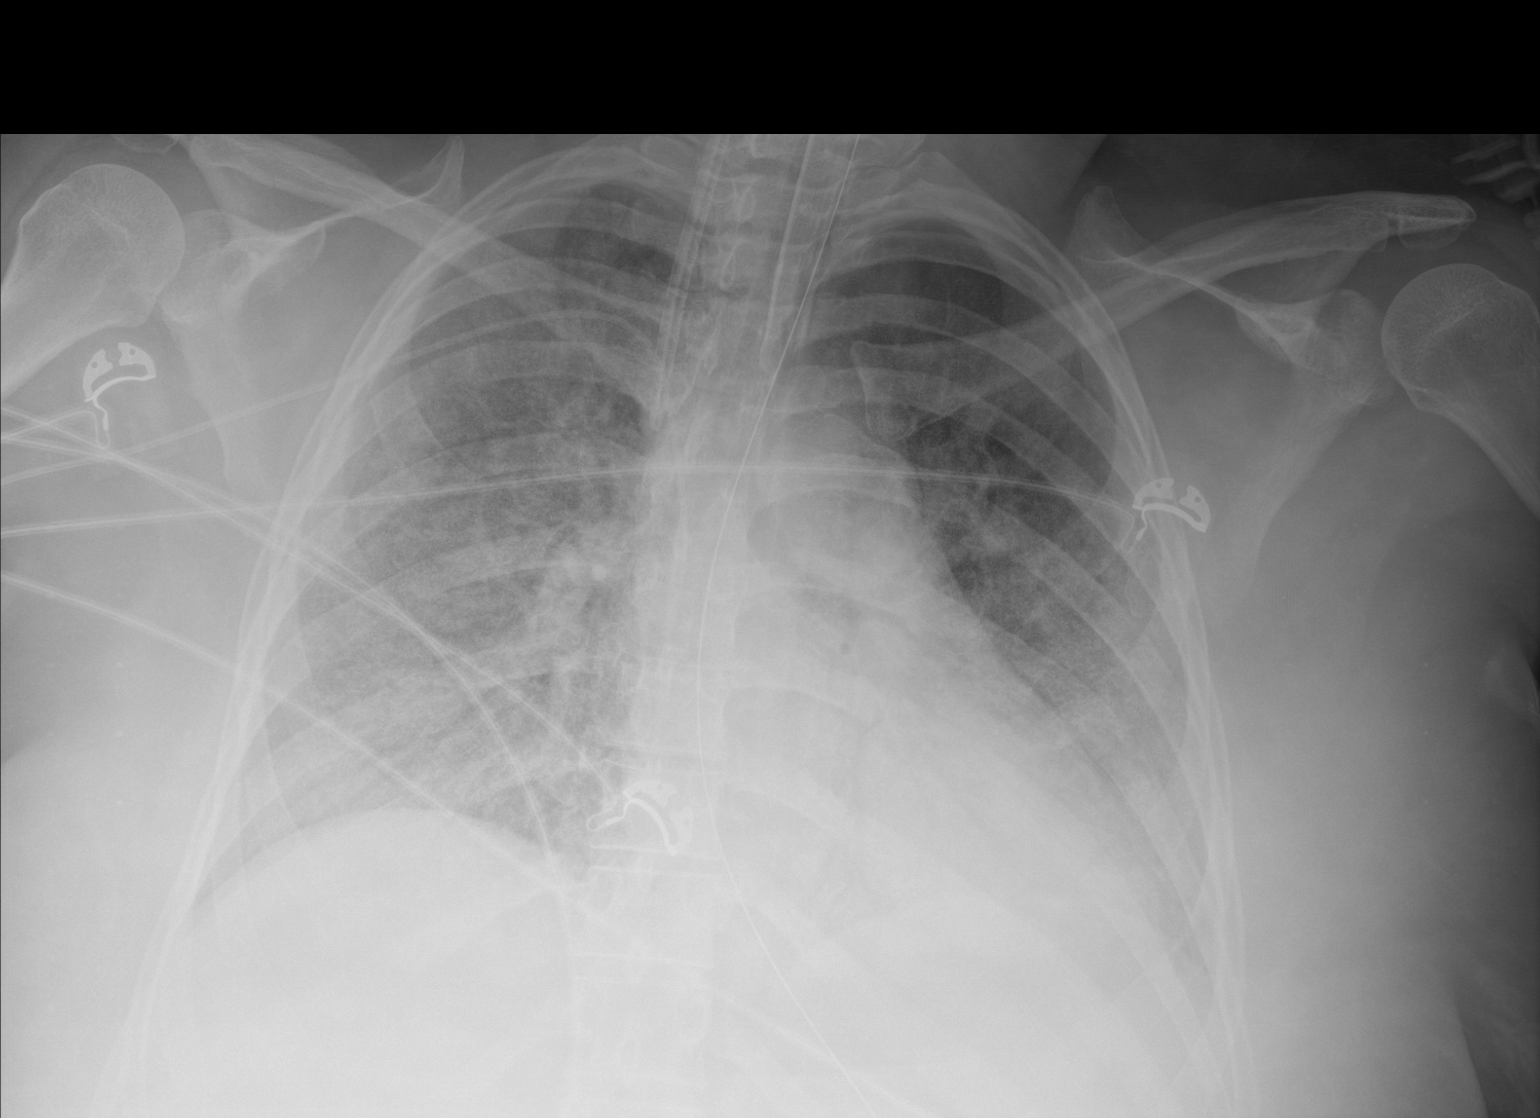

[1 of 1 positions shown; findings below may reference images not displayed]

FINDINGS: Tip of endotracheal tube projects 3.3 cm above carina.

Nasogastric tube extends into stomach.

RIGHT jugular catheter with tip projecting over SVC.

RIGHT arm PICC line tip projecting over SVC.

Normal heart size and mediastinal contours.

Rotation to the LEFT.

BILATERAL pulmonary infiltrates which could represent edema or
infection.

No pleural effusion or pneumothorax.
IMPRESSION: No pneumothorax following RIGHT jugular line placement.

Persistent BILATERAL pulmonary infiltrates question edema versus
infection
